# Patient Record
Sex: Male | Born: 1945
Health system: Southern US, Community
[De-identification: ages and names within clinical notes are randomized; demographics above are authoritative.]

## PROBLEM LIST (undated history)

## (undated) DIAGNOSIS — I10 Essential (primary) hypertension: Secondary | ICD-10-CM

## (undated) DIAGNOSIS — F319 Bipolar disorder, unspecified: Secondary | ICD-10-CM

## (undated) DIAGNOSIS — G252 Other specified forms of tremor: Secondary | ICD-10-CM

## (undated) HISTORY — DX: Essential (primary) hypertension: I10

## (undated) HISTORY — PX: TONSILECTOMY/ADENOIDECTOMY WITH MYRINGOTOMY: SHX6125

## (undated) HISTORY — DX: Bipolar disorder, unspecified: F31.9

---

## 2012-01-15 DIAGNOSIS — Z0000 Encounter for general adult medical examination without abnormal findings: Secondary | ICD-10-CM | POA: Diagnosis not present

## 2012-01-15 DIAGNOSIS — I10 Essential (primary) hypertension: Secondary | ICD-10-CM | POA: Diagnosis not present

## 2012-01-15 DIAGNOSIS — Z23 Encounter for immunization: Secondary | ICD-10-CM | POA: Diagnosis not present

## 2012-01-15 DIAGNOSIS — F17200 Nicotine dependence, unspecified, uncomplicated: Secondary | ICD-10-CM | POA: Diagnosis not present

## 2012-01-28 ENCOUNTER — Ambulatory Visit: Payer: Self-pay | Admitting: Family Medicine

## 2012-01-28 DIAGNOSIS — F17200 Nicotine dependence, unspecified, uncomplicated: Secondary | ICD-10-CM | POA: Diagnosis not present

## 2012-01-28 DIAGNOSIS — Z0000 Encounter for general adult medical examination without abnormal findings: Secondary | ICD-10-CM | POA: Diagnosis not present

## 2012-01-28 DIAGNOSIS — I700 Atherosclerosis of aorta: Secondary | ICD-10-CM | POA: Diagnosis not present

## 2012-01-28 DIAGNOSIS — Z136 Encounter for screening for cardiovascular disorders: Secondary | ICD-10-CM | POA: Diagnosis not present

## 2012-07-29 DIAGNOSIS — F3113 Bipolar disorder, current episode manic without psychotic features, severe: Secondary | ICD-10-CM | POA: Diagnosis not present

## 2012-08-13 DIAGNOSIS — Z79899 Other long term (current) drug therapy: Secondary | ICD-10-CM | POA: Diagnosis not present

## 2012-08-13 DIAGNOSIS — F3132 Bipolar disorder, current episode depressed, moderate: Secondary | ICD-10-CM | POA: Diagnosis not present

## 2012-12-14 DIAGNOSIS — Z79899 Other long term (current) drug therapy: Secondary | ICD-10-CM | POA: Diagnosis not present

## 2012-12-30 DIAGNOSIS — F3113 Bipolar disorder, current episode manic without psychotic features, severe: Secondary | ICD-10-CM | POA: Diagnosis not present

## 2013-01-18 DIAGNOSIS — I10 Essential (primary) hypertension: Secondary | ICD-10-CM | POA: Diagnosis not present

## 2013-01-18 DIAGNOSIS — Z0000 Encounter for general adult medical examination without abnormal findings: Secondary | ICD-10-CM | POA: Diagnosis not present

## 2013-01-18 DIAGNOSIS — K047 Periapical abscess without sinus: Secondary | ICD-10-CM | POA: Diagnosis not present

## 2013-01-18 DIAGNOSIS — Z23 Encounter for immunization: Secondary | ICD-10-CM | POA: Diagnosis not present

## 2013-01-18 DIAGNOSIS — Z125 Encounter for screening for malignant neoplasm of prostate: Secondary | ICD-10-CM | POA: Diagnosis not present

## 2013-03-24 DIAGNOSIS — F3113 Bipolar disorder, current episode manic without psychotic features, severe: Secondary | ICD-10-CM | POA: Diagnosis not present

## 2013-07-01 DIAGNOSIS — F3113 Bipolar disorder, current episode manic without psychotic features, severe: Secondary | ICD-10-CM | POA: Diagnosis not present

## 2013-07-21 DIAGNOSIS — I10 Essential (primary) hypertension: Secondary | ICD-10-CM | POA: Diagnosis not present

## 2013-12-29 DIAGNOSIS — Z79899 Other long term (current) drug therapy: Secondary | ICD-10-CM | POA: Diagnosis not present

## 2014-01-13 DIAGNOSIS — F3113 Bipolar disorder, current episode manic without psychotic features, severe: Secondary | ICD-10-CM | POA: Diagnosis not present

## 2014-02-16 DIAGNOSIS — F17210 Nicotine dependence, cigarettes, uncomplicated: Secondary | ICD-10-CM | POA: Diagnosis not present

## 2014-02-16 DIAGNOSIS — Z125 Encounter for screening for malignant neoplasm of prostate: Secondary | ICD-10-CM | POA: Diagnosis not present

## 2014-02-16 DIAGNOSIS — F319 Bipolar disorder, unspecified: Secondary | ICD-10-CM | POA: Diagnosis not present

## 2014-02-16 DIAGNOSIS — I10 Essential (primary) hypertension: Secondary | ICD-10-CM | POA: Diagnosis not present

## 2014-02-16 DIAGNOSIS — K4090 Unilateral inguinal hernia, without obstruction or gangrene, not specified as recurrent: Secondary | ICD-10-CM | POA: Diagnosis not present

## 2014-02-16 DIAGNOSIS — Z23 Encounter for immunization: Secondary | ICD-10-CM | POA: Diagnosis not present

## 2014-02-16 DIAGNOSIS — Z0000 Encounter for general adult medical examination without abnormal findings: Secondary | ICD-10-CM | POA: Diagnosis not present

## 2014-04-14 DIAGNOSIS — F3113 Bipolar disorder, current episode manic without psychotic features, severe: Secondary | ICD-10-CM | POA: Diagnosis not present

## 2014-07-14 DIAGNOSIS — F3113 Bipolar disorder, current episode manic without psychotic features, severe: Secondary | ICD-10-CM | POA: Diagnosis not present

## 2014-09-07 ENCOUNTER — Encounter: Payer: Self-pay | Admitting: Family Medicine

## 2014-09-07 ENCOUNTER — Ambulatory Visit (INDEPENDENT_AMBULATORY_CARE_PROVIDER_SITE_OTHER): Payer: Medicare Other | Admitting: Family Medicine

## 2014-09-07 VITALS — BP 146/91 | HR 83 | Temp 99.9°F | Ht 66.0 in | Wt 136.0 lb

## 2014-09-07 DIAGNOSIS — N182 Chronic kidney disease, stage 2 (mild): Secondary | ICD-10-CM

## 2014-09-07 DIAGNOSIS — N183 Chronic kidney disease, stage 3 unspecified: Secondary | ICD-10-CM | POA: Insufficient documentation

## 2014-09-07 DIAGNOSIS — F3181 Bipolar II disorder: Secondary | ICD-10-CM

## 2014-09-07 DIAGNOSIS — I10 Essential (primary) hypertension: Secondary | ICD-10-CM | POA: Diagnosis not present

## 2014-09-07 MED ORDER — LISINOPRIL 10 MG PO TABS: 10.0000 mg | ORAL_TABLET | Freq: Every day | ORAL | Status: DC

## 2014-09-07 NOTE — Assessment & Plan Note (Signed)
Check BMP .  

## 2014-09-07 NOTE — Progress Notes (Signed)
   BP 146/91 mmHg  Pulse 83  Temp(Src) 99.9 F (37.7 C) (Oral)  Ht 5\' 6"  (1.676 m)  Wt 136 lb (61.689 kg)  BMI 21.96 kg/m2   Subjective:    Patient ID: Tony Fowler, male    DOB: 02/10/1946, 69 y.o.   MRN: [id number]  HPI: Tony CassisHerbert Cortina is a 69 y.o. male presenting on 09/07/2014 for Follow-up; Hypertension; and Chronic Kidney Disease   Hypertension This is a chronic problem. The problem is uncontrolled. Pertinent negatives include no headaches, palpitations, peripheral edema or shortness of breath. Associated agents: worse when smokes.   CKD no c/o doing well takes lithium and bipolar stable followed by psy who does lithium levels  Relevant past medical, surgical, family and social history reviewed and updated as indicated. Interim medical history since our last visit reviewed. Allergies and medications reviewed and updated.  Current Outpatient Prescriptions on File Prior to Visit  Medication Sig  . lisinopril (PRINIVIL,ZESTRIL) 5 MG tablet Take 5 mg by mouth daily.  Marland Kitchen. lithium carbonate 300 MG capsule Take 300 mg by mouth 2 (two) times daily with a meal.  . Multiple Vitamin (MULTIVITAMIN) tablet Take 1 tablet by mouth daily.  . QUEtiapine (SEROQUEL) 100 MG tablet Take 100 mg by mouth at bedtime.   No current facility-administered medications on file prior to visit.    Review of Systems  Respiratory: Negative for shortness of breath.   Cardiovascular: Negative for palpitations.  Neurological: Negative for headaches.    Per HPI unless specifically indicated above     Objective:    BP 146/91 mmHg  Pulse 83  Temp(Src) 99.9 F (37.7 C) (Oral)  Ht 5\' 6"  (1.676 m)  Wt 136 lb (61.689 kg)  BMI 21.96 kg/m2  Wt Readings from Last 3 Encounters:  09/07/14 136 lb (61.689 kg)  02/16/14 134 lb (60.782 kg)    Physical Exam  Constitutional: He is oriented to person, place, and time. He appears well-developed and well-nourished.  Cardiovascular: Regular rhythm and normal heart  sounds.   Pulmonary/Chest: Breath sounds normal.  Neurological: He is alert and oriented to person, place, and time.  Psychiatric: He has a normal mood and affect. His behavior is normal. Judgment and thought content normal.  Vitals reviewed.   No results found for this or any previous visit.    Assessment & Plan:   Problem List Items Addressed This Visit      Cardiovascular and Mediastinum   Benign hypertension - Primary (Chronic)    Will increase lisinopril to 10mg  follow BP and if still high pt will call      Relevant Orders   Basic metabolic panel     Genitourinary   CKD (chronic kidney disease), stage II (Chronic)    Check BMP      Relevant Orders   Basic metabolic panel     Other   Bipolar 2 disorder (Chronic)      Smoking cessation instruction/counseling given  No orders of the defined types were placed in this encounter.    Follow up plan: Return in about 6 months (around 03/09/2015), or if symptoms worsen or fail to improve, for Physical Exam.

## 2014-09-07 NOTE — Assessment & Plan Note (Signed)
Will increase lisinopril to 10mg  follow BP and if still high pt will call

## 2014-09-08 ENCOUNTER — Telehealth: Payer: Self-pay | Admitting: Family Medicine

## 2014-09-08 LAB — BASIC METABOLIC PANEL
BUN / CREAT RATIO: 14 (ref 10–22)
BUN: 17 mg/dL (ref 8–27)
CHLORIDE: 102 mmol/L (ref 97–108)
CO2: 23 mmol/L (ref 18–29)
Calcium: 10 mg/dL (ref 8.6–10.2)
Creatinine, Ser: 1.24 mg/dL (ref 0.76–1.27)
GFR calc non Af Amer: 60 mL/min/{1.73_m2} (ref >59)
GFR, EST AFRICAN AMERICAN: 69 mL/min/{1.73_m2} (ref >59)
Glucose: 108 mg/dL — ABNORMAL HIGH (ref 65–99)
POTASSIUM: 4.8 mmol/L (ref 3.5–5.2)
SODIUM: 139 mmol/L (ref 134–144)

## 2014-09-08 NOTE — Telephone Encounter (Signed)
erroneous

## 2014-10-20 DIAGNOSIS — F331 Major depressive disorder, recurrent, moderate: Secondary | ICD-10-CM | POA: Diagnosis not present

## 2014-11-09 ENCOUNTER — Telehealth: Payer: Self-pay | Admitting: Family Medicine

## 2014-11-09 NOTE — Telephone Encounter (Signed)
bp was  Average 129/77 for the last 8 weeks and he wanted to let dr Dossie Arbour know his medication was working well.

## 2014-12-19 ENCOUNTER — Telehealth: Payer: Self-pay | Admitting: Family Medicine

## 2014-12-19 MED ORDER — LISINOPRIL 10 MG PO TABS: 10.0000 mg | ORAL_TABLET | Freq: Every day | ORAL | Status: DC

## 2014-12-19 NOTE — Telephone Encounter (Signed)
Rx sent to his pharmacy

## 2014-12-19 NOTE — Telephone Encounter (Signed)
Patient has CPE in November with MAC

## 2014-12-19 NOTE — Telephone Encounter (Signed)
Pt would like a refill on lisinorpil sent to [facility] 

## 2014-12-21 DIAGNOSIS — F3113 Bipolar disorder, current episode manic without psychotic features, severe: Secondary | ICD-10-CM | POA: Diagnosis not present

## 2015-01-19 DIAGNOSIS — F331 Major depressive disorder, recurrent, moderate: Secondary | ICD-10-CM | POA: Diagnosis not present

## 2015-02-22 ENCOUNTER — Ambulatory Visit (INDEPENDENT_AMBULATORY_CARE_PROVIDER_SITE_OTHER): Payer: Medicare Other | Admitting: Family Medicine

## 2015-02-22 ENCOUNTER — Encounter: Payer: Self-pay | Admitting: Family Medicine

## 2015-02-22 VITALS — BP 134/85 | HR 81 | Temp 98.2°F | Ht 66.6 in | Wt 136.0 lb

## 2015-02-22 DIAGNOSIS — Z0000 Encounter for general adult medical examination without abnormal findings: Secondary | ICD-10-CM

## 2015-02-22 DIAGNOSIS — Z23 Encounter for immunization: Secondary | ICD-10-CM

## 2015-02-22 DIAGNOSIS — N400 Enlarged prostate without lower urinary tract symptoms: Secondary | ICD-10-CM

## 2015-02-22 DIAGNOSIS — F319 Bipolar disorder, unspecified: Secondary | ICD-10-CM

## 2015-02-22 DIAGNOSIS — I10 Essential (primary) hypertension: Secondary | ICD-10-CM

## 2015-02-22 DIAGNOSIS — N138 Other obstructive and reflux uropathy: Secondary | ICD-10-CM | POA: Insufficient documentation

## 2015-02-22 DIAGNOSIS — N401 Enlarged prostate with lower urinary tract symptoms: Secondary | ICD-10-CM

## 2015-02-22 LAB — URINALYSIS, ROUTINE W REFLEX MICROSCOPIC
BILIRUBIN UA: NEGATIVE
Glucose, UA: NEGATIVE
Ketones, UA: NEGATIVE
Leukocytes, UA: NEGATIVE
NITRITE UA: NEGATIVE
PH UA: 6 (ref 5.0–7.5)
Protein, UA: NEGATIVE
RBC UA: NEGATIVE
UUROB: 0.2 mg/dL (ref 0.2–1.0)

## 2015-02-22 MED ORDER — LISINOPRIL 10 MG PO TABS: 10.0000 mg | ORAL_TABLET | Freq: Every day | ORAL | Status: DC

## 2015-02-22 NOTE — Assessment & Plan Note (Signed)
The current medical regimen is effective;  continue present plan and medications.  

## 2015-02-22 NOTE — Assessment & Plan Note (Signed)
Followed by psychiatry 

## 2015-02-22 NOTE — Progress Notes (Signed)
BP 134/85 mmHg  Pulse 81  Temp(Src) 98.2 F (36.8 C)  Ht 5' 6.6" (1.692 m)  Wt 136 lb (61.689 kg)  BMI 21.55 kg/m2  SpO2 99%   Subjective:    Patient ID: Tony Fowler, male    DOB: 06/28/1945, 69 y.o.   MRN: [id number]  HPI: Tony Fowler is a 69 y.o. male  Chief Complaint  Patient presents with  . Annual Exam   patient doing well nerves doing well managed by psychiatry, lithium levels were okay needs blood work to monitor renal function Taking lisinopril for blood pressure with good control no issues no side effects  Relevant past medical, surgical, family and social history reviewed and updated as indicated. Interim medical history since our last visit reviewed. Allergies and medications reviewed and updated.  Review of Systems  Constitutional: Negative.   HENT: Negative.   Eyes: Negative.   Respiratory: Negative.   Cardiovascular: Negative.   Gastrointestinal: Negative.   Endocrine: Negative.   Genitourinary: Negative.   Musculoskeletal: Negative.   Skin: Negative.   Allergic/Immunologic: Negative.   Neurological: Negative.   Hematological: Negative.   Psychiatric/Behavioral: Negative.     Per HPI unless specifically indicated above     Objective:    BP 134/85 mmHg  Pulse 81  Temp(Src) 98.2 F (36.8 C)  Ht 5' 6.6" (1.692 m)  Wt 136 lb (61.689 kg)  BMI 21.55 kg/m2  SpO2 99%  Wt Readings from Last 3 Encounters:  02/22/15 136 lb (61.689 kg)  09/07/14 136 lb (61.689 kg)  02/16/14 134 lb (60.782 kg)    Physical Exam  Constitutional: He is oriented to person, place, and time. He appears well-developed and well-nourished.  HENT:  Head: Normocephalic and atraumatic.  Right Ear: External ear normal.  Left Ear: External ear normal.  Eyes: Conjunctivae and EOM are normal. Pupils are equal, round, and reactive to light.  Neck: Normal range of motion. Neck supple.  Cardiovascular: Normal rate, regular rhythm, normal heart sounds and intact distal pulses.    Pulmonary/Chest: Effort normal and breath sounds normal.  Abdominal: Soft. Bowel sounds are normal. There is no splenomegaly or hepatomegaly.  Genitourinary: Rectum normal and penis normal.  Giant right inguinal hernia Enlarged prostate  Musculoskeletal: Normal range of motion.  Neurological: He is alert and oriented to person, place, and time. He has normal reflexes.  Skin: No rash noted. No erythema.  Psychiatric: He has a normal mood and affect. His behavior is normal. Judgment and thought content normal.    Results for orders placed or performed in visit on 09/07/14  Basic metabolic panel  Result Value Ref Range   Glucose 108 (H) 65 - 99 mg/dL   BUN 17 8 - 27 mg/dL   Creatinine, Ser 4.091.24 0.76 - 1.27 mg/dL   GFR calc non Af Amer 60 >59 mL/min/1.73   GFR calc Af Amer 69 >59 mL/min/1.73   BUN/Creatinine Ratio 14 10 - 22   Sodium 139 134 - 144 mmol/L   Potassium 4.8 3.5 - 5.2 mmol/L   Chloride 102 97 - 108 mmol/L   CO2 23 18 - 29 mmol/L   Calcium 10.0 8.6 - 10.2 mg/dL      Assessment & Plan:   Problem List Items Addressed This Visit      Cardiovascular and Mediastinum   Benign hypertension (Chronic)    The current medical regimen is effective;  continue present plan and medications.       Relevant Medications   lisinopril (PRINIVIL,ZESTRIL)  10 MG tablet   Other Relevant Orders   Comprehensive metabolic panel   Lipid panel   CBC with Differential/Platelet   TSH     Genitourinary   BPH (benign prostatic hyperplasia)   Relevant Orders   PSA   Urinalysis, Routine w reflex microscopic (not at [facility])   TSH     Other   Bipolar 1 disorder (HCC)    Followed by psychiatry      Relevant Orders   Comprehensive metabolic panel   Lipid panel   CBC with Differential/Platelet   TSH    Other Visit Diagnoses    Immunization due    -  Primary    Relevant Orders    Flu Vaccine QUAD 36+ mos PF IM (Fluarix & Fluzone Quad PF) (Completed)    Pneumococcal conjugate vaccine  13-valent IM (Completed)    PE (physical exam), annual            Follow up plan: Return in about 6 months (around 08/22/2015) for Medicine check, BMP, lithium level.

## 2015-02-23 ENCOUNTER — Telehealth: Payer: Self-pay | Admitting: Family Medicine

## 2015-02-23 ENCOUNTER — Encounter: Payer: Self-pay | Admitting: Family Medicine

## 2015-02-23 DIAGNOSIS — R972 Elevated prostate specific antigen [PSA]: Secondary | ICD-10-CM

## 2015-02-23 LAB — COMPREHENSIVE METABOLIC PANEL
A/G RATIO: 1.7 (ref 1.1–2.5)
ALT: 24 IU/L (ref 0–44)
AST: 27 IU/L (ref 0–40)
Albumin: 4.3 g/dL (ref 3.6–4.8)
Alkaline Phosphatase: 82 IU/L (ref 39–117)
BUN/Creatinine Ratio: 15 (ref 10–22)
BUN: 20 mg/dL (ref 8–27)
Bilirubin Total: 0.4 mg/dL (ref 0.0–1.2)
CALCIUM: 10 mg/dL (ref 8.6–10.2)
CO2: 23 mmol/L (ref 18–29)
Chloride: 101 mmol/L (ref 97–106)
Creatinine, Ser: 1.37 mg/dL — ABNORMAL HIGH (ref 0.76–1.27)
GFR calc Af Amer: 61 mL/min/{1.73_m2} (ref >59)
GFR, EST NON AFRICAN AMERICAN: 53 mL/min/{1.73_m2} — AB (ref >59)
Globulin, Total: 2.5 g/dL (ref 1.5–4.5)
Glucose: 101 mg/dL — ABNORMAL HIGH (ref 65–99)
Potassium: 4.8 mmol/L (ref 3.5–5.2)
Sodium: 137 mmol/L (ref 136–144)
TOTAL PROTEIN: 6.8 g/dL (ref 6.0–8.5)

## 2015-02-23 LAB — CBC WITH DIFFERENTIAL/PLATELET
Basophils Absolute: 0 10*3/uL (ref 0.0–0.2)
Basos: 0 %
EOS (ABSOLUTE): 0.3 10*3/uL (ref 0.0–0.4)
Eos: 3 %
Hematocrit: 43.7 % (ref 37.5–51.0)
Hemoglobin: 14.4 g/dL (ref 12.6–17.7)
Immature Grans (Abs): 0 10*3/uL (ref 0.0–0.1)
Immature Granulocytes: 0 %
Lymphocytes Absolute: 2.2 10*3/uL (ref 0.7–3.1)
Lymphs: 22 %
MCH: 31.3 pg (ref 26.6–33.0)
MCHC: 33 g/dL (ref 31.5–35.7)
MCV: 95 fL (ref 79–97)
Monocytes Absolute: 0.8 10*3/uL (ref 0.1–0.9)
Monocytes: 8 %
Neutrophils Absolute: 6.7 10*3/uL (ref 1.4–7.0)
Neutrophils: 67 %
Platelets: 279 10*3/uL (ref 150–379)
RBC: 4.6 x10E6/uL (ref 4.14–5.80)
RDW: 13 % (ref 12.3–15.4)
WBC: 10 10*3/uL (ref 3.4–10.8)

## 2015-02-23 LAB — LIPID PANEL
CHOL/HDL RATIO: 3.7 ratio (ref 0.0–5.0)
Cholesterol, Total: 205 mg/dL — ABNORMAL HIGH (ref 100–199)
HDL: 56 mg/dL (ref >39)
LDL CALC: 124 mg/dL — AB (ref 0–99)
Triglycerides: 125 mg/dL (ref 0–149)
VLDL Cholesterol Cal: 25 mg/dL (ref 5–40)

## 2015-02-23 LAB — TSH: TSH: 2.12 u[IU]/mL (ref 0.450–4.500)

## 2015-02-23 LAB — PSA: Prostate Specific Ag, Serum: 5.3 ng/mL — ABNORMAL HIGH (ref 0.0–4.0)

## 2015-02-23 NOTE — Telephone Encounter (Signed)
Phone call Discussed with patient elevated PSA Will recheck PSA 2 months or so if still elevated consider urology referral.

## 2015-02-23 NOTE — Telephone Encounter (Signed)
Pt would like a call back regarding lab results. 

## 2015-02-23 NOTE — Telephone Encounter (Signed)
-----   Message from Lurlean HornsNancy H Wilson, CMA sent at 02/23/2015  4:31 PM EST ----- labs

## 2015-04-20 DIAGNOSIS — F3113 Bipolar disorder, current episode manic without psychotic features, severe: Secondary | ICD-10-CM | POA: Diagnosis not present

## 2015-05-31 ENCOUNTER — Encounter: Payer: Self-pay | Admitting: Family Medicine

## 2015-05-31 ENCOUNTER — Ambulatory Visit (INDEPENDENT_AMBULATORY_CARE_PROVIDER_SITE_OTHER): Payer: Medicare Other | Admitting: Family Medicine

## 2015-05-31 VITALS — BP 116/75 | HR 85 | Temp 99.0°F | Ht 65.7 in | Wt 138.0 lb

## 2015-05-31 DIAGNOSIS — I10 Essential (primary) hypertension: Secondary | ICD-10-CM | POA: Diagnosis not present

## 2015-05-31 DIAGNOSIS — R972 Elevated prostate specific antigen [PSA]: Secondary | ICD-10-CM

## 2015-05-31 DIAGNOSIS — Z5181 Encounter for therapeutic drug level monitoring: Secondary | ICD-10-CM

## 2015-05-31 NOTE — Progress Notes (Signed)
BP 116/75 mmHg  Pulse 85  Temp(Src) 99 F (37.2 C)  Ht 5' 5.7" (1.669 m)  Wt 138 lb (62.596 kg)  BMI 22.47 kg/m2  SpO2 99%   Subjective:    Patient ID: Tony Fowler, male    DOB: 06/20/1945, 70 y.o.   MRN: [id number]  HPI: Tony Fowler is a 70 y.o. male  Chief Complaint  Patient presents with  . Hypertension  . medication monitoring    Lithium level  . recheck of PSA   patient doing well with no complaints from medications taking lisinopril without problems and blood pressure doing well. On review patient not do lithium level until May will cancel order Patient for also follow-up PSA which was elevated at physical patient with no lower urinary tract symptoms at all no BPH symptoms no blood in urine  Relevant past medical, surgical, family and social history reviewed and updated as indicated. Interim medical history since our last visit reviewed. Allergies and medications reviewed and updated.  Review of Systems  Constitutional: Negative.   Respiratory: Negative.   Cardiovascular: Negative.     Per HPI unless specifically indicated above     Objective:    BP 116/75 mmHg  Pulse 85  Temp(Src) 99 F (37.2 C)  Ht 5' 5.7" (1.669 m)  Wt 138 lb (62.596 kg)  BMI 22.47 kg/m2  SpO2 99%  Wt Readings from Last 3 Encounters:  05/31/15 138 lb (62.596 kg)  02/22/15 136 lb (61.689 kg)  09/07/14 136 lb (61.689 kg)    Physical Exam  Constitutional: He is oriented to person, place, and time. He appears well-developed and well-nourished. No distress.  HENT:  Head: Normocephalic and atraumatic.  Right Ear: Hearing normal.  Left Ear: Hearing normal.  Nose: Nose normal.  Eyes: Conjunctivae and lids are normal. Right eye exhibits no discharge. Left eye exhibits no discharge. No scleral icterus.  Cardiovascular: Normal rate, regular rhythm and normal heart sounds.   Pulmonary/Chest: Effort normal and breath sounds normal. No respiratory distress.  Musculoskeletal: Normal  range of motion.  Neurological: He is alert and oriented to person, place, and time.  Skin: Skin is intact. No rash noted.  Psychiatric: He has a normal mood and affect. His speech is normal and behavior is normal. Judgment and thought content normal. Cognition and memory are normal.    Results for orders placed or performed in visit on 02/22/15  Comprehensive metabolic panel  Result Value Ref Range   Glucose 101 (H) 65 - 99 mg/dL   BUN 20 8 - 27 mg/dL   Creatinine, Ser 4.09 (H) 0.76 - 1.27 mg/dL   GFR calc non Af Amer 53 (L) >59 mL/min/1.73   GFR calc Af Amer 61 >59 mL/min/1.73   BUN/Creatinine Ratio 15 10 - 22   Sodium 137 136 - 144 mmol/L   Potassium 4.8 3.5 - 5.2 mmol/L   Chloride 101 97 - 106 mmol/L   CO2 23 18 - 29 mmol/L   Calcium 10.0 8.6 - 10.2 mg/dL   Total Protein 6.8 6.0 - 8.5 g/dL   Albumin 4.3 3.6 - 4.8 g/dL   Globulin, Total 2.5 1.5 - 4.5 g/dL   Albumin/Globulin Ratio 1.7 1.1 - 2.5   Bilirubin Total 0.4 0.0 - 1.2 mg/dL   Alkaline Phosphatase 82 39 - 117 IU/L   AST 27 0 - 40 IU/L   ALT 24 0 - 44 IU/L  Lipid panel  Result Value Ref Range   Cholesterol, Total 205 (H) 100 -  199 mg/dL   Triglycerides 161 0 - 149 mg/dL   HDL 56 >09 mg/dL   VLDL Cholesterol Cal 25 5 - 40 mg/dL   LDL Calculated 604 (H) 0 - 99 mg/dL   Chol/HDL Ratio 3.7 0.0 - 5.0 ratio units  CBC with Differential/Platelet  Result Value Ref Range   WBC 10.0 3.4 - 10.8 x10E3/uL   RBC 4.60 4.14 - 5.80 x10E6/uL   Hemoglobin 14.4 12.6 - 17.7 g/dL   Hematocrit 54.0 98.1 - 51.0 %   MCV 95 79 - 97 fL   MCH 31.3 26.6 - 33.0 pg   MCHC 33.0 31.5 - 35.7 g/dL   RDW 19.1 47.8 - 29.5 %   Platelets 279 150 - 379 x10E3/uL   Neutrophils 67 %   Lymphs 22 %   Monocytes 8 %   Eos 3 %   Basos 0 %   Neutrophils Absolute 6.7 1.4 - 7.0 x10E3/uL   Lymphocytes Absolute 2.2 0.7 - 3.1 x10E3/uL   Monocytes Absolute 0.8 0.1 - 0.9 x10E3/uL   EOS (ABSOLUTE) 0.3 0.0 - 0.4 x10E3/uL   Basophils Absolute 0.0 0.0 - 0.2  x10E3/uL   Immature Granulocytes 0 %   Immature Grans (Abs) 0.0 0.0 - 0.1 x10E3/uL  PSA  Result Value Ref Range   Prostate Specific Ag, Serum 5.3 (H) 0.0 - 4.0 ng/mL  Urinalysis, Routine w reflex microscopic (not at [facility])  Result Value Ref Range   Specific Gravity, UA <1.005 (L) 1.005 - 1.030   pH, UA 6.0 5.0 - 7.5   Color, UA Yellow Yellow   Appearance Ur Clear Clear   Leukocytes, UA Negative Negative   Protein, UA Negative Negative/Trace   Glucose, UA Negative Negative   Ketones, UA Negative Negative   RBC, UA Negative Negative   Bilirubin, UA Negative Negative   Urobilinogen, Ur 0.2 0.2 - 1.0 mg/dL   Nitrite, UA Negative Negative  TSH  Result Value Ref Range   TSH 2.120 0.450 - 4.500 uIU/mL      Assessment & Plan:   Problem List Items Addressed This Visit      Cardiovascular and Mediastinum   Benign hypertension (Chronic)   Relevant Orders   Basic metabolic panel    Other Visit Diagnoses    Medication monitoring encounter    -  Primary    Elevated PSA            Follow up plan: Return for Physical Exam May and lithium level.

## 2015-06-01 ENCOUNTER — Encounter: Payer: Self-pay | Admitting: Family Medicine

## 2015-06-01 LAB — BASIC METABOLIC PANEL
BUN / CREAT RATIO: 16 (ref 10–22)
BUN: 22 mg/dL (ref 8–27)
CALCIUM: 9.9 mg/dL (ref 8.6–10.2)
CHLORIDE: 101 mmol/L (ref 96–106)
CO2: 23 mmol/L (ref 18–29)
Creatinine, Ser: 1.39 mg/dL — ABNORMAL HIGH (ref 0.76–1.27)
GFR, EST AFRICAN AMERICAN: 60 mL/min/{1.73_m2} (ref >59)
GFR, EST NON AFRICAN AMERICAN: 52 mL/min/{1.73_m2} — AB (ref >59)
Glucose: 102 mg/dL — ABNORMAL HIGH (ref 65–99)
POTASSIUM: 5.1 mmol/L (ref 3.5–5.2)
SODIUM: 138 mmol/L (ref 134–144)

## 2015-06-01 LAB — PSA: PROSTATE SPECIFIC AG, SERUM: 3.1 ng/mL (ref 0.0–4.0)

## 2015-07-13 DIAGNOSIS — F3113 Bipolar disorder, current episode manic without psychotic features, severe: Secondary | ICD-10-CM | POA: Diagnosis not present

## 2015-08-23 ENCOUNTER — Ambulatory Visit (INDEPENDENT_AMBULATORY_CARE_PROVIDER_SITE_OTHER): Payer: Medicare Other | Admitting: Family Medicine

## 2015-08-23 ENCOUNTER — Encounter: Payer: Self-pay | Admitting: Family Medicine

## 2015-08-23 VITALS — BP 137/80 | HR 86 | Temp 97.9°F | Ht 65.7 in | Wt 140.0 lb

## 2015-08-23 DIAGNOSIS — N183 Chronic kidney disease, stage 3 unspecified: Secondary | ICD-10-CM

## 2015-08-23 DIAGNOSIS — I10 Essential (primary) hypertension: Secondary | ICD-10-CM

## 2015-08-23 DIAGNOSIS — F319 Bipolar disorder, unspecified: Secondary | ICD-10-CM

## 2015-08-23 DIAGNOSIS — Z5181 Encounter for therapeutic drug level monitoring: Secondary | ICD-10-CM | POA: Diagnosis not present

## 2015-08-23 NOTE — Progress Notes (Signed)
BP 137/80 mmHg  Pulse 86  Temp(Src) 97.9 F (36.6 C)  Ht 5' 5.7" (1.669 m)  Wt 140 lb (63.504 kg)  BMI 22.80 kg/m2  SpO2 98%   Subjective:    Patient ID: Tony Fowler, male    DOB: Oct 31, 1945, 70 y.o.   MRN: [id number]  HPI: Tony Fowler is a 70 y.o. male  Chief Complaint  Patient presents with  . Manic Behavior  . Hypertension   Patient with bipolar 1 all in all doing well sleeping okay not cycling is looking at possibly reducing lithium because of's CK D and lithium and Seroquel managed by psychiatry. Needs to have limited BMP done because of concerns about Medicare payment and denial of labs. Wants to monitor CK D lithium levels and hypertension will limit testing to creatinine is at's been slowly creeping up. Also check lithium levels. Blood pressures been doing well no complaints from medications Relevant past medical, surgical, family and social history reviewed and updated as indicated. Interim medical history since our last visit reviewed. Allergies and medications reviewed and updated.  Review of Systems  Constitutional: Negative.   Respiratory: Negative.   Cardiovascular: Negative.     Per HPI unless specifically indicated above     Objective:    BP 137/80 mmHg  Pulse 86  Temp(Src) 97.9 F (36.6 C)  Ht 5' 5.7" (1.669 m)  Wt 140 lb (63.504 kg)  BMI 22.80 kg/m2  SpO2 98%  Wt Readings from Last 3 Encounters:  08/23/15 140 lb (63.504 kg)  05/31/15 138 lb (62.596 kg)  02/22/15 136 lb (61.689 kg)    Physical Exam  Constitutional: He is oriented to person, place, and time. He appears well-developed and well-nourished. No distress.  HENT:  Head: Normocephalic and atraumatic.  Right Ear: Hearing normal.  Left Ear: Hearing normal.  Nose: Nose normal.  Eyes: Conjunctivae and lids are normal. Right eye exhibits no discharge. Left eye exhibits no discharge. No scleral icterus.  Cardiovascular: Normal rate, regular rhythm and normal heart sounds.    Pulmonary/Chest: Effort normal and breath sounds normal. No respiratory distress.  Musculoskeletal: Normal range of motion.  Neurological: He is alert and oriented to person, place, and time.  Skin: Skin is intact. No rash noted.  Psychiatric: He has a normal mood and affect. His speech is normal and behavior is normal. Judgment and thought content normal. Cognition and memory are normal.    Results for orders placed or performed in visit on 05/31/15  PSA  Result Value Ref Range   Prostate Specific Ag, Serum 3.1 0.0 - 4.0 ng/mL  Basic metabolic panel  Result Value Ref Range   Glucose 102 (H) 65 - 99 mg/dL   BUN 22 8 - 27 mg/dL   Creatinine, Ser 2.95 (H) 0.76 - 1.27 mg/dL   GFR calc non Af Amer 52 (L) >59 mL/min/1.73   GFR calc Af Amer 60 >59 mL/min/1.73   BUN/Creatinine Ratio 16 10 - 22   Sodium 138 134 - 144 mmol/L   Potassium 5.1 3.5 - 5.2 mmol/L   Chloride 101 96 - 106 mmol/L   CO2 23 18 - 29 mmol/L   Calcium 9.9 8.6 - 10.2 mg/dL      Assessment & Plan:   Problem List Items Addressed This Visit      Cardiovascular and Mediastinum   Benign hypertension - Primary (Chronic)    The current medical regimen is effective;  continue present plan and medications.  Relevant Orders   Lithium level   Creatinine     Genitourinary   CKD (chronic kidney disease), stage III    Need to determine stability of renal function with nephrotoxic drugs and hypertension declining function.      Relevant Orders   Lithium level   Creatinine     Other   Bipolar 1 disorder (HCC)    Stable management psychiatry concerned about CK D needs therapeutic drug monitoring for both lithium and renal function      Relevant Orders   Lithium level   Creatinine    Other Visit Diagnoses    Medication monitoring encounter        Relevant Orders    Lithium level    Creatinine        Follow up plan: No Follow-up on file.

## 2015-08-23 NOTE — Assessment & Plan Note (Signed)
Need to determine stability of renal function with nephrotoxic drugs and hypertension declining function.

## 2015-08-23 NOTE — Assessment & Plan Note (Signed)
Stable management psychiatry concerned about CK D needs therapeutic drug monitoring for both lithium and renal function

## 2015-08-23 NOTE — Assessment & Plan Note (Signed)
The current medical regimen is effective;  continue present plan and medications.  

## 2015-08-24 ENCOUNTER — Telehealth: Payer: Self-pay | Admitting: Family Medicine

## 2015-08-24 DIAGNOSIS — N183 Chronic kidney disease, stage 3 unspecified: Secondary | ICD-10-CM

## 2015-08-24 LAB — CREATININE, SERUM
CREATININE: 1.49 mg/dL — AB (ref 0.76–1.27)
GFR, EST AFRICAN AMERICAN: 55 mL/min/{1.73_m2} — AB (ref >59)
GFR, EST NON AFRICAN AMERICAN: 48 mL/min/{1.73_m2} — AB (ref >59)

## 2015-08-24 LAB — LITHIUM LEVEL: LITHIUM LVL: 0.8 mmol/L (ref 0.6–1.2)

## 2015-08-24 NOTE — Telephone Encounter (Signed)
Phone call Discussed with patient lithium levels therapeutic but declining renal function and developing CK D. Will need to refer to nephrology for preservation of renal status. Patient will discuss with his psychiatrist about lithium.

## 2015-09-27 DIAGNOSIS — I10 Essential (primary) hypertension: Secondary | ICD-10-CM | POA: Diagnosis not present

## 2015-09-27 DIAGNOSIS — N183 Chronic kidney disease, stage 3 (moderate): Secondary | ICD-10-CM | POA: Diagnosis not present

## 2015-10-12 DIAGNOSIS — F3113 Bipolar disorder, current episode manic without psychotic features, severe: Secondary | ICD-10-CM | POA: Diagnosis not present

## 2015-11-13 ENCOUNTER — Telehealth: Payer: Self-pay | Admitting: Family Medicine

## 2015-11-13 ENCOUNTER — Other Ambulatory Visit: Payer: Self-pay | Admitting: Family Medicine

## 2015-11-13 MED ORDER — QUETIAPINE FUMARATE 300 MG PO TABS: 300.0000 mg | ORAL_TABLET | Freq: Every day | ORAL | 2 refills | Status: DC

## 2015-11-13 NOTE — Telephone Encounter (Signed)
Pt's daughter called stated she is really worried about her dad. Stated to please call her ASAP. Thanks.

## 2015-11-13 NOTE — Telephone Encounter (Signed)
Pts daughter called stating [name] has taken himself off of one of his psyche meds and is not doing so well.  She would like to talk to Dr Dossie Arbourrissman about what can be done.

## 2015-11-13 NOTE — Telephone Encounter (Signed)
Phone call with daughter who is caregiver Patient with concern about renal status has stopped lithium and has deteriorated into bipolar manic stage and is getting more and more agitated spending money and getting very agitated and combative. Family is trying to avoid commitment. Discuss increasing Seroquel to 300 mg every 3 days Psychiatric consult as soon as possible In follow-up if getting worse.

## 2015-11-16 DIAGNOSIS — F3113 Bipolar disorder, current episode manic without psychotic features, severe: Secondary | ICD-10-CM | POA: Diagnosis not present

## 2015-11-22 DIAGNOSIS — F3113 Bipolar disorder, current episode manic without psychotic features, severe: Secondary | ICD-10-CM | POA: Diagnosis not present

## 2015-12-01 ENCOUNTER — Encounter: Payer: Self-pay | Admitting: Emergency Medicine

## 2015-12-01 ENCOUNTER — Encounter: Payer: Self-pay | Admitting: Psychiatry

## 2015-12-01 ENCOUNTER — Inpatient Hospital Stay
Admission: RE | Admit: 2015-12-01 | Discharge: 2015-12-14 | DRG: 885 | Disposition: A | Discharge status: Home / Self Care | Payer: Medicare Other | Source: Intra-hospital | Attending: Psychiatry | Admitting: Psychiatry

## 2015-12-01 ENCOUNTER — Emergency Department
Admission: EM | Admit: 2015-12-01 | Discharge: 2015-12-01 | Disposition: A | Discharge status: Other Acute Inpatient Hospital | Payer: Medicare Other | Attending: Emergency Medicine | Admitting: Emergency Medicine

## 2015-12-01 DIAGNOSIS — N183 Chronic kidney disease, stage 3 unspecified: Secondary | ICD-10-CM | POA: Diagnosis present

## 2015-12-01 DIAGNOSIS — F17210 Nicotine dependence, cigarettes, uncomplicated: Secondary | ICD-10-CM | POA: Diagnosis present

## 2015-12-01 DIAGNOSIS — F3112 Bipolar disorder, current episode manic without psychotic features, moderate: Secondary | ICD-10-CM | POA: Diagnosis present

## 2015-12-01 DIAGNOSIS — F17200 Nicotine dependence, unspecified, uncomplicated: Secondary | ICD-10-CM | POA: Diagnosis present

## 2015-12-01 DIAGNOSIS — Z046 Encounter for general psychiatric examination, requested by authority: Secondary | ICD-10-CM | POA: Diagnosis present

## 2015-12-01 DIAGNOSIS — F3110 Bipolar disorder, current episode manic without psychotic features, unspecified: Secondary | ICD-10-CM | POA: Diagnosis not present

## 2015-12-01 DIAGNOSIS — Z9114 Patient's other noncompliance with medication regimen: Secondary | ICD-10-CM | POA: Diagnosis not present

## 2015-12-01 DIAGNOSIS — F3162 Bipolar disorder, current episode mixed, moderate: Secondary | ICD-10-CM | POA: Diagnosis not present

## 2015-12-01 DIAGNOSIS — Z9889 Other specified postprocedural states: Secondary | ICD-10-CM

## 2015-12-01 DIAGNOSIS — Z818 Family history of other mental and behavioral disorders: Secondary | ICD-10-CM

## 2015-12-01 DIAGNOSIS — I129 Hypertensive chronic kidney disease with stage 1 through stage 4 chronic kidney disease, or unspecified chronic kidney disease: Secondary | ICD-10-CM | POA: Insufficient documentation

## 2015-12-01 DIAGNOSIS — F312 Bipolar disorder, current episode manic severe with psychotic features: Secondary | ICD-10-CM | POA: Diagnosis not present

## 2015-12-01 DIAGNOSIS — Z79899 Other long term (current) drug therapy: Secondary | ICD-10-CM | POA: Diagnosis not present

## 2015-12-01 DIAGNOSIS — Z880 Allergy status to penicillin: Secondary | ICD-10-CM

## 2015-12-01 DIAGNOSIS — Z8249 Family history of ischemic heart disease and other diseases of the circulatory system: Secondary | ICD-10-CM

## 2015-12-01 DIAGNOSIS — I10 Essential (primary) hypertension: Secondary | ICD-10-CM | POA: Diagnosis present

## 2015-12-01 DIAGNOSIS — Z01810 Encounter for preprocedural cardiovascular examination: Secondary | ICD-10-CM | POA: Diagnosis not present

## 2015-12-01 DIAGNOSIS — F319 Bipolar disorder, unspecified: Secondary | ICD-10-CM | POA: Diagnosis not present

## 2015-12-01 DIAGNOSIS — Z87891 Personal history of nicotine dependence: Secondary | ICD-10-CM | POA: Diagnosis present

## 2015-12-01 LAB — URINALYSIS COMPLETE WITH MICROSCOPIC (ARMC ONLY)
Bacteria, UA: NONE SEEN
Bilirubin Urine: NEGATIVE
Glucose, UA: NEGATIVE mg/dL
KETONES UR: NEGATIVE mg/dL
Nitrite: NEGATIVE
PH: 7 (ref 5.0–8.0)
PROTEIN: NEGATIVE mg/dL
SPECIFIC GRAVITY, URINE: 1.008 (ref 1.005–1.030)
SQUAMOUS EPITHELIAL / LPF: NONE SEEN

## 2015-12-01 LAB — VALPROIC ACID LEVEL: Valproic Acid Lvl: 65 ug/mL (ref 50.0–100.0)

## 2015-12-01 LAB — CBC
HEMATOCRIT: 42.2 % (ref 40.0–52.0)
HEMOGLOBIN: 14.6 g/dL (ref 13.0–18.0)
MCH: 31.9 pg (ref 26.0–34.0)
MCHC: 34.6 g/dL (ref 32.0–36.0)
MCV: 92.1 fL (ref 80.0–100.0)
Platelets: 247 10*3/uL (ref 150–440)
RBC: 4.58 MIL/uL (ref 4.40–5.90)
RDW: 13.4 % (ref 11.5–14.5)
WBC: 12 10*3/uL — AB (ref 3.8–10.6)

## 2015-12-01 LAB — COMPREHENSIVE METABOLIC PANEL
ALBUMIN: 3.8 g/dL (ref 3.5–5.0)
ALT: 17 U/L (ref 17–63)
AST: 23 U/L (ref 15–41)
Alkaline Phosphatase: 64 U/L (ref 38–126)
Anion gap: 7 (ref 5–15)
BUN: 18 mg/dL (ref 6–20)
CHLORIDE: 106 mmol/L (ref 101–111)
CO2: 25 mmol/L (ref 22–32)
Calcium: 9.7 mg/dL (ref 8.9–10.3)
Creatinine, Ser: 1.21 mg/dL (ref 0.61–1.24)
GFR calc Af Amer: >60 mL/min (ref >60)
GFR, EST NON AFRICAN AMERICAN: 60 mL/min — AB (ref >60)
Glucose, Bld: 155 mg/dL — ABNORMAL HIGH (ref 65–99)
POTASSIUM: 4.8 mmol/L (ref 3.5–5.1)
SODIUM: 138 mmol/L (ref 135–145)
Total Bilirubin: 0.3 mg/dL (ref 0.3–1.2)
Total Protein: 6.8 g/dL (ref 6.5–8.1)

## 2015-12-01 LAB — LIPID PANEL
CHOLESTEROL: 191 mg/dL (ref 0–200)
HDL: 46 mg/dL (ref >40)
LDL Cholesterol: 107 mg/dL — ABNORMAL HIGH (ref 0–99)
TRIGLYCERIDES: 189 mg/dL — AB (ref <150)
Total CHOL/HDL Ratio: 4.2 RATIO
VLDL: 38 mg/dL (ref 0–40)

## 2015-12-01 LAB — TSH: TSH: 0.2 u[IU]/mL — ABNORMAL LOW (ref 0.350–4.500)

## 2015-12-01 LAB — URINE DRUG SCREEN, QUALITATIVE (ARMC ONLY)
AMPHETAMINES, UR SCREEN: NOT DETECTED
BENZODIAZEPINE, UR SCRN: NOT DETECTED
Barbiturates, Ur Screen: NOT DETECTED
CANNABINOID 50 NG, UR ~~LOC~~: NOT DETECTED
Cocaine Metabolite,Ur ~~LOC~~: NOT DETECTED
MDMA (Ecstasy)Ur Screen: NOT DETECTED
Methadone Scn, Ur: NOT DETECTED
Opiate, Ur Screen: NOT DETECTED
PHENCYCLIDINE (PCP) UR S: NOT DETECTED
Tricyclic, Ur Screen: NOT DETECTED

## 2015-12-01 LAB — ACETAMINOPHEN LEVEL

## 2015-12-01 LAB — SALICYLATE LEVEL: Salicylate Lvl: <4 mg/dL (ref 2.8–30.0)

## 2015-12-01 LAB — ETHANOL

## 2015-12-01 MED ORDER — ACETAMINOPHEN 325 MG PO TABS: 650.0000 mg | ORAL_TABLET | Freq: Four times a day (QID) | ORAL | Status: DC | PRN

## 2015-12-01 MED ORDER — LISINOPRIL 10 MG PO TABS
10.0000 mg | ORAL_TABLET | Freq: Every day | ORAL | Status: DC
  Administered 2015-12-01: 10 mg via ORAL
  Filled 2015-12-01: qty 1

## 2015-12-01 MED ORDER — NICOTINE 21 MG/24HR TD PT24
21.0000 mg | MEDICATED_PATCH | Freq: Once | TRANSDERMAL | Status: DC
  Administered 2015-12-01: 21 mg via TRANSDERMAL
  Filled 2015-12-01: qty 1

## 2015-12-01 MED ORDER — ALUM & MAG HYDROXIDE-SIMETH 200-200-20 MG/5ML PO SUSP: 30.0000 mL | ORAL | Status: DC | PRN

## 2015-12-01 MED ORDER — DIVALPROEX SODIUM 500 MG PO DR TAB
500.0000 mg | DELAYED_RELEASE_TABLET | Freq: Two times a day (BID) | ORAL | Status: DC
  Administered 2015-12-01 – 2015-12-05 (×8): 500 mg via ORAL
  Filled 2015-12-01 (×8): qty 1

## 2015-12-01 MED ORDER — QUETIAPINE FUMARATE 200 MG PO TABS
300.0000 mg | ORAL_TABLET | Freq: Every day | ORAL | Status: DC
  Administered 2015-12-01 – 2015-12-02 (×2): 300 mg via ORAL
  Filled 2015-12-01 (×2): qty 1

## 2015-12-01 MED ORDER — MAGNESIUM HYDROXIDE 400 MG/5ML PO SUSP: 30.0000 mL | Freq: Every day | ORAL | Status: DC | PRN

## 2015-12-01 MED ORDER — LITHIUM CARBONATE ER 450 MG PO TBCR: 450.0000 mg | EXTENDED_RELEASE_TABLET | Freq: Every day | ORAL | Status: DC

## 2015-12-01 MED ORDER — LISINOPRIL 10 MG PO TABS
10.0000 mg | ORAL_TABLET | Freq: Every day | ORAL | Status: DC
  Administered 2015-12-02: 10 mg via ORAL
  Filled 2015-12-01: qty 1

## 2015-12-01 MED ORDER — LITHIUM CARBONATE ER 450 MG PO TBCR
450.0000 mg | EXTENDED_RELEASE_TABLET | Freq: Every day | ORAL | Status: DC
Start: 2015-12-01 — End: 2015-12-05
  Administered 2015-12-01 – 2015-12-04 (×4): 450 mg via ORAL
  Filled 2015-12-01 (×4): qty 1

## 2015-12-01 MED ORDER — QUETIAPINE FUMARATE 25 MG PO TABS: 300.0000 mg | ORAL_TABLET | Freq: Every day | ORAL | Status: DC

## 2015-12-01 MED ORDER — DIVALPROEX SODIUM 500 MG PO DR TAB
500.0000 mg | DELAYED_RELEASE_TABLET | Freq: Two times a day (BID) | ORAL | Status: DC
  Administered 2015-12-01: 500 mg via ORAL
  Filled 2015-12-01: qty 1

## 2015-12-01 NOTE — ED Notes (Signed)
Patient in bathroom a this time

## 2015-12-01 NOTE — ED Notes (Signed)
IVC/Consult completed/pending placement 

## 2015-12-01 NOTE — ED Provider Notes (Signed)
[facility] Emergency Department Provider Note   ____________________________________________   First MD Initiated Contact with Patient 12/01/15 0104     (approximate)  I have reviewed the triage vital signs and the nursing notes.   HISTORY  Chief Complaint Medical Clearance    HPI Tony Fowler is a 70 y.o. male brought to the ED from home under IVC by police for threatening his daughters for not giving him cigarettes. Patient has a history of bipolar disorder and self-reported PTSD who arrives agitated and angry at his daughters. Denies active SI/HI/AH/VH. Voices no medical complaints. Denies recent fever, chills, chest pain, shortness of breath, abdominal pain, nausea, vomiting, diarrhea. Denies recent travel or trauma. Nothing makes his symptoms better or worse.   Past Medical History:  Diagnosis Date  . Bipolar 1 disorder [facility](HCC)     Patient Active Problem List   Diagnosis Date Noted  . Bipolar 1 disorder (HCC) 02/22/2015  . BPH (benign prostatic hyperplasia) 02/22/2015  . Benign hypertension 09/07/2014  . CKD (chronic kidney disease), stage III 09/07/2014    Past Surgical History:  Procedure Laterality Date  . TONSILECTOMY/ADENOIDECTOMY WITH MYRINGOTOMY      Prior to Admission medications   Medication Sig Start Date End Date Taking? Authorizing Provider  lisinopril (PRINIVIL,ZESTRIL) 10 MG tablet Take 1 tablet (10 mg total) by mouth daily. 02/22/15   Steele SizerMark A Crissman, MD  lithium carbonate 300 MG capsule Take 300 mg by mouth 2 (two) times daily with a meal.    Historical Provider, MD  Multiple Vitamin (MULTIVITAMIN) tablet Take 1 tablet by mouth daily.    Historical Provider, MD  QUEtiapine (SEROQUEL) 300 MG tablet Take 1 tablet (300 mg total) by mouth at bedtime. 11/13/15   Steele SizerMark A Crissman, MD    Allergies Penicillins  Family History  Problem Relation Age of Onset  . Heart disease Mother   . Hypertension Mother   . Mental illness  Mother   . Heart disease Brother   . Hypertension Brother     Social History Social History  Substance Use Topics  . Smoking status: Current Every Day Smoker    Packs/day: 0.75    Years: 0.00    Types: Cigarettes  . Smokeless tobacco: Never Used  . Alcohol use No    Review of Systems  Constitutional: No fever/chills. Eyes: No visual changes. ENT: No sore throat. Cardiovascular: Denies chest pain. Respiratory: Denies shortness of breath. Gastrointestinal: No abdominal pain.  No nausea, no vomiting.  No diarrhea.  No constipation. Genitourinary: Negative for dysuria. Musculoskeletal: Negative for back pain. Skin: Negative for rash. Neurological: Negative for headaches, focal weakness or numbness. Psychiatric:Positive for agitation.  10-point ROS otherwise negative.  ____________________________________________   PHYSICAL EXAM:  VITAL SIGNS: ED Triage Vitals  Enc Vitals Group     BP 12/01/15 0021 (!) 179/97     Pulse Rate 12/01/15 0021 (!) 108     Resp 12/01/15 0021 18     Temp 12/01/15 0021 98.3 F (36.8 C)     Temp Source 12/01/15 0021 Oral     SpO2 12/01/15 0021 97 %     Weight 12/01/15 0021 133 lb (60.3 kg)     Height 12/01/15 0021 5\' 7"  (1.702 m)     Head Circumference --      Peak Flow --      Pain Score 12/01/15 0022 1     Pain Loc --      Pain Edu? --  Excl. in GC? --     Constitutional: Alert and oriented. Well appearing and in no acute distress. Eyes: Conjunctivae are normal. PERRL. EOMI. Head: Atraumatic. Nose: No congestion/rhinnorhea. Mouth/Throat: Mucous membranes are moist.  Oropharynx non-erythematous. Neck: No stridor.   Cardiovascular: Normal rate, regular rhythm. Grossly normal heart sounds.  Good peripheral circulation. Respiratory: Normal respiratory effort.  No retractions. Lungs CTAB. Gastrointestinal: Soft and nontender. No distention. No abdominal bruits. No CVA tenderness. Musculoskeletal: No lower extremity tenderness nor  edema.  No joint effusions. Neurologic:  Normal speech and language. No gross focal neurologic deficits are appreciated. No gait instability. Skin:  Skin is warm, dry and intact. No rash noted. Psychiatric: Mood and affect are agitated. Speech and behavior are normal.  ____________________________________________   LABS (all labs ordered are listed, but only abnormal results are displayed)  Labs Reviewed  CBC - Abnormal; Notable for the following:       Result Value   WBC 12.0 (*)    All other components within normal limits  COMPREHENSIVE METABOLIC PANEL - Abnormal; Notable for the following:    Glucose, Bld 155 (*)    GFR calc non Af Amer 60 (*)    All other components within normal limits  URINALYSIS COMPLETEWITH MICROSCOPIC ([facility] ONLY) - Abnormal; Notable for the following:    Color, Urine YELLOW (*)    APPearance CLEAR (*)    Hgb urine dipstick 1+ (*)    Leukocytes, UA TRACE (*)    All other components within normal limits  ACETAMINOPHEN LEVEL - Abnormal; Notable for the following:    Acetaminophen (Tylenol), Serum <10 (*)    All other components within normal limits  ETHANOL  URINE DRUG SCREEN, QUALITATIVE ([facility] ONLY)  SALICYLATE LEVEL   ____________________________________________  EKG  None ____________________________________________  RADIOLOGY  None ____________________________________________   PROCEDURES  Procedure(s) performed: None  Procedures  Critical Care performed: No  ____________________________________________   INITIAL IMPRESSION / ASSESSMENT AND PLAN / ED COURSE  Pertinent labs & imaging results that were available during my care of the patient were reviewed by me and considered in my medical decision making (see chart for details).  70 year old male with a history of bipolar disorder out to the ED under IVC for threatening his daughters for not giving him cigarettes. He is agitated but able to be verbally redirected. Patient will  remain in the ED under IVC pending TTS and psychiatry consult. Nicotine patch ordered for patient.  Clinical Course  Comment By Time  No further events overnight. Patient is medically cleared and remains in the emergency department under IVC pending psychiatry evaluation today. Irean Hong, MD 08/25 0720     ____________________________________________   FINAL CLINICAL IMPRESSION(S) / ED DIAGNOSES  Final diagnoses:  Bipolar disorder, current episode mixed, moderate (HCC)      NEW MEDICATIONS STARTED DURING THIS VISIT:  New Prescriptions   No medications on file     Note:  This document was prepared using Dragon voice recognition software and may include unintentional dictation errors.    Irean Hong, MD 12/01/15 [phone]

## 2015-12-01 NOTE — Consult Note (Signed)
[facility] Face-to-Face Psychiatry Consult   Reason for Consult:  Consult for 70 year old man with a history of bipolar disorder who presents manic and agitated Referring Physician:  Scha[place]itz Patient Identification: Tony Fowler MRN:  [id number] Principal Diagnosis: Bipolar disorder, manic ([place]) Diagnosis:   Patient Active Problem List   Diagnosis Date Noted  . Bipolar disorder, manic ([place]) [F31.10] 12/01/2015  . Bipolar 1 disorder ([place]) [F31.9] 02/22/2015  . BPH (benign prostatic hyperplasia) [N40.0] 02/22/2015  . Benign hypertension [I10] 09/07/2014  . CKD (chronic kidney disease), stage III [N18.3] 09/07/2014    Total Time spent with patient: 1 hour  Subjective:   Tony Fowler is a 70 y.o. male patient admitted with "just give me a carton of cigarettes and I'll take an apology and move out".  HPI:  Patient interviewed. Chart reviewed. Labs and vitals reviewed. 70 year old man brought here under involuntary commitment. Patient's insight is poor and he is not a very good historian although he is not demented. Patient is agitated and manic and tends to repeat himself or go off topic very quickly. Family reports and it is documented in the chart as well that he's been getting increasingly manic 4 days or weeks since he has been off of his medicine. Gets agitated and aggressive at home. Reportedly finally got hostile and aggressive with his daughter over concerns about a cigarette. Patient is incapable of telling a coherent version of the recent situation. Goes on and on about how his daughter wouldn't give him a cigarette and so he is going to have her arrested and committed. He is able to tell me that he is no longer taking his lithium. It looks like a concern came up in the last couple months about his creatinine creeping up and so his lithium was discontinued. Ever since then it sounds like he's been getting more and more manic. Patient denies that he's having any hallucinations. Denies suicidal  or homicidal ideation. Says that he is still taking his Seroquel although he is unclear about the dosage of it. Denies that he's been drinking or using drugs.  Medical history: Patient has high blood pressure. He has renal insufficiency and his creatinine has been creeping up a little bit although it still only about 1.2. Any intent.  Substance abuse history: He says that he used to drink but hasn't had any alcohol in many years. Denies any history of other drug abuse.  Social history: Lives with his daughter and his daughter's family. It sounds excessive been a pretty stable situation until he started to get manic recently. Sounds like there is been other upper or in the home as well.  Past Psychiatric History: Patient has a long history of bipolar disorder probably 30 years or so. He was on lithium for a long time with good stability. Only recently stopped it because of concern about his kidney function. Now on Seroquel and Depakote. He denies any history of suicide attempts or violence. He has had positive hospitalizations in the past and he currently follows up at [facility].  Risk to Self: Suicidal Ideation: No Suicidal Intent: No Is patient at risk for suicide?: No Suicidal Plan?: No Access to Means: No What has been your use of drugs/alcohol within the last 12 months?: none reported by patient How many times?: 0 Other Self Harm Risks: none identified Triggers for Past Attempts: None known Intentional Self Injurious Behavior: None Risk to Others: Homicidal Ideation: No Thoughts of Harm to Others: No Current Homicidal Intent: No Current Homicidal Plan:  No Access to Homicidal Means: No Identified Victim: None identified History of harm to others?: No Assessment of Violence: None Noted Violent Behavior Description: None identified Does patient have access to weapons?: No Criminal Charges Pending?: No Does patient have a court date: No Prior Inpatient Therapy: Prior Inpatient Therapy:  No Prior Therapy Dates: na Prior Therapy Facilty/Provider(s): na Reason for Treatment: na Prior Outpatient Therapy: Prior Outpatient Therapy: No Prior Therapy Dates: na Prior Therapy Facilty/Provider(s): na Reason for Treatment: na Does patient have an ACCT team?: No Does patient have Intensive In-House Services?  : No Does patient have [facility] services? : No Does patient have P4CC services?: No  Past Medical History:  Past Medical History:  Diagnosis Date  . Bipolar 1 disorder [facility])     Past Surgical History:  Procedure Laterality Date  . TONSILECTOMY/ADENOIDECTOMY WITH MYRINGOTOMY     Family History:  Family History  Problem Relation Age of Onset  . Heart disease Mother   . Hypertension Mother   . Mental illness Mother   . Heart disease Brother   . Hypertension Brother    Family Psychiatric  History: Positive for bipolar disorder in his mother and in a granddaughter. Social History:  History  Alcohol Use No     History  Drug Use No    Social History   Social History  . Marital status: Divorced    Spouse name: N/A  . Number of children: N/A  . Years of education: N/A   Social History Main Topics  . Smoking status: Current Every Day Smoker    Packs/day: 0.75    Years: 0.00    Types: Cigarettes  . Smokeless tobacco: Never Used  . Alcohol use No  . Drug use: No  . Sexual activity: Not on file   Other Topics Concern  . Not on file   Social History Narrative  . No narrative on file   Additional Social History:    Allergies:   Allergies  Allergen Reactions  . Penicillins Rash    Patient would not answer follow up questions    Labs:  Results for orders placed or performed during the hospital encounter of 12/01/15 (from the past 48 hour(s))  CBC     Status: Abnormal   Collection Time: 12/01/15 12:24 AM  Result Value Ref Range   WBC 12.0 (H) 3.8 - 10.6 K/uL   RBC 4.58 4.40 - 5.90 MIL/uL   Hemoglobin 14.6 13.0 - 18.0 g/dL   HCT 42.2 40.0 - 52.0  %   MCV 92.1 80.0 - 100.0 fL   MCH 31.9 26.0 - 34.0 pg   MCHC 34.6 32.0 - 36.0 g/dL   RDW 13.4 11.5 - 14.5 %   Platelets 247 150 - 440 K/uL  Comprehensive metabolic panel     Status: Abnormal   Collection Time: 12/01/15 12:24 AM  Result Value Ref Range   Sodium 138 135 - 145 mmol/L   Potassium 4.8 3.5 - 5.1 mmol/L   Chloride 106 101 - 111 mmol/L   CO2 25 22 - 32 mmol/L   Glucose, Bld 155 (H) 65 - 99 mg/dL   BUN 18 6 - 20 mg/dL   Creatinine, Ser 1.21 0.61 - 1.24 mg/dL   Calcium 9.7 8.9 - 10.3 mg/dL   Total Protein 6.8 6.5 - 8.1 g/dL   Albumin 3.8 3.5 - 5.0 g/dL   AST 23 15 - 41 U/L   ALT 17 17 - 63 U/L   Alkaline Phosphatase 64 38 -  126 U/L   Total Bilirubin 0.3 0.3 - 1.2 mg/dL   GFR calc non Af Amer 60 (L) >60 mL/min   GFR calc Af Amer >60 >60 mL/min    Comment: (NOTE) The eGFR has been calculated using the CKD EPI equation. This calculation has not been validated in all clinical situations. eGFR's persistently <60 mL/min signify possible Chronic Kidney Disease.    Anion gap 7 5 - 15  Ethanol     Status: None   Collection Time: 12/01/15 12:24 AM  Result Value Ref Range   Alcohol, Ethyl (B) <5 <5 mg/dL    Comment:        LOWEST DETECTABLE LIMIT FOR SERUM ALCOHOL IS 5 mg/dL FOR MEDICAL PURPOSES ONLY   Urinalysis complete, with microscopic ([facility] only)     Status: Abnormal   Collection Time: 12/01/15 12:24 AM  Result Value Ref Range   Color, Urine YELLOW (A) YELLOW   APPearance CLEAR (A) CLEAR   Glucose, UA NEGATIVE NEGATIVE mg/dL   Bilirubin Urine NEGATIVE NEGATIVE   Ketones, ur NEGATIVE NEGATIVE mg/dL   Specific Gravity, Urine 1.008 1.005 - 1.030   Hgb urine dipstick 1+ (A) NEGATIVE   pH 7.0 5.0 - 8.0   Protein, ur NEGATIVE NEGATIVE mg/dL   Nitrite NEGATIVE NEGATIVE   Leukocytes, UA TRACE (A) NEGATIVE   RBC / HPF 0-5 0 - 5 RBC/hpf   WBC, UA 0-5 0 - 5 WBC/hpf   Bacteria, UA NONE SEEN NONE SEEN   Squamous Epithelial / LPF NONE SEEN NONE SEEN  Urine Drug Screen,  Qualitative ([facility] only)     Status: None   Collection Time: 12/01/15 12:24 AM  Result Value Ref Range   Tricyclic, Ur Screen NONE DETECTED NONE DETECTED   Amphetamines, Ur Screen NONE DETECTED NONE DETECTED   MDMA (Ecstasy)Ur Screen NONE DETECTED NONE DETECTED   Cocaine Metabolite,Ur [place] NONE DETECTED NONE DETECTED   Opiate, Ur Screen NONE DETECTED NONE DETECTED   Phencyclidine (PCP) Ur S NONE DETECTED NONE DETECTED   Cannabinoid 50 Ng, Ur [place] NONE DETECTED NONE DETECTED   Barbiturates, Ur Screen NONE DETECTED NONE DETECTED   Benzodiazepine, Ur Scrn NONE DETECTED NONE DETECTED   Methadone Scn, Ur NONE DETECTED NONE DETECTED    Comment: (NOTE) 562  Tricyclics, urine               Cutoff 1000 ng/mL 200  Amphetamines, urine             Cutoff 1000 ng/mL 300  MDMA (Ecstasy), urine           Cutoff 500 ng/mL 400  Cocaine Metabolite, urine       Cutoff 300 ng/mL 500  Opiate, urine                   Cutoff 300 ng/mL 600  Phencyclidine (PCP), urine      Cutoff 25 ng/mL 700  Cannabinoid, urine              Cutoff 50 ng/mL 800  Barbiturates, urine             Cutoff 200 ng/mL 900  Benzodiazepine, urine           Cutoff 200 ng/mL 1000 Methadone, urine                Cutoff 300 ng/mL 1100 1200 The urine drug screen provides only a preliminary, unconfirmed 1300 analytical test result and should not be used for non-medical 1400 purposes.  Clinical consideration and professional judgment should 1500 be applied to any positive drug screen result due to possible 1600 interfering substances. A more specific alternate chemical method 1700 must be used in order to obtain a confirmed analytical result.  1800 Gas chromato graphy / mass spectrometry (GC/MS) is the preferred 1900 confirmatory method.   Acetaminophen level     Status: Abnormal   Collection Time: 12/01/15 12:24 AM  Result Value Ref Range   Acetaminophen (Tylenol), Serum <10 (L) 10 - 30 ug/mL    Comment:        THERAPEUTIC CONCENTRATIONS  VARY SIGNIFICANTLY. A RANGE OF 10-30 ug/mL MAY BE AN EFFECTIVE CONCENTRATION FOR MANY PATIENTS. HOWEVER, SOME ARE BEST TREATED AT CONCENTRATIONS OUTSIDE THIS RANGE. ACETAMINOPHEN CONCENTRATIONS >150 ug/mL AT 4 HOURS AFTER INGESTION AND >50 ug/mL AT 12 HOURS AFTER INGESTION ARE OFTEN ASSOCIATED WITH TOXIC REACTIONS.   Salicylate level     Status: None   Collection Time: 12/01/15 12:24 AM  Result Value Ref Range   Salicylate Lvl <4.9 2.8 - 30.0 mg/dL    Current Facility-Administered Medications  Medication Dose Route Frequency Provider Last Rate Last Dose  . divalproex (DEPAKOTE) DR tablet 500 mg  500 mg Oral Q12H Gonzella Lex, MD      . lisinopril (PRINIVIL,ZESTRIL) tablet 10 mg  10 mg Oral Daily Gonzella Lex, MD      . lithium carbonate (ESKALITH) CR tablet 450 mg  450 mg Oral QHS Gonzella Lex, MD      . nicotine (NICODERM CQ - dosed in mg/24 hours) patch 21 mg  21 mg Transdermal Once Paulette Blanch, MD   21 mg at 12/01/15 0138  . QUEtiapine (SEROQUEL) tablet 300 mg  300 mg Oral QHS Gonzella Lex, MD       Current Outpatient Prescriptions  Medication Sig Dispense Refill  . divalproex (DEPAKOTE ER) 500 MG 24 hr tablet Take 500 mg by mouth 2 (two) times daily.    Marland Kitchen lisinopril (PRINIVIL,ZESTRIL) 10 MG tablet Take 1 tablet (10 mg total) by mouth daily. 90 tablet 4  . Multiple Vitamin (MULTIVITAMIN) tablet Take 1 tablet by mouth daily.    . QUEtiapine (SEROQUEL) 300 MG tablet Take 1 tablet (300 mg total) by mouth at bedtime. 30 tablet 2  . lithium carbonate 300 MG capsule Take 300 mg by mouth 2 (two) times daily with a meal.      Musculoskeletal: Strength & Muscle Tone: within normal limits Gait & Station: normal Patient leans: N/A  Psychiatric Specialty Exam: Physical Exam  Nursing note and vitals reviewed. Constitutional: He appears well-developed and well-nourished.  HENT:  Head: Normocephalic and atraumatic.  Eyes: Conjunctivae are normal. Pupils are equal, round,  and reactive to light.  Neck: Normal range of motion.  Cardiovascular: Regular rhythm and normal heart sounds.   Respiratory: Effort normal. No respiratory distress.  GI: Soft.  Musculoskeletal: Normal range of motion.  Neurological: He is alert.  Skin: Skin is warm and dry.  Psychiatric: His affect is labile and inappropriate. His speech is rapid and/or pressured. He is agitated. Thought content is paranoid. Cognition and memory are impaired. He expresses impulsivity. He is inattentive.    Review of Systems  Constitutional: Negative.   HENT: Negative.   Eyes: Negative.   Respiratory: Negative.   Cardiovascular: Negative.   Gastrointestinal: Negative.   Musculoskeletal: Negative.   Skin: Negative.   Neurological: Negative.   Psychiatric/Behavioral: Negative for depression, hallucinations, memory loss, substance abuse and suicidal ideas.  The patient is nervous/anxious. The patient does not have insomnia.     Blood pressure (!) 152/73, pulse 80, temperature 97.6 F (36.4 C), temperature source Oral, resp. rate 18, height _0  (1.702 m), weight 60.3 kg (133 lb), SpO2 99 %.Body mass index is 20.83 kg/m.  General Appearance: Casual  Eye Contact:  Good  Speech:  Pressured  Volume:  Increased  Mood:  Euthymic and Irritable  Affect:  Labile  Thought Process:  Disorganized  Orientation:  Full (Time, Place, and Person)  Thought Content:  Illogical, Paranoid Ideation and Tangential  Suicidal Thoughts:  No  Homicidal Thoughts:  No  Memory:  Immediate;   Fair Recent;   Poor Remote;   Fair  Judgement:  Impaired  Insight:  Shallow  Psychomotor Activity:  Increased and Restlessness  Concentration:  Concentration: Poor  Recall:  [employer] of Knowledge:  Fair  Language:  Good  Akathisia:  No  Handed:  Right  AIMS (if indicated):     Assets:  Communication Skills Desire for Improvement Financial Resources/Insurance Housing Physical Health Resilience Social Support  ADL's:   Impaired  Cognition:  WNL  Sleep:        Treatment Plan Summary: Daily contact with patient to assess and evaluate symptoms and progress in treatment, Medication management and Plan Patient is manic. Disorganized very psychotic and unable to make reasonable decisions and clearly agitated and dangerous at home. He will be admitted to the psychiatric service. Up old commitment. I am going to continue the Seroquel 300 mg at night as well as the Depakote 500 mg twice a day and I will restart lithium at a more modest dose of 450 mg at night only. Full complement of labs will be checked. EKG will be checked. 15 minute checks in place. Treatment team can work with patient on further stabilization.  Disposition: Recommend psychiatric Inpatient admission when medically cleared. Supportive therapy provided about ongoing stressors.  Alethia Berthold, MD 12/01/2015 3:24 PM

## 2015-12-01 NOTE — ED Notes (Signed)
Patient in restroom.

## 2015-12-01 NOTE — ED Triage Notes (Signed)
Pt here for agitation states "my daughter won't give me cigarettes".

## 2015-12-01 NOTE — BH Assessment (Signed)
Assessment Note  Tony CassisHerbert Fowler is an 70 y.o. male presenting to the ED under IVC, initiated by his daughters. Pt has a history of bipolar disorder and PTSD from serving in the [employer].   According to the IVC paperwork, patient was threatening his daguhters because they would not give him cigarettes.  Pt states that he decided to change his will and his daughters became angry towards him and would not give him his cigarettes out of spite.  Pt denies SI/HI and any auditory/visual hallucinations.  Diagnosis: Bipolar Disorder  Past Medical History:  Past Medical History:  Diagnosis Date  . Bipolar 1 disorder [facility](HCC)     Past Surgical History:  Procedure Laterality Date  . TONSILECTOMY/ADENOIDECTOMY WITH MYRINGOTOMY      Family History:  Family History  Problem Relation Age of Onset  . Heart disease Mother   . Hypertension Mother   . Mental illness Mother   . Heart disease Brother   . Hypertension Brother     Social History:  reports that he has been smoking Cigarettes.  He has been smoking about 0.75 packs per day for the past 0.00 years. He has never used smokeless tobacco. He reports that he does not drink alcohol or use drugs.  Additional Social History:  Alcohol / Drug Use History of alcohol / drug use?: No history of alcohol / drug abuse  CIWA: CIWA-Ar BP: (!) 179/97 Pulse Rate: (!) 108 COWS:    Allergies:  Allergies  Allergen Reactions  . Penicillins Rash    Patient would not answer follow up questions    Home Medications:  (Not in a hospital admission)  OB/GYN Status:  No LMP for male patient.  General Assessment Data [facility] Assessment: In system Is this a Tele or Face-to-Face Assessment?: Face-to-Face Is this an Initial Assessment or a Re-assessment for this encounter?: Initial Assessment Marital status: Single Maiden name: na Is patient pregnant?: No Pregnancy Status: No Living Arrangements: Alone Can pt return to current  living arrangement?: Yes Admission Status: Involuntary Is patient capable of signing voluntary admission?: No Referral Source: Self/Family/Friend Insurance type: Medicare  Medical Screening Exam [facility] Walk-in ONLY) Medical Exam completed: Yes  Crisis Care Plan Living Arrangements: Alone Legal Guardian: Other: (self) Name of Psychiatrist: na Name of Therapist: na  Education Status Is patient currently in school?: No Current Grade: na Highest grade of school patient has completed: college Name of school: na Contact person: na  Risk to self with the past 6 months Suicidal Ideation: No Has patient been a risk to self within the past 6 months prior to admission? : No Suicidal Intent: No Has patient had any suicidal intent within the past 6 months prior to admission? : No Is patient at risk for suicide?: No Suicidal Plan?: No Has patient had any suicidal plan within the past 6 months prior to admission? : No Access to Means: No What has been your use of drugs/alcohol within the last 12 months?: none reported by patient Previous Attempts/Gestures: No How many times?: 0 Other Self Harm Risks: none identified Triggers for Past Attempts: None known Intentional Self Injurious Behavior: None Family Suicide History: No Recent stressful life event(s): Other (Comment) Persecutory voices/beliefs?: Yes Depression: Yes Depression Symptoms: Loss of interest in usual pleasures Substance abuse history and/or treatment for substance abuse?: No Suicide prevention information given to non-admitted patients: Not applicable  Risk to Others within the past 6 months Homicidal Ideation: No Does patient have any lifetime risk  of violence toward others beyond the six months prior to admission? : No Thoughts of Harm to Others: No Current Homicidal Intent: No Current Homicidal Plan: No Access to Homicidal Means: No Identified Victim: None identified History of harm to others?: No Assessment of  Violence: None Noted Violent Behavior Description: None identified Does patient have access to weapons?: No Criminal Charges Pending?: No Does patient have a court date: No Is patient on probation?: No  Psychosis Hallucinations: None noted Delusions: Persecutory  Mental Status Report Appearance/Hygiene: In scrubs Eye Contact: Good Motor Activity: Freedom of movement, Hyperactivity, Restlessness Speech: Logical/coherent Level of Consciousness: Alert Mood: Pleasant Affect: Appropriate to circumstance Anxiety Level: Minimal Thought Processes: Relevant Judgement: Partial Orientation: Person, Place, Time, Situation Obsessive Compulsive Thoughts/Behaviors: None  Cognitive Functioning Concentration: Good Memory: Recent Intact, Remote Intact IQ: Average Insight: Fair Impulse Control: Fair Appetite: Fair Weight Loss: 0 Weight Gain: 0 Sleep: No Change Vegetative Symptoms: None  ADLScreening [facility] Assessment Services) Patient's cognitive ability adequate to safely complete daily activities?: Yes Patient able to express need for assistance with ADLs?: Yes Independently performs ADLs?: Yes (appropriate for developmental age)  Prior Inpatient Therapy Prior Inpatient Therapy: No Prior Therapy Dates: na Prior Therapy Facilty/Provider(s): na Reason for Treatment: na  Prior Outpatient Therapy Prior Outpatient Therapy: No Prior Therapy Dates: na Prior Therapy Facilty/Provider(s): na Reason for Treatment: na Does patient have an ACCT team?: No Does patient have Intensive In-House Services?  : No Does patient have [facility] services? : No Does patient have P4CC services?: No  ADL Screening (condition at time of admission) Patient's cognitive ability adequate to safely complete daily activities?: Yes Patient able to express need for assistance with ADLs?: Yes Independently performs ADLs?: Yes (appropriate for developmental age)       Abuse/Neglect Assessment (Assessment to be  complete while patient is alone) Physical Abuse: Denies Verbal Abuse: Denies Sexual Abuse: Denies Exploitation of patient/patient's resources: Denies Self-Neglect: Denies Values / Beliefs Cultural Requests During Hospitalization: None Spiritual Requests During Hospitalization: None Consults Spiritual Care Consult Needed: No Social Work Consult Needed: No      Additional Information 1:1 In Past 12 Months?: No CIRT Risk: No Elopement Risk: No Does patient have medical clearance?: Yes     Disposition:  Disposition Initial Assessment Completed for this Encounter: Yes Disposition of Patient: Other dispositions Other disposition(s): Other (Comment) (Pending Psych MD consult)  On Site Evaluation by:   Reviewed with Physician:    [place] 12/01/2015 3:27 AM

## 2015-12-01 NOTE — ED Notes (Signed)
Daughters given information regarding visiting hours and phone number given to nurse [phone] Sonda PrimesRachelle Bryant and [phone] Hannah BeatAndrea Mclain)

## 2015-12-01 NOTE — ED Notes (Signed)
Breakfast was given to patient. 

## 2015-12-01 NOTE — ED Notes (Signed)
Pt's daughter, Lorayne MarekRochelle, called to for admission update message HIPAA compliant

## 2015-12-01 NOTE — ED Notes (Signed)
Call for room assignment, Baxter HireKristen, RN: charge off unit, left msg and number

## 2015-12-02 DIAGNOSIS — F312 Bipolar disorder, current episode manic severe with psychotic features: Principal | ICD-10-CM

## 2015-12-02 LAB — HEMOGLOBIN A1C: Hgb A1c MFr Bld: 6 % (ref 4.0–6.0)

## 2015-12-02 MED ORDER — NICOTINE 21 MG/24HR TD PT24
21.0000 mg | MEDICATED_PATCH | Freq: Every day | TRANSDERMAL | Status: DC
  Administered 2015-12-02 – 2015-12-14 (×13): 21 mg via TRANSDERMAL
  Filled 2015-12-02 (×13): qty 1

## 2015-12-02 MED ORDER — LORAZEPAM 2 MG PO TABS
2.0000 mg | ORAL_TABLET | ORAL | Status: DC | PRN
  Administered 2015-12-09 – 2015-12-10 (×3): 2 mg via ORAL
  Filled 2015-12-02 (×3): qty 1

## 2015-12-02 NOTE — Progress Notes (Signed)
EKG completed

## 2015-12-02 NOTE — Plan of Care (Signed)
Problem: Education: Goal: Emotional status will improve Outcome: Progressing Patient not actively participating in plan of care. Poor insight, jokes around instead of discussing plan of care.

## 2015-12-02 NOTE — Tx Team (Addendum)
Initial Treatment Plan 12/02/2015 1:19 AM Karn CassisHerbert Costlow BJY:[id number]    PATIENT STRESSORS: Marital or family conflict Medication change or noncompliance   PATIENT STRENGTHS: Communication skills General fund of knowledge Physical Health   PATIENT IDENTIFIED PROBLEMS:   Medication change/noncompliance   Ineffective individual coping - Mania      "Talking to people"  "In murder ward, went out for coffee"         DISCHARGE CRITERIA:  Improved stabilization in mood, thinking, and/or behavior  PRELIMINARY DISCHARGE PLAN: Return to previous living arrangement  PATIENT/FAMILY INVOLVEMENT: This treatment plan has been presented to and reviewed with the patient, [name], and/or family member, .  The patient and family have been given the opportunity to ask questions and make suggestions.  Ernesto RutherfordKristen Aleta Manternach, RN 12/02/2015, 1:19 AM

## 2015-12-02 NOTE — Tx Team (Signed)
Pt admitted to unit without issue. Skin assessment completed, no contraband nor abnormalities noted. Pt difficult to assess due to delusional thinking.  Stated that he was " In a murder ward but hadn't done anything. I just went out for coffee".Stated that he had "papers at the government facility". Pt also was laughing inappropriately. Sexually inappropriate comments made to several staff members. Asked to close med room door when he was in medication room with Clinical research associatewriter. Proceeded to mention things such as rape and "killing your kids" Pt was referring to " They". "They have ways (guns) to rape you. They could kill your kids" "Do you want two bananas?  Pt also had to be redirected for talking about staff "titties".  Some aggression noted in dayroom when he jerked playing cards out of another Pts hands. Patient was med compliant with evening meds. Speech rapid. Poor hygiene. Blames daughter for admission and did not wish to sign a consent to release information.  Pt oriented to unit, given snack. Denied any pain. Voices no additional concerns at this time. Safety maintained.

## 2015-12-02 NOTE — H&P (Signed)
Psychiatric Admission Assessment Adult  Patient Identification: Tony Fowler MRN:  [id number] Date of Evaluation:  12/02/2015 Chief Complaint:  bi polar Principal Diagnosis: Bipolar affective disorder, manic, severe, with psychotic behavior (HCC) Diagnosis:   Patient Active Problem List   Diagnosis Date Noted  . Bipolar affective disorder, manic, severe, with psychotic behavior (HCC) [F31.2] 12/01/2015  . Involuntary commitment [Z04.6] 12/01/2015  . Tobacco use disorder [F17.200] 12/01/2015  . BPH (benign prostatic hyperplasia) [N40.0] 02/22/2015  . Benign hypertension [I10] 09/07/2014  . CKD (chronic kidney disease), stage III [N18.3] 09/07/2014   History of Present Illness:   70 y/o wm with History of bipolar disorder who presented under IVC to our emergency department on August 25 due to mania and aggression.  According to the IVC paperwork, patient was threatening his daguhters because they would not give him cigarettes.  Pt states that he decided to change his will and his daughters became angry towards him and would not give him his cigarettes out of spite.  Per nurses last night: Pt admitted to unit without issue. Skin assessment completed, no contraband nor abnormalities noted. Pt difficult to assess due to delusional thinking.  Stated that he was " In a murder ward but hadn't done anything. I just went out for coffee".Stated that he had "papers at the government facility". Pt also was laughing inappropriately. Sexually inappropriate comments made to several staff members. Asked to close med room door when he was in medication room with Clinical research associate. Proceeded to mention things such as rape and "killing your kids" Pt was referring to " They". "They have ways (guns) to rape you. They could kill your kids" "Do you want two bananas?  Pt also had to be redirected for talking about staff "titties".  Some aggression noted in dayroom when he jerked playing cards out of another Pts hands. Patient  was med compliant with evening meds. Speech rapid. Poor hygiene. Blames daughter for admission and did not wish to sign a consent to release information.    Today during admission the patient denies having any issues with sleep, appetite, energy is sleep or concentration. He denies having suicidality, homicidality or auditory or visual hallucinations. He says he is slept 8 hours last night. He tells me he is the most dangerous men in the world. He is concerned that his medications might affect all of his organs but is okay compliant with the current regimen.    Patient was labile, hyperverbal and somewhat argumentative.  Per nurses he was agitated also last night and they are requesting for him to have as needed medication for times of agitation or aggression.  Substance abuse history denies substance abuse  Trauma: Use will need to be investigated further as this was not addressed during this assessment. He had reported in the past having PTSD from his time in the [employer].  Prior to admission the patient reported taking Seroquel 100 mg and lithium however he stopped the lithium on May 8 in because his creatinine was abnormally elevated.  Associated Signs/Symptoms: Depression Symptoms:  denies (Hypo) Manic Symptoms:  Distractibility, Flight of Ideas, Grandiosity, Impulsivity, Irritable Mood, Labiality of Mood, Anxiety Symptoms:  denies Psychotic Symptoms:  denies PTSD Symptoms: NA Total Time spent with patient: 1 hour  Past Psychiatric History: Patient says he has been seeing Dr. Lourdes Sledge. Says that he has an appointment in October. He has been hospitalized at least 5 times in the past for what appears to be mania. He denies any history suicidal attempts or  self-injurious behaviors.  Is the patient at risk to self? Yes.    Has the patient been a risk to self in the past 6 months? No.  Has the patient been a risk to self within the distant past? No.  Is the patient a risk to others? Yes.     Has the patient been a risk to others in the past 6 months? No.  Has the patient been a risk to others within the distant past? Yes.     Prior Inpatient Therapy:   Prior Outpatient Therapy:    Alcohol Screening: 1. How often do you have a drink containing alcohol?: Never 2. How many drinks containing alcohol do you have on a typical day when you are drinking?: 1 or 2 3. How often do you have six or more drinks on one occasion?: Never Preliminary Score: 0 4. How often during the last year have you found that you were not able to stop drinking once you had started?: Never 5. How often during the last year have you failed to do what was normally expected from you becasue of drinking?: Never 6. How often during the last year have you needed a first drink in the morning to get yourself going after a heavy drinking session?: Never 7. How often during the last year have you had a feeling of guilt of remorse after drinking?: Never 8. How often during the last year have you been unable to remember what happened the night before because you had been drinking?: Never 9. Have you or someone else been injured as a result of your drinking?: No 10. Has a relative or friend or a doctor or another health worker been concerned about your drinking or suggested you cut down?: No Alcohol Use Disorder Identification Test Final Score (AUDIT): 0 Brief Intervention: AUDIT score less than 7 or less-screening does not suggest unhealthy drinking-brief intervention not indicated  Past Medical History: Hypertension taking lisinopril 10 mg  Past Medical History:  Diagnosis Date  . Bipolar 1 disorder [facility](HCC)     Past Surgical History:  Procedure Laterality Date  . TONSILECTOMY/ADENOIDECTOMY WITH MYRINGOTOMY     Family History:  Family History  Problem Relation Age of Onset  . Heart disease Mother   . Hypertension Mother   . Mental illness Mother   . Heart disease Brother   . Hypertension Brother    Family  Psychiatric  History: His mother was diagnosed with schizophrenia however he think she was misdiagnosed and she was actually bipolar. He denies any history of suicides in his family   Tobacco Screening: Have you used any form of tobacco in the last 30 days? (Cigarettes, Smokeless Tobacco, Cigars, and/or Pipes): Yes Tobacco use, Select all that apply: 5 or more cigarettes per day Are you interested in Tobacco Cessation Medications?: No, patient refused Counseled patient on smoking cessation including recognizing danger situations, developing coping skills and basic information about quitting provided: Refused/Declined practical counseling   Social History: [place] lives with [place] in [place]. He has 2 adult daughters. He is divorced from his wife. He was in the [employer], in the army for 9 years. He has been receiving Social Security disability for many years. He denies having any legal history.  History  Alcohol Use No     History  Drug Use No     Allergies:   Allergies  Allergen Reactions  . Penicillins Rash    Patient would not answer follow up questions   Lab Results:  Results for orders placed or performed during the hospital encounter of 12/01/15 (from the past 48 hour(s))  Lipid panel, fasting     Status: Abnormal   Collection Time: 12/01/15 12:24 AM  Result Value Ref Range   Cholesterol 191 0 - 200 mg/dL   Triglycerides 409 (H) <150 mg/dL   HDL 46 >81 mg/dL   Total CHOL/HDL Ratio 4.2 RATIO   VLDL 38 0 - 40 mg/dL   LDL Cholesterol 191 (H) 0 - 99 mg/dL    Comment:        Total Cholesterol/HDL:CHD Risk Coronary Heart Disease Risk Table                     Men   Women  1/2 Average Risk   3.4   3.3  Average Risk       5.0   4.4  2 X Average Risk   9.6   7.1  3 X Average Risk  23.4   11.0        Use the calculated Patient Ratio above and the CHD Risk Table to determine the patient's CHD Risk.        ATP III CLASSIFICATION (LDL):  <100     mg/dL   Optimal   478-295  mg/dL   Near or Above                    Optimal  130-159  mg/dL   Borderline  621-308  mg/dL   High  >657     mg/dL   Very High   TSH     Status: Abnormal   Collection Time: 12/01/15 12:24 AM  Result Value Ref Range   TSH 0.200 (L) 0.350 - 4.500 uIU/mL  Valproic acid level     Status: None   Collection Time: 12/01/15 12:24 AM  Result Value Ref Range   Valproic Acid Lvl 65 50.0 - 100.0 ug/mL    Blood Alcohol level:  Lab Results  Component Value Date   ETH <5 12/01/2015    Metabolic Disorder Labs:  No results found for: HGBA1C, MPG No results found for: PROLACTIN Lab Results  Component Value Date   CHOL 191 12/01/2015   TRIG 189 (H) 12/01/2015   HDL 46 12/01/2015   CHOLHDL 4.2 12/01/2015   VLDL 38 12/01/2015   LDLCALC 107 (H) 12/01/2015   LDLCALC 124 (H) 02/22/2015    Current Medications: Current Facility-Administered Medications  Medication Dose Route Frequency Provider Last Rate Last Dose  . acetaminophen (TYLENOL) tablet 650 mg  650 mg Oral Q6H PRN Audery Amel, MD      . alum & mag hydroxide-simeth (MAALOX/MYLANTA) 200-200-20 MG/5ML suspension 30 mL  30 mL Oral Q4H PRN Audery Amel, MD      . divalproex (DEPAKOTE) DR tablet 500 mg  500 mg Oral Q12H Audery Amel, MD   500 mg at 12/02/15 0855  . lithium carbonate (ESKALITH) CR tablet 450 mg  450 mg Oral QHS Audery Amel, MD   450 mg at 12/01/15 2118  . LORazepam (ATIVAN) tablet 2 mg  2 mg Oral Q4H PRN Jimmy Footman, MD      . magnesium hydroxide (MILK OF MAGNESIA) suspension 30 mL  30 mL Oral Daily PRN Audery Amel, MD      . nicotine (NICODERM CQ - dosed in mg/24 hours) patch 21 mg  21 mg Transdermal Daily Jimmy Footman, MD      . QUEtiapine (  SEROQUEL) tablet 300 mg  300 mg Oral QHS Audery Amel, MD   300 mg at 12/01/15 2118   PTA Medications: Prescriptions Prior to Admission  Medication Sig Dispense Refill Last Dose  . divalproex (DEPAKOTE ER) 500 MG 24 hr tablet Take  500 mg by mouth 2 (two) times daily.   unknown at unknown  . lisinopril (PRINIVIL,ZESTRIL) 10 MG tablet Take 1 tablet (10 mg total) by mouth daily. 90 tablet 4 unknown at unknown  . lithium carbonate 300 MG capsule Take 300 mg by mouth 2 (two) times daily with a meal.   Not Taking at Unknown time  . Multiple Vitamin (MULTIVITAMIN) tablet Take 1 tablet by mouth daily.   unknown at unknown  . QUEtiapine (SEROQUEL) 300 MG tablet Take 1 tablet (300 mg total) by mouth at bedtime. 30 tablet 2 unknown at unknown    Musculoskeletal: Strength & Muscle Tone: within normal limits Gait & Station: normal Patient leans: N/A  Psychiatric Specialty Exam: Physical Exam  Constitutional: He is oriented to person, place, and time. He appears well-developed and well-nourished.  HENT:  Head: Normocephalic and atraumatic.  Neck: Normal range of motion.  Respiratory: Effort normal.  Musculoskeletal: Normal range of motion.  Neurological: He is alert and oriented to person, place, and time.    Review of Systems  Constitutional: Negative.   HENT: Negative.   Eyes: Negative.   Respiratory: Negative.   Cardiovascular: Negative.   Gastrointestinal: Negative.   Genitourinary: Negative.   Musculoskeletal: Negative.   Skin: Negative.   Neurological: Negative.   Endo/Heme/Allergies: Negative.   Psychiatric/Behavioral: Negative.     Blood pressure 122/81, pulse 90, temperature 98 F (36.7 C), temperature source Oral, resp. rate 18, height 5\' 6"  (1.676 m), weight 59.4 kg (131 lb), SpO2 100 %.Body mass index is 21.14 kg/m.  General Appearance: Guarded  Eye Contact:  Good  Speech:  Pressured  Volume:  Increased  Mood:  Irritable  Affect:  Congruent  Thought Process:  Linear and Descriptions of Associations: Tangential  Orientation:  Full (Time, Place, and Person)  Thought Content:  Hallucinations: None  Suicidal Thoughts:  No  Homicidal Thoughts:  No  Memory:  Immediate;   Fair Recent;   Good Remote;    Good  Judgement:  Poor  Insight:  Shallow  Psychomotor Activity:  Increased  Concentration:  Concentration: Poor and Attention Span: Poor  Recall:  Poor  Fund of Knowledge:  Good  Language:  Good  Akathisia:  No  Handed:    AIMS (if indicated):     Assets:  Manufacturing systems engineer Social Support  ADL's:  Intact  Cognition:  WNL  Sleep:  Number of Hours: 7       Treatment Plan Summary: Daily contact with patient to assess and evaluate symptoms and progress in treatment and Medication management  Bipolar disorder current episode manic tinea on Seroquel 300 mg daily at bedtime, lithium 450 mg daily at bedtime and Depakote 500 mg twice a day.  For agitation and insomnia I will order Ativan 2 mg every 4 hours when necessary   Hypertension the patient is currently treated with lisinopril 10 mg however this medication can cause or intrusive risk for lithium toxicity therefore it will be discontinued.  If blood pressure elevated will add a different antihypertensive   Low TSH: We'll recommend to recheck TSH and thyroid function next week  Tobacco use disorder will order nicotine patch 21 mg a day  Diet low sodium  Precautions every  15 minute checks  Hospitalization and status continue involuntary commitment  Follow-up with Dr. Lourdes Sledge  Disposition back to his daughter's house once stable  Labs we'll measure patient has hemoglobin A1c, lipid panel. We will check lithium levels next week along with Depakote levels.    I certify that inpatient services furnished can reasonably be expected to improve the patient's condition.    Jimmy Footman, MD 8/26/201710:36 AM

## 2015-12-02 NOTE — [facility] Suicide Risk Assessment (Signed)
[facility]   Nursing information obtained from:    Demographic factors:    Current Mental Status:    Loss Factors:    Historical Factors:    Risk Reduction Factors:     Total Time spent with patient: 1 hour Principal Problem: Bipolar affective disorder, manic, severe, with psychotic behavior (HCC) Diagnosis:   Patient Active Problem List   Diagnosis Date Noted  . Bipolar affective disorder, manic, severe, with psychotic behavior (HCC) [F31.2] 12/01/2015  . Involuntary commitment [Z04.6] 12/01/2015  . Tobacco use disorder [F17.200] 12/01/2015  . BPH (benign prostatic hyperplasia) [N40.0] 02/22/2015  . Benign hypertension [I10] 09/07/2014  . CKD (chronic kidney disease), stage III [N18.3] 09/07/2014   Subjective Data:   Continued Clinical Symptoms:  Alcohol Use Disorder Identification Test Final Score (AUDIT): 0 The "Alcohol Use Disorders Identification Test", Guidelines for Use in Primary Care, Second Edition.  World [employer] [facility](WHO). Score between 0-7:  no or low risk or alcohol related problems. Score between 8-15:  moderate risk of alcohol related problems. Score between 16-19:  high risk of alcohol related problems. Score 20 or above:  warrants further diagnostic evaluation for alcohol dependence and treatment.   CLINICAL FACTORS:   Severe Anxiety and/or Agitation Currently Psychotic Previous Psychiatric Diagnoses and Treatments    Psychiatric Specialty Exam: Physical Exam  ROS  Blood pressure 122/81, pulse 90, temperature 98 F (36.7 C), temperature source Oral, resp. rate 18, height 5\' 6"  (1.676 m), weight 59.4 kg (131 lb), SpO2 100 %.Body mass index is 21.14 kg/m.                                                    Sleep:  Number of Hours: 7      COGNITIVE FEATURES THAT CONTRIBUTE TO RISK:  Loss of executive function    SUICIDE RISK:   Moderate:  Frequent suicidal ideation with limited intensity,  and duration, some specificity in terms of plans, no associated intent, good self-control, limited dysphoria/symptomatology, some risk factors present, and identifiable protective factors, including available and accessible social support.   [facility] C[facility]  I certify that inpatient services furnished can reasonably be expected to improve the patient's condition.  Jimmy FootmanHernandez-Gonzalez,  Vencent Hauschild, MD 12/02/2015, 10:10 AM

## 2015-12-02 NOTE — Progress Notes (Addendum)
Patient with bright affect, cooperative behavior with meals, meds and plan of care. No SI/HI/AVH at this time. Patient is delusional stating he came to hospital because he asked his daughter for 2 cigarettes. Verbally inappropriate during attempted assessment with Clinical research associatewriter. Attempts made at inappropriate humor and deflecting assessment questions.  Requests Nicotene patch, ordered requested and patch applied. Safety maintained. States his daily goal is to "get out of here". Slightly confused as to situation. Patient recent admit, blames his daughter for his admission, poor insight.

## 2015-12-02 NOTE — [facility] Group Notes (Signed)
[facility] LCSW Group Therapy  12/02/2015 2:56 PM  Type of Therapy:  Group Therapy  Participation Level:  Pt did not attend group. CSW invited pt to group.   Summary of Progress/Problems:Communications: Patients identify how individuals communicate with one another appropriately and inappropriately. Patients will be guided to discuss their thoughts, feelings, and behaviors related to barriers when communicating. The group will process together ways to execute positive and appropriate communications.   Tony Fowler G. Garnette CzechSampson MSW, LCSWA 12/02/2015, 2:56 PM

## 2015-12-02 NOTE — [facility] Group Notes (Signed)
[facility] Group Notes:  (Nursing/MHT/Case Management/Adjunct)  Date:  12/02/2015  Time:  9:45 PM  Type of Therapy:  Group Therapy  Participation Level:  Did Not Attend  Participation Quality:

## 2015-12-02 NOTE — [facility] Group Notes (Signed)
[facility] Group Notes:  (Nursing/MHT/Case Management/Adjunct)  Date:  12/02/2015  Time:  5:17 PM  Type of Therapy:  Psychoeducational Skills  Participation Level:  Did Not Attend   Ayaka Andes J Rajanee Schuelke 12/02/2015, 5:17 PM 

## 2015-12-03 MED ORDER — QUETIAPINE FUMARATE 200 MG PO TABS
200.0000 mg | ORAL_TABLET | Freq: Every day | ORAL | Status: DC
  Administered 2015-12-03 – 2015-12-06 (×4): 200 mg via ORAL
  Filled 2015-12-03 (×4): qty 1

## 2015-12-03 NOTE — [facility] Counselor (Signed)
CSW met with patient to complete PSA. CSW explained the purpose of the CSW assessment. Patient is a poor historian and would repeatedly call the CSW "stupid" and became irritable when CSW attempted to discuss discharge planning. Patient stated "I don't need to talk about discharge because I don't plan on discharging anytime soon. I can save money while I am here". CSW attempted to redirect patient however patient stated to CSW "How stupid are you, I just told you that I not going any damn where!" Patient became hostile and stated "Yall can't discharge me because I'm not going back to live with my daughter! If yall discharge me I'm just going to come right back here. How stupid are you?" CSW ended the assessment due to patient being inappropriate. Patient is declining CSW to contact family members. CSW completed PSA from the information that could be assessed from PSA with patient and chart review.  Ermon Sagan G. [place], [facility] 12/03/2015 11:39 AM

## 2015-12-03 NOTE — Plan of Care (Signed)
Problem: Education: Goal: Will be free of psychotic symptoms Outcome: Progressing Pt appears to be improving , per pt own admission, but pt has loose associations at times and appears to be responding, but is redirectable at this time  Problem: Coping: Goal: Ability to interact with others will improve Outcome: Progressing Pt seen interacting with peers on the unit

## 2015-12-03 NOTE — Plan of Care (Signed)
Problem: Health Behavior/Discharge Planning: Goal: Compliance with treatment plan for underlying cause of condition will improve Outcome: Progressing Patient is medication compliant

## 2015-12-03 NOTE — [facility] Suicide Risk Assessment (Signed)
[facility] INPATIENT:  Family/Significant Other Suicide Prevention Education  Suicide Prevention Education:  Patient Refusal for Family/Significant Other Suicide Prevention Education: The patient Tony Fowler has refused to provide written consent for family/significant other to be provided Family/Significant Other Suicide Prevention Education during admission and/or prior to discharge.  Physician notified.  Chardai Gangemi G. Garnette CzechSampson MSW, LCSWA 12/03/2015, 11:44 AM
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
## 2015-12-03 NOTE — [facility] Group Notes (Signed)
[facility] Group Notes:  (Nursing/MHT/Case Management/Adjunct)  Date:  12/03/2015  Time:  9:57 PM  Type of Therapy:  Evening Wrap-up Group  Participation Level:  Active  Participation Quality:  Appropriate and Attentive  Affect:  Appropriate  Cognitive:  Alert and Appropriate  Insight:  Appropriate and Good  Engagement in Group:  Engaged  Modes of Intervention:  Activity  Summary of Progress/Problems:  Tony MorrowChelsea Nanta [name] 12/03/2015, 9:57 PM

## 2015-12-03 NOTE — Progress Notes (Signed)
[facility]  12/03/2015 11:05 AM Tony CassisHerbert Fowler  MRN:  [id number] Subjective:  Patient is upset with his medications. He says that the Seroquel is too strong and now he has slurred speech and feels lightheaded. He says that the Seroquel makes to be decreased "you don't want a lawsuit". Overnight the patient comply with medications. He is slept well.  Other than what he reported he denies any other side effects or physical complaints today  Per nursing: D: Patient is alert and oriented on the unit this shift. Patient attended and actively participated in groups today. Patient denies suicidal ideation, homicidal ideation, auditory or visual hallucinations at the present time.  A: Scheduled medications are administered to patient as per MD orders. Emotional support and encouragement are provided. Patient is maintained on q.15 minute safety checks. Patient is informed to notify staff with questions or concerns. R: No adverse medication reactions are noted. Patient is cooperative with medication administration and treatment plan today. Patient is non receptive, euphoric , fidigity,paranoid but cooperative on the unit at this time. Patient interacts   with others on the unit this shift. Patient contracts for safety at this time. Patient remains safe at this time.  Principal Problem: Bipolar affective disorder, manic, severe, with psychotic behavior (HCC) Diagnosis:   Patient Active Problem List   Diagnosis Date Noted  . Bipolar affective disorder, manic, severe, with psychotic behavior (HCC) [F31.2] 12/01/2015  . Involuntary commitment [Z04.6] 12/01/2015  . Tobacco use disorder [F17.200] 12/01/2015  . BPH (benign prostatic hyperplasia) [N40.0] 02/22/2015  . Benign hypertension [I10] 09/07/2014  . CKD (chronic kidney disease), stage III [N18.3] 09/07/2014   Total Time spent with patient: 30 minutes  Past Psychiatric History:   Past Medical History:  Past Medical History:  Diagnosis Date   . Bipolar 1 disorder [facility](HCC)     Past Surgical History:  Procedure Laterality Date  . TONSILECTOMY/ADENOIDECTOMY WITH MYRINGOTOMY     Family History:  Family History  Problem Relation Age of Onset  . Heart disease Mother   . Hypertension Mother   . Mental illness Mother   . Heart disease Brother   . Hypertension Brother    Family Psychiatric  History:  Social History:  History  Alcohol Use No     History  Drug Use No    Social History   Social History  . Marital status: Divorced    Spouse name: N/A  . Number of children: N/A  . Years of education: N/A   Social History Main Topics  . Smoking status: Current Every Day Smoker    Packs/day: 0.75    Years: 0.00    Types: Cigarettes  . Smokeless tobacco: Never Used  . Alcohol use No  . Drug use: No  . Sexual activity: Not Asked   Other Topics Concern  . None   Social History Narrative  . None     Current Medications: Current Facility-Administered Medications  Medication Dose Route Frequency Provider Last Rate Last Dose  . acetaminophen (TYLENOL) tablet 650 mg  650 mg Oral Q6H PRN Audery AmelJohn T Clapacs, MD      . alum & mag hydroxide-simeth (MAALOX/MYLANTA) 200-200-20 MG/5ML suspension 30 mL  30 mL Oral Q4H PRN Audery AmelJohn T Clapacs, MD      . divalproex (DEPAKOTE) DR tablet 500 mg  500 mg Oral Q12H Audery AmelJohn T Clapacs, MD   500 mg at 12/03/15 0854  . lithium carbonate (ESKALITH) CR tablet 450 mg  450 mg Oral QHS Tony  T Clapacs, MD   450 mg at 12/02/15 2152  . LORazepam (ATIVAN) tablet 2 mg  2 mg Oral Q4H PRN Tony Footman, MD      . magnesium hydroxide (MILK OF MAGNESIA) suspension 30 mL  30 mL Oral Daily PRN Audery Amel, MD      . nicotine (NICODERM CQ - dosed in mg/24 hours) patch 21 mg  21 mg Transdermal Daily Tony Footman, MD   21 mg at 12/03/15 0854  . QUEtiapine (SEROQUEL) tablet 200 mg  200 mg Oral QHS Tony Footman, MD        Lab Results: No results found for this or any previous  visit (from the past 48 hour(s)).  Blood Alcohol level:  Lab Results  Component Value Date   ETH <5 12/01/2015    Metabolic Disorder Labs: Lab Results  Component Value Date   HGBA1C 6.0 12/01/2015   No results found for: PROLACTIN Lab Results  Component Value Date   CHOL 191 12/01/2015   TRIG 189 (H) 12/01/2015   HDL 46 12/01/2015   CHOLHDL 4.2 12/01/2015   VLDL 38 12/01/2015   LDLCALC 107 (H) 12/01/2015   LDLCALC 124 (H) 02/22/2015    Physical Findings: AIMS: Facial and Oral Movements Muscles of Facial Expression: None, normal Lips and Perioral Area: None, normal Jaw: None, normal Tongue: None, normal,Extremity Movements Upper (arms, wrists, hands, fingers): None, normal Lower (legs, knees, ankles, toes): None, normal, Trunk Movements Neck, shoulders, hips: None, normal, Overall Severity Severity of abnormal movements (highest score from questions above): None, normal Incapacitation due to abnormal movements: None, normal Patient's awareness of abnormal movements (rate only patient's report): No Awareness, Dental Status Current problems with teeth and/or dentures?: No Does patient usually wear dentures?: No  CIWA:    COWS:     Musculoskeletal: Strength & Muscle Tone: within normal limits Gait & Station: normal Patient leans: N/A  Psychiatric Specialty Exam: Physical Exam  Constitutional: He is oriented to person, place, and time. He appears well-developed and well-nourished.  HENT:  Head: Normocephalic and atraumatic.  Eyes: EOM are normal.  Neck: Normal range of motion.  Respiratory: Effort normal.  Musculoskeletal: Normal range of motion.  Neurological: He is alert and oriented to person, place, and time.    Review of Systems  Constitutional: Negative.   HENT: Negative.   Eyes: Negative.   Respiratory: Negative.   Cardiovascular: Negative.   Gastrointestinal: Negative.   Genitourinary: Negative.   Musculoskeletal: Negative.   Skin: Negative.    Neurological: Negative.   Endo/Heme/Allergies: Negative.   Psychiatric/Behavioral: Negative for depression, hallucinations, memory loss, substance abuse and suicidal ideas. The patient is not nervous/anxious and does not have insomnia.     Blood pressure 96/63, pulse 89, temperature 97.6 F (36.4 C), temperature source Oral, resp. rate 18, height 5\' 6"  (1.676 m), weight 59.4 kg (131 lb), SpO2 100 %.Body mass index is 21.14 kg/m.  General Appearance: Fairly Groomed  Eye Contact:  Good  Speech:  Pressured  Volume:  Increased  Mood:  Irritable  Affect:  Congruent  Thought Process:  Irrelevant and Descriptions of Associations: Intact  Orientation:  Full (Time, Place, and Person)  Thought Content:  Hallucinations: None  Suicidal Thoughts:  No  Homicidal Thoughts:  No  Memory:  Immediate;   Good Recent;   Good Remote;   Good  Judgement:  Impaired  Insight:  Shallow  Psychomotor Activity:  Increased  Concentration:  Concentration: Fair and Attention Span: Fair  Recall:  Good  Fund of Knowledge:  Good  Language:  Good  Akathisia:  No  Handed:    AIMS (if indicated):     Assets:  Communication Skills Social Support  ADL's:  Intact  Cognition:  WNL  Sleep:  Number of Hours: 6     Treatment Plan Summary:  Bipolar disorder current episode manic: on Seroquel 300 mg daily at bedtime, lithium 450 mg daily at bedtime and Depakote 500 mg twice a day.  Patient says he is overly sedated this morning and has slurred speech. He says that he felt lightheaded he thinks is the Seroquel. He wants me to decrease the dose because "you don't want a lawsuit".  Due to lightheadedness the Seroquel will be decreased to 200 mg at bedtime.  For agitation and insomnia: continue  Ativan 2 mg every 4 hours when necessary   Hypertension the patient is currently treated with lisinopril 10 mg however this medication can cause or intrusive risk for lithium toxicity therefore it will be discontinued.  If  blood pressure elevated will add a different antihypertensive   Low TSH: recommend to recheck TSH and thyroid function next week  Tobacco use disorder will order nicotine patch 21 mg a day  Diet low sodium  Precautions every 15 minute checks  Hospitalization and status continue involuntary commitment  Follow-up with Dr. Lourdes Sledge  Disposition back to his daughter's house once stable  Labs:  hemoglobin A1c, lipid panel. We will check lithium levels next week along with Depakote levels.    Tony Footman, MD 12/03/2015, 11:05 AM

## 2015-12-03 NOTE — [facility] Group Notes (Signed)
[facility] LCSW Group Therapy  12/03/2015 2:20 PM  Type of Therapy:  Group Therapy  Participation Level:  Pt did not attend group. CSW invited pt to group.   Summary of Progress/Problems:Emotional Regulation: Patients will identify both negative and positive emotions. They will discuss emotions they have difficulty regulating and how they impact their lives. Patients will be asked to identify healthy coping skills to combat unhealthy reactions to negative emotions.    Eithel Ryall G. Garnette CzechSampson MSW, LCSWA 12/03/2015, 2:21 PM
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
## 2015-12-03 NOTE — Progress Notes (Signed)
D: Pt denies SI/HI/AVH. Pt is pleasant and cooperative. Pt can be intrusive at times, but is redirectable. Pt has flight of ideas and loose associations at times.   A: Pt was offered support and encouragement. Pt was given scheduled medications. Pt was encourage to attend groups. Q 15 minute checks were done for safety.   R:Pt attends groups and interacts well with peers and staff. Pt is taking medication. Pt has no complaints at this time.Pt receptive to treatment and safety maintained on unit.

## 2015-12-03 NOTE — Plan of Care (Signed)
Problem: Coping: Goal: Ability to cope will improve Outcome: Not Progressing Patient not able to utilize coping skills at this time [place]own[place] RN   

## 2015-12-03 NOTE — Progress Notes (Signed)
D: Patient is alert and oriented on the unit this shift. Patient attended and actively participated in groups today. Patient denies suicidal ideation, homicidal ideation, auditory or visual hallucinations at the present time.  A: Scheduled medications are administered to patient as per MD orders. Emotional support and encouragement are provided. Patient is maintained on q.15 minute safety checks. Patient is informed to notify staff with questions or concerns. R: No adverse medication reactions are noted. Patient is cooperative with medication administration and treatment plan today. Patient is non receptive, euphoric , fidigity,paranoid but cooperative on the unit at this time. Patient interacts   with others on the unit this shift. Patient contracts for safety at this time. Patient remains safe at this time.

## 2015-12-04 NOTE — [facility] Group Notes (Signed)
[facility] Group Notes:  (Nursing/MHT/Case Management/Adjunct)  Date:  12/04/2015  Time:  9:55 PM  Type of Therapy:  Group Therapy  Participation Level:  Did Not Attend    Veva Holesshley Imani Marki Frede 12/04/2015, 9:55 PM

## 2015-12-04 NOTE — Plan of Care (Signed)
Problem: Health Behavior/Discharge Planning: Goal: Compliance with prescribed medication regimen will improve Outcome: Progressing Pt takes medications as prescribed.  Problem: Coping: Goal: Ability to interact with others will improve Outcome: Progressing Pt seen interacting with peers.

## 2015-12-04 NOTE — Progress Notes (Signed)
Recreation Therapy Notes  Date: 08.28.17 Time: 10:10 am Location: Craft Room  Group Topic: Self-expression  Goal Area(s) Addresses:  Patient will be able to identify a color that represents each emotion. Patient will verbalize benefit of using art as a means of self-expression. Patient will verbalize one emotion experienced while participating in activity.  Behavioral Response: Did not attend  Intervention: The Colors Within Me  Activity: Patients were given blank face worksheets and instructed to pick a color for each emotion they were feeling and show on the face how much of that emotion they were feeling.  Education: LRT educated patients on other forms of self-expression.  Education Outcome: Patient did not attend group.   Clinical Observations/Feedback: Patient did not attend group.  Chemika Nightengale M, LRT/CTRS 12/04/2015 10:49 AM 

## 2015-12-04 NOTE — Progress Notes (Signed)
[facility]  12/04/2015 10:33 AM Tony CassisHerbert Fowler  MRN:  [id number]  Subjective: Tony Fowler is rather disorganized in his thinking. There are racing thoughts. He believes that he came to the hospital with smoking because it is expensive. He wants his lithium and Depakote be discontinued. He wants to take Seroquel only. He believes that he can stay in the hospital for ever as he no longer wants to meet with his daughter. Discussed Xanax take   Principal Problem: Bipolar affective disorder, manic, severe, with psychotic behavior (HCC) Diagnosis:   Patient Active Problem List   Diagnosis Date Noted  . Bipolar affective disorder, manic, severe, with psychotic behavior (HCC) [F31.2] 12/01/2015  . Involuntary commitment [Z04.6] 12/01/2015  . Tobacco use disorder [F17.200] 12/01/2015  . BPH (benign prostatic hyperplasia) [N40.0] 02/22/2015  . Benign hypertension [I10] 09/07/2014  . CKD (chronic kidney disease), stage III [N18.3] 09/07/2014   Total Time spent with patient: 30 minutes  Past Psychiatric History:   Past Medical History:  Past Medical History:  Diagnosis Date  . Bipolar 1 disorder [facility](HCC)     Past Surgical History:  Procedure Laterality Date  . TONSILECTOMY/ADENOIDECTOMY WITH MYRINGOTOMY     Family History:  Family History  Problem Relation Age of Onset  . Heart disease Mother   . Hypertension Mother   . Mental illness Mother   . Heart disease Brother   . Hypertension Brother    Family Psychiatric  History:  Social History:  History  Alcohol Use No     History  Drug Use No    Social History   Social History  . Marital status: Divorced    Spouse name: N/A  . Number of children: N/A  . Years of education: N/A   Social History Main Topics  . Smoking status: Current Every Day Smoker    Packs/day: 0.75    Years: 0.00    Types: Cigarettes  . Smokeless tobacco: Never Used  . Alcohol use No  . Drug use: No  . Sexual activity: Not Asked   Other  Topics Concern  . None   Social History Narrative  . None     Current Medications: Current Facility-Administered Medications  Medication Dose Route Frequency Provider Last Rate Last Dose  . acetaminophen (TYLENOL) tablet 650 mg  650 mg Oral Q6H PRN Audery AmelJohn T Clapacs, MD      . alum & mag hydroxide-simeth (MAALOX/MYLANTA) 200-200-20 MG/5ML suspension 30 mL  30 mL Oral Q4H PRN Audery AmelJohn T Clapacs, MD      . divalproex (DEPAKOTE) DR tablet 500 mg  500 mg Oral Q12H Audery AmelJohn T Clapacs, MD   500 mg at 12/04/15 0827  . lithium carbonate (ESKALITH) CR tablet 450 mg  450 mg Oral QHS Audery AmelJohn T Clapacs, MD   450 mg at 12/03/15 2219  . LORazepam (ATIVAN) tablet 2 mg  2 mg Oral Q4H PRN Jimmy FootmanAndrea Hernandez-Gonzalez, MD      . magnesium hydroxide (MILK OF MAGNESIA) suspension 30 mL  30 mL Oral Daily PRN Audery AmelJohn T Clapacs, MD      . nicotine (NICODERM CQ - dosed in mg/24 hours) patch 21 mg  21 mg Transdermal Daily Jimmy FootmanAndrea Hernandez-Gonzalez, MD   21 mg at 12/04/15 0830  . QUEtiapine (SEROQUEL) tablet 200 mg  200 mg Oral QHS Jimmy FootmanAndrea Hernandez-Gonzalez, MD   200 mg at 12/03/15 2219    Lab Results: No results found for this or any previous visit (from the past 48 hour(s)).  Blood Alcohol  level:  Lab Results  Component Value Date   ETH <5 12/01/2015    Metabolic Disorder Labs: Lab Results  Component Value Date   HGBA1C 6.0 12/01/2015   No results found for: PROLACTIN Lab Results  Component Value Date   CHOL 191 12/01/2015   TRIG 189 (H) 12/01/2015   HDL 46 12/01/2015   CHOLHDL 4.2 12/01/2015   VLDL 38 12/01/2015   LDLCALC 107 (H) 12/01/2015   LDLCALC 124 (H) 02/22/2015    Physical Findings: AIMS: Facial and Oral Movements Muscles of Facial Expression: None, normal Lips and Perioral Area: None, normal Jaw: None, normal Tongue: None, normal,Extremity Movements Upper (arms, wrists, hands, fingers): None, normal Lower (legs, knees, ankles, toes): None, normal, Trunk Movements Neck, shoulders, hips: None,  normal, Overall Severity Severity of abnormal movements (highest score from questions above): None, normal Incapacitation due to abnormal movements: None, normal Patient's awareness of abnormal movements (rate only patient's report): No Awareness, Dental Status Current problems with teeth and/or dentures?: No Does patient usually wear dentures?: No  CIWA:    COWS:     Musculoskeletal: Strength & Muscle Tone: within normal limits Gait & Station: normal Patient leans: N/A  Psychiatric Specialty Exam: Physical Exam  Nursing note and vitals reviewed. Constitutional: He is oriented to person, place, and time.  Neurological: He is oriented to person, place, and time.    Review of Systems  Psychiatric/Behavioral: Positive for hallucinations. Negative for depression, memory loss, substance abuse and suicidal ideas. The patient is not nervous/anxious and does not have insomnia.   All other systems reviewed and are negative.   Blood pressure 136/80, pulse 82, temperature 97.6 F (36.4 C), temperature source Oral, resp. rate 18, height 5\' 6"  (1.676 m), weight 59.4 kg (131 lb), SpO2 100 %.Body mass index is 21.14 kg/m.  General Appearance: Fairly Groomed  Eye Contact:  Good  Speech:  Pressured  Volume:  Increased  Mood:  Irritable  Affect:  Congruent  Thought Process:  Disorganized  Orientation:  Full (Time, Place, and Person)  Thought Content:  Hallucinations: None  Suicidal Thoughts:  No  Homicidal Thoughts:  No  Memory:  Immediate;   Good Recent;   Good Remote;   Good  Judgement:  Impaired  Insight:  Shallow  Psychomotor Activity:  Increased  Concentration:  Concentration: Fair and Attention Span: Fair  Recall:  Good  Fund of Knowledge:  Good  Language:  Good  Akathisia:  No  Handed:    AIMS (if indicated):     Assets:  Communication Skills Social Support  ADL's:  Intact  Cognition:  WNL  Sleep:  Number of Hours: 4.45     Treatment Plan Summary:  Tony Fowler is a  70 year old male with history of bipolar illness admitted in a manic, psychotic episode in the context of medication noncompliance.  1. Bipolar disorder current episode manic. We continued Seroquel 200 mg, lithium 450 mg at bedtime and Depakote 500 mg twice a day.  Lithium and VPA level in am.  2. Agitation and insomnia. We continue  Ativan 2 mg every 4 hours when necessary   3. Hypertension. Lisinopril was discontinued due to increased risk for lithium toxicity. W continue to monitor blood pressure.    4. Low TSH. We will check free T4.   5. Tobacco use disorder. Nicotine patch 21 mg a day is available.  6.  Metabolic syndrome monitoring. Lipid panel and hemoglobin A1care normal.  7. EKG. Normal EKG. QT 354.   8. Disposition.  He will be discharged to home. He will follow-up with Dr. Lesle Chris.        Kristine Linea, MD 12/04/2015, 10:33 AM

## 2015-12-04 NOTE — Progress Notes (Signed)
Patient noted to be quite talkative. He kept repeating that he needed to quit smoking because he and his daughters did not have any money for cigarettes.  He stated that "they" stopped his Lithium and now he smokes a lot, and that that's the reason he is sick. He also was noted to be intrusive and interruptive during community meeting this morning. He talked of how he spoke many languages, attempted to make jokes, laughed and just hyper verbal. He was seen in the dayroom playing cards with peers after lunch. Medication education done. He verbalized understanding.

## 2015-12-04 NOTE — Plan of Care (Signed)
Problem: Coping: Goal: Ability to verbalize frustrations and anger appropriately will improve Outcome: Not Progressing Patient irritable and labile in mood.

## 2015-12-05 LAB — T4, FREE: FREE T4: 0.76 ng/dL (ref 0.61–1.12)

## 2015-12-05 LAB — AMMONIA: AMMONIA: 9 umol/L (ref 9–35)

## 2015-12-05 LAB — VALPROIC ACID LEVEL: VALPROIC ACID LVL: 58 ug/mL (ref 50.0–100.0)

## 2015-12-05 LAB — LITHIUM LEVEL: Lithium Lvl: 0.54 mmol/L — ABNORMAL LOW (ref 0.60–1.20)

## 2015-12-05 MED ORDER — LITHIUM CARBONATE ER 300 MG PO TBCR
300.0000 mg | EXTENDED_RELEASE_TABLET | Freq: Two times a day (BID) | ORAL | Status: DC
  Administered 2015-12-05 – 2015-12-07 (×5): 300 mg via ORAL
  Filled 2015-12-05 (×6): qty 1

## 2015-12-05 MED ORDER — DIVALPROEX SODIUM 500 MG PO DR TAB
500.0000 mg | DELAYED_RELEASE_TABLET | Freq: Three times a day (TID) | ORAL | Status: DC
  Administered 2015-12-05 – 2015-12-10 (×15): 500 mg via ORAL
  Filled 2015-12-05 (×15): qty 1

## 2015-12-05 NOTE — [facility] Group Notes (Signed)
[facility] Group Notes:  (Nursing/MHT/Case Management/Adjunct)  Date:  12/05/2015  Time:  1:57 PM  Type of Therapy:  Psychoeducational Skills  Participation Level:  None  Participation Quality:  Intrusive, Inattentive and Monopolizing  Affect:  Blunted and Irritable  Cognitive:  Appropriate  Insight:  Appropriate  Engagement in Group:  Monopolizing and Off Topic  Modes of Intervention:  Discussion and Education  Summary of Progress/Problems:  Tony Fowler 12/05/2015, 1:57 PM

## 2015-12-05 NOTE — Tx Team (Signed)
Interdisciplinary Treatment and Diagnostic Plan Update  12/05/2015 Time of Session: 10:13 AM  Karn CassisHerbert Robotham MRN: [id number]  Principal Diagnosis: Bipolar affective disorder, manic, severe, with psychotic behavior (HCC)  Secondary Diagnoses: Principal Problem:   Bipolar affective disorder, manic, severe, with psychotic behavior (HCC) Active Problems:   Benign hypertension   CKD (chronic kidney disease), stage III   Involuntary commitment   Tobacco use disorder   Current Medications:  Current Facility-Administered Medications  Medication Dose Route Frequency Provider Last Rate Last Dose  . acetaminophen (TYLENOL) tablet 650 mg  650 mg Oral Q6H PRN Audery AmelJohn T Clapacs, MD      . alum & mag hydroxide-simeth (MAALOX/MYLANTA) 200-200-20 MG/5ML suspension 30 mL  30 mL Oral Q4H PRN Audery AmelJohn T Clapacs, MD      . divalproex (DEPAKOTE) DR tablet 500 mg  500 mg Oral Q12H Audery AmelJohn T Clapacs, MD   500 mg at 12/05/15 0842  . lithium carbonate (ESKALITH) CR tablet 450 mg  450 mg Oral QHS Audery AmelJohn T Clapacs, MD   450 mg at 12/04/15 2213  . LORazepam (ATIVAN) tablet 2 mg  2 mg Oral Q4H PRN Jimmy FootmanAndrea Hernandez-Gonzalez, MD      . magnesium hydroxide (MILK OF MAGNESIA) suspension 30 mL  30 mL Oral Daily PRN Audery AmelJohn T Clapacs, MD      . nicotine (NICODERM CQ - dosed in mg/24 hours) patch 21 mg  21 mg Transdermal Daily Jimmy FootmanAndrea Hernandez-Gonzalez, MD   21 mg at 12/05/15 0844  . QUEtiapine (SEROQUEL) tablet 200 mg  200 mg Oral QHS Jimmy FootmanAndrea Hernandez-Gonzalez, MD   200 mg at 12/04/15 2213    PTA Medications: Prescriptions Prior to Admission  Medication Sig Dispense Refill Last Dose  . divalproex (DEPAKOTE ER) 500 MG 24 hr tablet Take 500 mg by mouth 2 (two) times daily.   unknown at unknown  . lisinopril (PRINIVIL,ZESTRIL) 10 MG tablet Take 1 tablet (10 mg total) by mouth daily. 90 tablet 4 unknown at unknown  . lithium carbonate 300 MG capsule Take 300 mg by mouth 2 (two) times daily with a meal.   Not Taking at Unknown time  .  Multiple Vitamin (MULTIVITAMIN) tablet Take 1 tablet by mouth daily.   unknown at unknown  . QUEtiapine (SEROQUEL) 300 MG tablet Take 1 tablet (300 mg total) by mouth at bedtime. 30 tablet 2 unknown at unknown    Treatment Modalities: Medication Management, Group therapy, Case management,  1 to 1 session with clinician, Psychoeducation, Recreational therapy.   Physician Treatment Plan for Primary Diagnosis: Bipolar affective disorder, manic, severe, with psychotic behavior (HCC) Long Term Goal(s): Improvement in symptoms so as ready for discharge  Short Term Goals: Ability to demonstrate self-control will improve, Ability to identify and develop effective coping behaviors will improve, Ability to maintain clinical measurements within normal limits will improve and Compliance with prescribed medications will improve  Medication Management: Evaluate patient's response, side effects, and tolerance of medication regimen.  Therapeutic Interventions: 1 to 1 sessions, Unit Group sessions and Medication administration.  Evaluation of Outcomes: Progressing  Physician Treatment Plan for Secondary Diagnosis: Principal Problem:   Bipolar affective disorder, manic, severe, with psychotic behavior (HCC) Active Problems:   Benign hypertension   CKD (chronic kidney disease), stage III   Involuntary commitment   Tobacco use disorder   Long Term Goal(s): Improvement in symptoms so as ready for discharge  Short Term Goals: Ability to demonstrate self-control will improve, Ability to identify and develop effective coping behaviors will improve, Ability  to maintain clinical measurements within normal limits will improve and Compliance with prescribed medications will improve  Medication Management: Evaluate patient's response, side effects, and tolerance of medication regimen.  Therapeutic Interventions: 1 to 1 sessions, Unit Group sessions and Medication administration.  Evaluation of Outcomes:  Progressing   RN Treatment Plan for Primary Diagnosis: Bipolar affective disorder, manic, severe, with psychotic behavior (HCC) Long Term Goal(s): Knowledge of disease and therapeutic regimen to maintain health will improve  Short Term Goals: Ability to verbalize frustration and anger appropriately will improve, Ability to demonstrate self-control and Compliance with prescribed medications will improve  Medication Management: RN will administer medications as ordered by provider, will assess and evaluate patient's response and provide education to patient for prescribed medication. RN will report any adverse and/or side effects to prescribing provider.  Therapeutic Interventions: 1 on 1 counseling sessions, Psychoeducation, Medication administration, Evaluate responses to treatment, Monitor vital signs and CBGs as ordered, Perform/monitor CIWA, COWS, AIMS and Fall Risk screenings as ordered, Perform wound care treatments as ordered.  Evaluation of Outcomes: Progressing   LCSW Treatment Plan for Primary Diagnosis: Bipolar affective disorder, manic, severe, with psychotic behavior (HCC) Long Term Goal(s): Safe transition to appropriate next level of care at discharge, Engage patient in therapeutic group addressing interpersonal concerns.  Short Term Goals: Engage patient in aftercare planning with referrals and resources, Increase social support, Increase ability to appropriately verbalize feelings, Increase emotional regulation, Facilitate acceptance of mental health diagnosis and concerns and Increase skills for wellness and recovery  Therapeutic Interventions: Assess for all discharge needs, 1 to 1 time with Social worker, Explore available resources and support systems, Assess for adequacy in community support network, Educate family and significant other(s) on suicide prevention, Complete Psychosocial Assessment, Interpersonal group therapy.  Evaluation of Outcomes: Progressing   Progress  in Treatment: Attending groups: No Participating in groups: No Taking medication as prescribed: Yes, MD continues to assess for medication changes as needed Toleration medication: Yes, no side effects reported at this time Family/Significant other contact made: CSW, still assessing for appropriate contacts Patient understands diagnosis: Yes Discussing patient identified problems/goals with staff: Yes Medical problems stabilized or resolved: Yes Denies suicidal/homicidal ideation: Yes Issues/concerns per patient self-inventory: None Other: N/A  New problem(s) identified: None identified at this time.   New Short Term/Long Term Goal(s): None identified at this time.   Discharge Plan or Barriers: CSW still assessing for appropriate referrals  Reason for Continuation of Hospitalization: Anxiety Delusions  Depression Hallucinations Homicidal ideation Mania Medical Issues Medication stabilization Suicidal ideation Withdrawal symptoms  Estimated Length of Stay: 3-5 days  Attendees: Patient: Normand Damron 12/05/2015  9:40 AM  Physician: Dr. Jennet Maduro, MD 12/05/2015  9:40 AM  Nursing: Hulan Amato, RN 12/05/2015  9:40 AM  RN Care Manager: 12/05/2015  9:40 AM  Social Worker: Dorothe Pea. Durward Parcel    12/05/2015  9:40 AM  Recreational Therapist: Hershal Coria, LRT   12/05/2015  9:40 AM  Social Worker: Jake Shark, LCSW 12/05/2015  9:40 AM  Other:  12/05/2015  9:40 AM  Other: 12/05/2015  9:40 AM    Scribe for Treatment Team:  Dorothe Pea. Pavle Wiler MSW, LCSWA, LCAS

## 2015-12-05 NOTE — Progress Notes (Signed)
D: Pt continues to be intrusive and hyperverbal. He is seen in the milieu interacting with peers. Pt attempts to make jokes, stating "these medications alter my mood. That and people of the opposite sex." Pt is redirectable. He denies SI/HI/AVH at this time. A: Emotional support and encouragement provided. Medications administered with education. q15 minute safety checks maintained. R: Pt remains free from harm. Will continue to monitor.

## 2015-12-05 NOTE — Progress Notes (Signed)
Recreation Therapy Notes  Date: 08.29.17 Time: 9:30 am Location: Craft Room  Group Topic: Goal Setting  Goal Area(s) Addresses:  Patient will write at least one goal. Patient will write at least one obstacle.  Behavioral Response: Did not attend  Intervention: Recovery Goal Chart  Activity: Patients were instructed to make a Recovery Goal Chart including goals, obstacles, the date they started working on their goals, and the date they achieved their goals.  Education: LRT educated patients on ways they can celebrate reaching their goals in a healthy way.  Education Outcome: Patient did not attend group.   Clinical Observations/Feedback: Patient did not attend group.  Jacquelynn CreeGreene,Maelynn Moroney M., LRT/CTRS 12/05/2015 10:08 AM

## 2015-12-05 NOTE — Plan of Care (Signed)
Problem: Education: Goal: Mental status will improve Outcome: Progressing Pt is disorganized, jumping from topics about mickey mouse and the terminator.   Problem: Coping: Goal: Ability to use eye contact when communicating with others will improve Outcome: Progressing Pt maintains eye contact while communicating.

## 2015-12-05 NOTE — Progress Notes (Signed)
[facility] MD Progress Note  12/05/2015 1:40 PM Lennell Shanks  MRN:  [id number]  Subjective: Mr. Cheramie is still very disorganized in his thinking. He met with treatment team but has not been able to answer even simple questions. He still believes that he was admitted to the hospital for smoking cessation. He THAT he no longer has part D Medicare therefore he cannot afford his medications. He no longer intends to return to his daughter's house for safety reasons and wants to stay in the hospital as he knows he cannot afford independent living.   Principal Problem: Bipolar affective disorder, manic, severe, with psychotic behavior ([place]) Diagnosis:   Patient Active Problem List   Diagnosis Date Noted  . Bipolar affective disorder, manic, severe, with psychotic behavior ([place]) [F31.2] 12/01/2015  . Involuntary commitment [Z04.6] 12/01/2015  . Tobacco use disorder [F17.200] 12/01/2015  . BPH (benign prostatic hyperplasia) [N40.0] 02/22/2015  . Benign hypertension [I10] 09/07/2014  . CKD (chronic kidney disease), stage III [N18.3] 09/07/2014   Total Time spent with patient: 30 minutes  Past Psychiatric History:   Past Medical History:  Past Medical History:  Diagnosis Date  . Bipolar 1 disorder [facility])     Past Surgical History:  Procedure Laterality Date  . TONSILECTOMY/ADENOIDECTOMY WITH MYRINGOTOMY     Family History:  Family History  Problem Relation Age of Onset  . Heart disease Mother   . Hypertension Mother   . Mental illness Mother   . Heart disease Brother   . Hypertension Brother    Family Psychiatric  History:  Social History:  History  Alcohol Use No     History  Drug Use No    Social History   Social History  . Marital status: Divorced    Spouse name: N/A  . Number of children: N/A  . Years of education: N/A   Social History Main Topics  . Smoking status: Current Every Day Smoker    Packs/day: 0.75    Years: 0.00    Types: Cigarettes  . Smokeless tobacco:  Never Used  . Alcohol use No  . Drug use: No  . Sexual activity: Not Asked   Other Topics Concern  . None   Social History Narrative  . None     Current Medications: Current Facility-Administered Medications  Medication Dose Route Frequency Provider Last Rate Last Dose  . acetaminophen (TYLENOL) tablet 650 mg  650 mg Oral Q6H PRN Gonzella Lex, MD      . alum & mag hydroxide-simeth (MAALOX/MYLANTA) 200-200-20 MG/5ML suspension 30 mL  30 mL Oral Q4H PRN Gonzella Lex, MD      . divalproex (DEPAKOTE) DR tablet 500 mg  500 mg Oral Q12H Gonzella Lex, MD   500 mg at 12/05/15 0842  . lithium carbonate (ESKALITH) CR tablet 450 mg  450 mg Oral QHS Gonzella Lex, MD   450 mg at 12/04/15 2213  . LORazepam (ATIVAN) tablet 2 mg  2 mg Oral Q4H PRN Hildred Priest, MD      . magnesium hydroxide (MILK OF MAGNESIA) suspension 30 mL  30 mL Oral Daily PRN Gonzella Lex, MD      . nicotine (NICODERM CQ - dosed in mg/24 hours) patch 21 mg  21 mg Transdermal Daily Hildred Priest, MD   21 mg at 12/05/15 0844  . QUEtiapine (SEROQUEL) tablet 200 mg  200 mg Oral QHS Hildred Priest, MD   200 mg at 12/04/15 2213    Lab Results:  Results for orders placed or performed during the hospital encounter of 12/01/15 (from the past 48 hour(s))  Lithium level     Status: Abnormal   Collection Time: 12/05/15  7:28 AM  Result Value Ref Range   Lithium Lvl 0.54 (L) 0.60 - 1.20 mmol/L  Valproic acid level     Status: None   Collection Time: 12/05/15  7:28 AM  Result Value Ref Range   Valproic Acid Lvl 58 50.0 - 100.0 ug/mL  T4, free     Status: None   Collection Time: 12/05/15  7:28 AM  Result Value Ref Range   Free T4 0.76 0.61 - 1.12 ng/dL    Comment: (NOTE) Biotin ingestion may interfere with free T4 tests. If the results are inconsistent with the TSH level, previous test results, or the clinical presentation, then consider biotin interference. If needed, order repeat  testing after stopping biotin.   Ammonia     Status: None   Collection Time: 12/05/15  7:28 AM  Result Value Ref Range   Ammonia 9 9 - 35 umol/L    Blood Alcohol level:  Lab Results  Component Value Date   ETH <5 01/60/1093    Metabolic Disorder Labs: Lab Results  Component Value Date   HGBA1C 6.0 12/01/2015   No results found for: PROLACTIN Lab Results  Component Value Date   CHOL 191 12/01/2015   TRIG 189 (H) 12/01/2015   HDL 46 12/01/2015   CHOLHDL 4.2 12/01/2015   VLDL 38 12/01/2015   LDLCALC 107 (H) 12/01/2015   LDLCALC 124 (H) 02/22/2015    Physical Findings: AIMS: Facial and Oral Movements Muscles of Facial Expression: None, normal Lips and Perioral Area: None, normal Jaw: None, normal Tongue: None, normal,Extremity Movements Upper (arms, wrists, hands, fingers): None, normal Lower (legs, knees, ankles, toes): None, normal, Trunk Movements Neck, shoulders, hips: None, normal, Overall Severity Severity of abnormal movements (highest score from questions above): None, normal Incapacitation due to abnormal movements: None, normal Patient's awareness of abnormal movements (rate only patient's report): No Awareness, Dental Status Current problems with teeth and/or dentures?: No Does patient usually wear dentures?: No  CIWA:    COWS:     Musculoskeletal: Strength & Muscle Tone: within normal limits Gait & Station: normal Patient leans: N/A  Psychiatric Specialty Exam: Physical Exam  Nursing note and vitals reviewed. Constitutional: He is oriented to person, place, and time.  Neurological: He is oriented to person, place, and time.    Review of Systems  Psychiatric/Behavioral: Positive for hallucinations. Negative for depression, memory loss, substance abuse and suicidal ideas. The patient is not nervous/anxious and does not have insomnia.   All other systems reviewed and are negative.   Blood pressure (!) 137/94, pulse 86, temperature 97.9 F (36.6 C),  temperature source Oral, resp. rate 18, height 5' 6"  (1.676 m), weight 59.4 kg (131 lb), SpO2 100 %.Body mass index is 21.14 kg/m.  General Appearance: Fairly Groomed  Eye Contact:  Good  Speech:  Pressured  Volume:  Increased  Mood:  Irritable  Affect:  Congruent  Thought Process:  Disorganized  Orientation:  Full (Time, Place, and Person)  Thought Content:  Hallucinations: None  Suicidal Thoughts:  No  Homicidal Thoughts:  No  Memory:  Immediate;   Good Recent;   Good Remote;   Good  Judgement:  Impaired  Insight:  Shallow  Psychomotor Activity:  Increased  Concentration:  Concentration: Fair and Attention Span: Fair  Recall:  Good  Fund of  Knowledge:  Good  Language:  Good  Akathisia:  No  Handed:    AIMS (if indicated):     Assets:  Communication Skills Social Support  ADL's:  Intact  Cognition:  WNL  Sleep:  Number of Hours: 5.25     Treatment Plan Summary:  Mr. Butt is a 70 year old male with history of bipolar illness admitted in a manic, psychotic episode in the context of medication noncompliance.  1. Bipolar disorder current episode manic. We continued Seroquel 200 mg, lithium 450 mg at bedtime and Depakote 500 mg twice a day.  Lithium 0.54, VPA 58, ammonia 9. We will increase Lithium and Depakote dose.   2. Agitation and insomnia. We continue  Ativan 2 mg every 4 hours when necessary   3. Hypertension. Lisinopril was discontinued due to increased risk for lithium toxicity. W continue to monitor blood pressure.    4. Low TSH. Free T4 is normal.  5. Tobacco use disorder. Nicotine patch 21 mg a day is available.  6.  Metabolic syndrome monitoring. Lipid panel and hemoglobin A1care normal.  7. EKG. Normal EKG. QT 354.   8. Disposition. He will be discharged to home. He will follow-up with Dr. Eduard Clos.        Orson Slick, MD 12/05/2015, 1:40 PM

## 2015-12-05 NOTE — Progress Notes (Signed)
Recreation Therapy Notes  Date: 08.29.17 Time: 3:00 pm Location: Craft Room  Group Topic: Self-expression  Goal Area(s) Addresses:  Patient will be able to identify a color that represents each emotion. Patient will verbalize benefit of using art as a means of self-expression. Patient will verbalize one emotion experienced while participating in activity.  Behavioral Response: Intermittently Attentive, Interactive, Disruptive  Intervention: The Colors Within Me  Activity: Patients were given a blank face worksheet and were instructed to pick a color for each emotion they were feeling and to show on the face how much of that emotion they were feeling.  Education: LRT educated patients on other forms of self-expression.  Education Outcome: In group clarification offered   Clinical Observations/Feedback: Patient drew self-portrait. Patient would laugh during the activity portion of group. Patient attempted to contribute to group discussion but was talking about his daughters and being in treatment for 30 years. LRT attempted to redirect patient. Patient compliant.  Jacquelynn CreeGreene,Shirlette Scarber M, LRT/CTRS 12/05/2015 4:56 PM

## 2015-12-05 NOTE — Plan of Care (Signed)
Problem: Coping: Goal: Ability to cope will improve Outcome: Progressing Patient attended a therapy group.

## 2015-12-06 NOTE — Progress Notes (Signed)
[facility]  12/06/2015 1:46 PM Karn CassisHerbert Igoe  MRN:  [id number]  Subjective: [name] remains disorganized, irritable and intrusive.  His speech in nonsensical. He was very upset that we increased his Lithium and Depakote dose and threatens to sue. He no longer intends to return to his daughter's house for safety reasons and wants to stay in the hospital as he knows he cannot afford independent living.   Principal Problem: Bipolar affective disorder, manic, severe, with psychotic behavior ([facility]) Diagnosis:   Patient Active Problem List   Diagnosis Date Noted  . Bipolar affective disorder, manic, severe, with psychotic behavior ([facility]) [F31.2] 12/01/2015  . Involuntary commitment [Z04.6] 12/01/2015  . Tobacco use disorder [F17.200] 12/01/2015  . BPH (benign prostatic hyperplasia) [N40.0] 02/22/2015  . Benign hypertension [I10] 09/07/2014  . CKD (chronic kidney disease), stage III [N18.3] 09/07/2014   Total Time spent with patient: 30 minutes  Past Psychiatric History:   Past Medical History:  Past Medical History:  Diagnosis Date  . Bipolar 1 disorder [facility])     Past Surgical History:  Procedure Laterality Date  . TONSILECTOMY/ADENOIDECTOMY WITH MYRINGOTOMY     Family History:  Family History  Problem Relation Age of Onset  . Heart disease Mother   . Hypertension Mother   . Mental illness Mother   . Heart disease Brother   . Hypertension Brother    Family Psychiatric  History:  Social History:  History  Alcohol Use No     History  Drug Use No    Social History   Social History  . Marital status: Divorced    Spouse name: N/A  . Number of children: N/A  . Years of education: N/A   Social History Main Topics  . Smoking status: Current Every Day Smoker    Packs/day: 0.75    Years: 0.00    Types: Cigarettes  . Smokeless tobacco: Never Used  . Alcohol use No  . Drug use: No  . Sexual activity: Not Asked   Other Topics Concern  . None   Social  History Narrative  . None     Current Medications: Current Facility-Administered Medications  Medication Dose Route Frequency Provider Last Rate Last Dose  . acetaminophen (TYLENOL) tablet 650 mg  650 mg Oral Q6H PRN Audery AmelJohn T Clapacs, MD      . alum & mag hydroxide-simeth (MAALOX/MYLANTA) 200-200-20 MG/5ML suspension 30 mL  30 mL Oral Q4H PRN Audery AmelJohn T Clapacs, MD      . divalproex (DEPAKOTE) DR tablet 500 mg  500 mg Oral Q8H Abem Shaddix B Zilah Villaflor, MD   500 mg at 12/06/15 0631  . lithium carbonate (LITHOBID) CR tablet 300 mg  300 mg Oral Q12H Kaylinn Dedic B [place] Rollyson, MD   300 mg at 12/06/15 0911  . LORazepam (ATIVAN) tablet 2 mg  2 mg Oral Q4H PRN Jimmy FootmanAndrea Hernandez-Gonzalez, MD      . magnesium hydroxide (MILK OF MAGNESIA) suspension 30 mL  30 mL Oral Daily PRN Audery AmelJohn T Clapacs, MD      . nicotine (NICODERM CQ - dosed in mg/24 hours) patch 21 mg  21 mg Transdermal Daily Jimmy FootmanAndrea Hernandez-Gonzalez, MD   21 mg at 12/06/15 0912  . QUEtiapine (SEROQUEL) tablet 200 mg  200 mg Oral QHS Jimmy FootmanAndrea Hernandez-Gonzalez, MD   200 mg at 12/05/15 2108    Lab Results:  Results for orders placed or performed during the hospital encounter of 12/01/15 (from the past 48 hour(s))  Lithium level  Status: Abnormal   Collection Time: 12/05/15  7:28 AM  Result Value Ref Range   Lithium Lvl 0.54 (L) 0.60 - 1.20 mmol/L  Valproic acid level     Status: None   Collection Time: 12/05/15  7:28 AM  Result Value Ref Range   Valproic Acid Lvl 58 50.0 - 100.0 ug/mL  T4, free     Status: None   Collection Time: 12/05/15  7:28 AM  Result Value Ref Range   Free T4 0.76 0.61 - 1.12 ng/dL    Comment: (NOTE) Biotin ingestion may interfere with free T4 tests. If the results are inconsistent with the TSH level, previous test results, or the clinical presentation, then consider biotin interference. If needed, order repeat testing after stopping biotin.   Ammonia     Status: None   Collection Time: 12/05/15  7:28 AM  Result Value  Ref Range   Ammonia 9 9 - 35 umol/L    Blood Alcohol level:  Lab Results  Component Value Date   ETH <5 12/01/2015    Metabolic Disorder Labs: Lab Results  Component Value Date   HGBA1C 6.0 12/01/2015   No results found for: PROLACTIN Lab Results  Component Value Date   CHOL 191 12/01/2015   TRIG 189 (H) 12/01/2015   HDL 46 12/01/2015   CHOLHDL 4.2 12/01/2015   VLDL 38 12/01/2015   LDLCALC 107 (H) 12/01/2015   LDLCALC 124 (H) 02/22/2015    Physical Findings: AIMS: Facial and Oral Movements Muscles of Facial Expression: None, normal Lips and Perioral Area: None, normal Jaw: None, normal Tongue: None, normal,Extremity Movements Upper (arms, wrists, hands, fingers): None, normal Lower (legs, knees, ankles, toes): None, normal, Trunk Movements Neck, shoulders, hips: None, normal, Overall Severity Severity of abnormal movements (highest score from questions above): None, normal Incapacitation due to abnormal movements: None, normal Patient's awareness of abnormal movements (rate only patient's report): No Awareness, Dental Status Current problems with teeth and/or dentures?: No Does patient usually wear dentures?: No  CIWA:    COWS:     Musculoskeletal: Strength & Muscle Tone: within normal limits Gait & Station: normal Patient leans: N/A  Psychiatric Specialty Exam: Physical Exam  Nursing note and vitals reviewed. Constitutional: He is oriented to person, place, and time.  Neurological: He is oriented to person, place, and time.    Review of Systems  Psychiatric/Behavioral: Positive for hallucinations. Negative for depression, memory loss, substance abuse and suicidal ideas. The patient is not nervous/anxious and does not have insomnia.   All other systems reviewed and are negative.   Blood pressure 129/88, pulse 81, temperature 97.9 F (36.6 C), temperature source Oral, resp. rate 18, height 5\' 6"  (1.676 m), weight 59.4 kg (131 lb), SpO2 100 %.Body mass index  is 21.14 kg/m.  General Appearance: Fairly Groomed  Eye Contact:  Good  Speech:  Pressured  Volume:  Increased  Mood:  Irritable  Affect:  Congruent  Thought Process:  Disorganized  Orientation:  Full (Time, Place, and Person)  Thought Content:  Hallucinations: None  Suicidal Thoughts:  No  Homicidal Thoughts:  No  Memory:  Immediate;   Good Recent;   Good Remote;   Good  Judgement:  Impaired  Insight:  Shallow  Psychomotor Activity:  Increased  Concentration:  Concentration: Fair and Attention Span: Fair  Recall:  Good  Fund of Knowledge:  Good  Language:  Good  Akathisia:  No  Handed:    AIMS (if indicated):     Assets:  Communication Skills Social Support  ADL's:  Intact  Cognition:  WNL  Sleep:  Number of Hours: 6.25     Treatment Plan Summary:  Mr. Schnapp is a 70 year old male with history of bipolar illness admitted in a manic, psychotic episode in the context of medication noncompliance.  1. Bipolar disorder current episode manic. We continued Seroquel 200 mg, lithium 450 mg at bedtime and Depakote 500 mg twice a day.  Lithium 0.54, VPA 58, ammonia 9. We will increase Lithium and Depakote dose.   2. Agitation and insomnia. We continue  Ativan 2 mg every 4 hours when necessary   3. Hypertension. Lisinopril was discontinued due to increased risk for lithium toxicity. W continue to monitor blood pressure.    4. Low TSH. Free T4 is normal.  5. Tobacco use disorder. Nicotine patch 21 mg a day is available.  6.  Metabolic syndrome monitoring. Lipid panel and hemoglobin A1care normal.  7. EKG. Normal EKG. QT 354.   8. Disposition. He will be discharged to home. He will follow-up with Dr. Lesle Chris.        Kristine Linea, MD 12/06/2015, 1:46 PM

## 2015-12-06 NOTE — [facility] Group Notes (Signed)
[facility] LCSW Group Therapy  12/06/2015 4:01 PM  Type of Therapy/Topic:  Group Therapy:  Emotion Regulation  Participation Level:  Active  Mood:  Reports Okay mood, although presents irritable  Description of Group:    The purpose of this group is to assist patients in learning to regulate negative emotions and experience positive emotions. Patients will be guided to discuss ways in which they have been vulnerable to their negative emotions. These vulnerabilities will be juxtaposed with experiences of positive emotions or situations, and patients challenged to use positive emotions to combat negative ones. Special emphasis will be placed on coping with negative emotions in conflict situations, and patients will process healthy conflict resolution skills.  Therapeutic Goals: 1. Patient will identify two positive emotions or experiences to reflect on in order to balance out negative emotions:  2. Patient will label two or more emotions that they find the most difficult to experience:  3. Patient will be able to demonstrate positive conflict resolution skills through discussion or role plays:   Summary of Patient Progress: Pt unable to achieve above therapeutic goals.  Pt participated actively, was supportive of others, but often required redirection from tangent to group related topic.  Presents disorganized at times, unclear if he is able to process topic and remarks are often unrelated to group discussion.  Therapeutic Modalities:   Cognitive Behavioral Therapy Feelings Identification Dialectical Behavioral Therapy   Glennon MacSara P Vue Pavon, MSW, LCSW 12/06/2015, 4:01 PM

## 2015-12-06 NOTE — [facility] Group Notes (Signed)
[facility] Group Notes:  (Nursing/MHT/Case Management/Adjunct)  Date:  12/06/2015  Time:  10:56 PM  Type of Therapy:  Evening Wrap-up Group  Participation Level:  Active  Participation Quality:  Appropriate  Affect:  Appropriate  Cognitive:  Alert and Appropriate  Insight:  Appropriate and Improving  Engagement in Group:  Developing/Improving and Engaged  Modes of Intervention:  Activity and Discussion  Summary of Progress/Problems:  Tony MorrowChelsea Fowler Tony Fowler 12/06/2015, 10:56 PM

## 2015-12-06 NOTE — Progress Notes (Signed)
D: Pt continues to be disorganized in his thinking. Sexual inappropriate comments made at times Pt denies SI/HI/AVH at this time. A: Emotional support and encouragement provided. Medications administered with education. q15 minute safety checks maintained. R: Pt remains free from harm. Will continue to monitor.

## 2015-12-06 NOTE — Progress Notes (Signed)
D: Pt continues to be disorganized in his thinking. He mentions Micky Mouse and the terminator during interaction with Clinical research associatewriter, although these topics were not related to conversation. Pt denies SI/HI/AVH at this time. A: Emotional support and encouragement provided. Medications administered with education. q15 minute safety checks maintained. R: Pt remains free from harm. Will continue to monitor.

## 2015-12-06 NOTE — [facility] Group Notes (Signed)
[facility] Group Notes:  (Nursing/MHT/Case Management/Adjunct)  Date:  12/06/2015  Time:  5:55 AM  Type of Therapy:  Psychoeducational Skills  Participation Level:  Active  Participation Quality:  Appropriate and Sharing  Affect:  Appropriate  Cognitive:  Appropriate  Insight:  Appropriate and Good  Engagement in Group:  Engaged  Modes of Intervention:  Discussion, Socialization and Support  Summary of Progress/Problems:  Chancy MilroyLaquanda Y Thelma Lorenzetti 12/06/2015, 5:55 AM

## 2015-12-07 MED ORDER — QUETIAPINE FUMARATE 200 MG PO TABS
300.0000 mg | ORAL_TABLET | Freq: Every day | ORAL | Status: DC
  Administered 2015-12-07 – 2015-12-13 (×7): 300 mg via ORAL
  Filled 2015-12-07 (×7): qty 1

## 2015-12-07 NOTE — [facility] Group Notes (Signed)
[facility] LCSW Group Therapy Note  Type of Therapy and Topic:  Group Therapy:  Goals Group: SMART Goals  Participation Level:  Pt did not attend group. CSW invited pt to group.   Description of Group:   The purpose of a daily goals group is to assist and guide patients in setting recovery/wellness-related goals.  The objective is to set goals as they relate to the crisis in which they were admitted. Patients will be using SMART goal modalities to set measurable goals.  Characteristics of realistic goals will be discussed and patients will be assisted in setting and processing how one will reach their goal. Facilitator will also assist patients in applying interventions and coping skills learned in psycho-education groups to the SMART goal and process how one will achieve defined goal.  Therapeutic Goals: -Patients will develop and document one goal related to or their crisis in which brought them into treatment. -Patients will be guided by LCSW using SMART goal setting modality in how to set a measurable, attainable, realistic and time sensitive goal.  -Patients will process barriers in reaching goal. -Patients will process interventions in how to overcome and successful in reaching goal.   Summary of Patient Progress:  Patient Goal: Pt did not attend group. CSW invited pt to group.    Therapeutic Modalities:   Motivational Interviewing Engineer, manufacturing systemsCognitive Behavioral Therapy Crisis Intervention Model SMART goals setting  Carmellia Kreisler G. Garnette CzechSampson MSW, [facility] LLCCSWA 12/07/2015 12:25 PM

## 2015-12-07 NOTE — Plan of Care (Signed)
Problem: Coping: Goal: Ability to verbalize feelings will improve Outcome: Progressing Patient remains confused but is able to verbalize his feelings.  Patient denies SI but remains paranoid.  Will continue to monitor.

## 2015-12-07 NOTE — Plan of Care (Signed)
Problem: Safety: Goal: Ability to remain free from injury will improve Outcome: Progressing Pt has remained free from injury   

## 2015-12-07 NOTE — Progress Notes (Signed)
D: Observed pt in dayroom interacting with peers. Patient alert and oriented x4. Patient denies SI/HI/AVH. Pt affect is labile and irritable. Pt as initially pleasant stating his day was "fine."  When medications wer ebeing discussed pt started making demands at what his correct dosage is and stating multiple times that the doctor chaning his dose is "illegal..Marland Kitchen.I'll get a Clinical research associatelawyer...you'll lose your job."  Pt blames his daughter, his wife, and other individuals and talks about getting them arrested. Pt rated depression 5/10 and denied depression. A: Offered active listening and support. Provided therapeutic communication. Administered scheduled medications with minimal encouragment.  R: Pt cooperative with redirection. Despite pt's claims about his medication, pt wasmedication compliant. Will continue Q15 min. checks. Safety maintained.

## 2015-12-07 NOTE — [facility] Group Notes (Signed)
[facility] Group Notes:  (Nursing/MHT/Case Management/Adjunct)  Date:  12/07/2015  Time:  4:39 PM  Type of Therapy:  Psychoeducational Skills  Participation Level:  Active  Participation Quality:  Intrusive  Affect:  Tearful  Cognitive:  Disorganized and Lacking  Insight:  Lacking  Engagement in Group:  Limited and Off Topic  Modes of Intervention:  Discussion, Education and Support  Summary of Progress/Problems:  Noelle PennerKristen J Khayla Koppenhaver 12/07/2015, 4:39 PM

## 2015-12-07 NOTE — Progress Notes (Signed)
[facility]  12/07/2015 1:20 PM Karn CassisHerbert Pagel  MRN:  [id number]  Subjective: [name] remains irritable, intrusive and litigious. His thinking is disorganized, his speech in nonsensical. He accepts medications with much encouragement.    Principal Problem: Bipolar affective disorder, manic, severe, with psychotic behavior (HCC) Diagnosis:   Patient Active Problem List   Diagnosis Date Noted  . Bipolar affective disorder, manic, severe, with psychotic behavior (HCC) [F31.2] 12/01/2015  . Involuntary commitment [Z04.6] 12/01/2015  . Tobacco use disorder [F17.200] 12/01/2015  . BPH (benign prostatic hyperplasia) [N40.0] 02/22/2015  . Benign hypertension [I10] 09/07/2014  . CKD (chronic kidney disease), stage III [N18.3] 09/07/2014   Total Time spent with patient: 30 minutes  Past Psychiatric History:   Past Medical History:  Past Medical History:  Diagnosis Date  . Bipolar 1 disorder [facility](HCC)     Past Surgical History:  Procedure Laterality Date  . TONSILECTOMY/ADENOIDECTOMY WITH MYRINGOTOMY     Family History:  Family History  Problem Relation Age of Onset  . Heart disease Mother   . Hypertension Mother   . Mental illness Mother   . Heart disease Brother   . Hypertension Brother    Family Psychiatric  History:  Social History:  History  Alcohol Use No     History  Drug Use No    Social History   Social History  . Marital status: Divorced    Spouse name: N/A  . Number of children: N/A  . Years of education: N/A   Social History Main Topics  . Smoking status: Current Every Day Smoker    Packs/day: 0.75    Years: 0.00    Types: Cigarettes  . Smokeless tobacco: Never Used  . Alcohol use No  . Drug use: No  . Sexual activity: Not Asked   Other Topics Concern  . None   Social History Narrative  . None     Current Medications: Current Facility-Administered Medications  Medication Dose Route Frequency Provider Last Rate Last Dose  .  acetaminophen (TYLENOL) tablet 650 mg  650 mg Oral Q6H PRN Audery AmelJohn T Clapacs, MD      . alum & mag hydroxide-simeth (MAALOX/MYLANTA) 200-200-20 MG/5ML suspension 30 mL  30 mL Oral Q4H PRN Audery AmelJohn T Clapacs, MD      . divalproex (DEPAKOTE) DR tablet 500 mg  500 mg Oral Q8H Jenisis Harmsen B Jadien Lehigh, MD   500 mg at 12/07/15 0601  . lithium carbonate (LITHOBID) CR tablet 300 mg  300 mg Oral Q12H Doak Mah B Jenisa Monty, MD   300 mg at 12/07/15 0901  . LORazepam (ATIVAN) tablet 2 mg  2 mg Oral Q4H PRN Jimmy FootmanAndrea Hernandez-Gonzalez, MD      . magnesium hydroxide (MILK OF MAGNESIA) suspension 30 mL  30 mL Oral Daily PRN Audery AmelJohn T Clapacs, MD      . nicotine (NICODERM CQ - dosed in mg/24 hours) patch 21 mg  21 mg Transdermal Daily Jimmy FootmanAndrea Hernandez-Gonzalez, MD   21 mg at 12/07/15 [id number]  . QUEtiapine (SEROQUEL) tablet 200 mg  200 mg Oral QHS Jimmy FootmanAndrea Hernandez-Gonzalez, MD   200 mg at 12/06/15 2238    Lab Results:  No results found for this or any previous visit (from the past 48 hour(s)).  Blood Alcohol level:  Lab Results  Component Value Date   ETH <5 12/01/2015    Metabolic Disorder Labs: Lab Results  Component Value Date   HGBA1C 6.0 12/01/2015   No results found for: PROLACTIN Lab Results  Component  Value Date   CHOL 191 12/01/2015   TRIG 189 (H) 12/01/2015   HDL 46 12/01/2015   CHOLHDL 4.2 12/01/2015   VLDL 38 12/01/2015   LDLCALC 107 (H) 12/01/2015   LDLCALC 124 (H) 02/22/2015    Physical Findings: AIMS: Facial and Oral Movements Muscles of Facial Expression: None, normal Lips and Perioral Area: None, normal Jaw: None, normal Tongue: None, normal,Extremity Movements Upper (arms, wrists, hands, fingers): None, normal Lower (legs, knees, ankles, toes): None, normal, Trunk Movements Neck, shoulders, hips: None, normal, Overall Severity Severity of abnormal movements (highest score from questions above): None, normal Incapacitation due to abnormal movements: None, normal Patient's awareness of  abnormal movements (rate only patient's report): No Awareness, Dental Status Current problems with teeth and/or dentures?: No Does patient usually wear dentures?: No  CIWA:    COWS:     Musculoskeletal: Strength & Muscle Tone: within normal limits Gait & Station: normal Patient leans: N/A  Psychiatric Specialty Exam: Physical Exam  Nursing note and vitals reviewed. Constitutional: He is oriented to person, place, and time.  Neurological: He is oriented to person, place, and time.    Review of Systems  Psychiatric/Behavioral: Positive for hallucinations. Negative for depression, memory loss, substance abuse and suicidal ideas. The patient is not nervous/anxious and does not have insomnia.   All other systems reviewed and are negative.   Blood pressure (!) 132/93, pulse 94, temperature 97.8 F (36.6 C), temperature source Oral, resp. rate 20, height 5\' 6"  (1.676 m), weight 59.4 kg (131 lb), SpO2 100 %.Body mass index is 21.14 kg/m.  General Appearance: Fairly Groomed  Eye Contact:  Good  Speech:  Pressured  Volume:  Increased  Mood:  Irritable  Affect:  Congruent  Thought Process:  Disorganized  Orientation:  Full (Time, Place, and Person)  Thought Content:  Hallucinations: None  Suicidal Thoughts:  No  Homicidal Thoughts:  No  Memory:  Immediate;   Good Recent;   Good Remote;   Good  Judgement:  Impaired  Insight:  Shallow  Psychomotor Activity:  Increased  Concentration:  Concentration: Fair and Attention Span: Fair  Recall:  Good  Fund of Knowledge:  Good  Language:  Good  Akathisia:  No  Handed:    AIMS (if indicated):     Assets:  Communication Skills Social Support  ADL's:  Intact  Cognition:  WNL  Sleep:  Number of Hours: 5.5     Treatment Plan Summary:  Mr. Stief is a 70 year old male with history of bipolar illness admitted in a manic, psychotic episode in the context of medication noncompliance.  1. Bipolar disorder current episode manic. We will  increase Seroquel to 300 mg, lithium 450 mg at bedtime and Depakote 500 mg twice a day.  Lithium 0.54, VPA 58, ammonia 9. We will increase Lithium and Depakote dose.   2. Agitation and insomnia. We continue  Ativan 2 mg every 4 hours when necessary   3. Hypertension. Lisinopril was discontinued due to increased risk for lithium toxicity. W continue to monitor blood pressure.    4. Low TSH. Free T4 is normal.  5. Tobacco use disorder. Nicotine patch 21 mg a day is available.  6.  Metabolic syndrome monitoring. Lipid panel and hemoglobin A1care normal.  7. EKG. Normal EKG. QT 354.   8. Disposition. He will be discharged to home. He will follow-up with Dr. Lesle Chris.        Kristine Linea, MD 12/07/2015, 1:20 PM

## 2015-12-07 NOTE — Progress Notes (Signed)
Patient denies SI/AVH/HI.  Patient remains confused on different topics but is alert and oriented to when asked direct questions.  Patient able to respond to orientation questions appropriately but in general conversation makes confusing statements.  Patient attended groups and was interactive with peers and staff appropriately.  Patient was administered scheduled medications and patient was med compliant.  Patient safety maintained with 15 minute checks.

## 2015-12-07 NOTE — [facility] Group Notes (Signed)
[facility] LCSW Group Therapy  12/07/2015 12:25 PM  Type of Therapy:  Group Therapy  Participation Level:  Active  Participation Quality:  Intrusive and Sharing  Affect:  Blunted  Cognitive:  Lacking  Insight:  Distracting and Poor  Engagement in Therapy:  Lacking  Modes of Intervention:  Activity, Discussion, Education and Support  Summary of Progress/Problems: Balance in life: Patients will discuss the concept of balance and how it looks and feels to be unbalanced. Pt will identify areas in their life that is unbalanced and ways to become more balanced. Pt needed redirection to stay focused on the group topic. Patient was able to share that his independence is important to him. Pt needed redirection to respect peers while they were sharing with the group.    Shakala Marlatt G. Garnette CzechSampson MSW, LCSWA 12/07/2015, 12:28 PM

## 2015-12-08 LAB — BASIC METABOLIC PANEL
ANION GAP: 6 (ref 5–15)
BUN: 22 mg/dL — ABNORMAL HIGH (ref 6–20)
CALCIUM: 10.3 mg/dL (ref 8.9–10.3)
CHLORIDE: 107 mmol/L (ref 101–111)
CO2: 29 mmol/L (ref 22–32)
Creatinine, Ser: 1.41 mg/dL — ABNORMAL HIGH (ref 0.61–1.24)
GFR calc Af Amer: 58 mL/min — ABNORMAL LOW (ref >60)
GFR calc non Af Amer: 50 mL/min — ABNORMAL LOW (ref >60)
GLUCOSE: 92 mg/dL (ref 65–99)
POTASSIUM: 4.4 mmol/L (ref 3.5–5.1)
Sodium: 142 mmol/L (ref 135–145)

## 2015-12-08 LAB — VALPROIC ACID LEVEL: Valproic Acid Lvl: 91 ug/mL (ref 50.0–100.0)

## 2015-12-08 LAB — LITHIUM LEVEL: LITHIUM LVL: 0.61 mmol/L (ref 0.60–1.20)

## 2015-12-08 NOTE — [facility] Group Notes (Signed)
[facility] Group Notes:  (Nursing/MHT/Case Management/Adjunct)  Date:  12/08/2015  Time:  1:57 AM  Type of Therapy:  Group Therapy  Participation Level:  Minimal  Participation Quality:  Resistant  Affect:  Irritable  Cognitive:  Alert  Insight:  Lacking  Engagement in Group:  Lacking and Resistant  Modes of Intervention:  Discussion  Summary of Progress/Problems: When pt was asked about his goal. He replied to staff "why do you care? I want to get off my medication and stop smoking." Staff made pt aware that is important to take medication and we could provide him for information to quit. Pt became dismissive of staff and would not share anything else.   Fanny Skatesshley Imani Luz Mares 12/08/2015, 1:57 AM

## 2015-12-08 NOTE — Progress Notes (Signed)
D:  Patient denies SI/AVH/HI.  Patient pleasant and cooperative with peers and staff.  Patient continues to be suspicious about medications.   A:  Patient encouraged to attend groups.  Patient administered scheduled medications.   R:  Patient attended groups and was an active participant in group activities.  Patient safety maintained with 15 minute checks.

## 2015-12-08 NOTE — Progress Notes (Signed)
D: Observed pt in dayroom interacting with peers. Patient alert and oriented x4. Patient denies SI/HI/AVH. Pt affect is irritable, labile and preoccupied. Pt is pleasant on approach, but when assessment questions are asked pt becomes very defensive and demanding. Pt very preoccupied with wanting to stop taking his lithium and lowering the dosages of his other medications. Pt stated his mood is "Calm, now I told you to stop asking me questions." Pt is very short with Clinical research associatewriter during conversation. Pt talked about being a veteran, but stated the information was "classified." Pt woke in the night upset that staff was going into his room during rounds and stated "I'll write you up."  A: Offered active listening and support. Provided therapeutic communication. Administered scheduled medications. Attempted to educate pt on medication regimen. Explained the importance of safety and 15 minute rounds. R: Pt cooperative. Pt medication compliant. Will continue Q15 min. checks. Safety maintained.

## 2015-12-08 NOTE — [facility] Group Notes (Signed)
[facility] LCSW Group Therapy  12/08/2015 1:54 PM  Type of Therapy:  Group Therapy  Participation Level:  Active  Participation Quality:  Intrusive and Sharing  Affect:  Blunted and Not Congruent  Cognitive:  Alert  Insight:  Lacking  Engagement in Therapy:  Distracting and Limited  Modes of Intervention:  Activity, Discussion, Education and Support  Summary of Progress/Problems:Feelings around Relapse. Group members discussed the meaning of relapse and shared personal stories of relapse, how it affected them and others, and how they perceived themselves during this time. Group members were encouraged to identify triggers, warning signs and coping skills used when facing the possibility of relapse. Social supports were discussed and explored in detail. Patients also discussed facing disappointment and how that can trigger someone to relapse. Patient shared with the group that he has anger issues. Patient stated "Taking medications help a lot". Patient needed some redirection for interrupting peers. Patient also would make bizarre/random comments in the group discussion about the government. When asked to elaborate further on his comments patient would respond, "It's over your head, yall are too stupid to get it".    Oralee Rapaport G. Garnette CzechSampson MSW, LCSWA 12/08/2015, 1:59 PM

## 2015-12-08 NOTE — [facility] Group Notes (Signed)
[facility] Group Notes:  (Nursing/MHT/Case Management/Adjunct)  Date:  12/08/2015  Time:  6:06 PM  Type of Therapy:  Psychoeducational Skills  Participation Level:  Active  Participation Quality:  Appropriate  Affect:  Appropriate  Cognitive:  Appropriate  Insight:  Appropriate  Engagement in Group:  Engaged  Modes of Intervention:  Socialization  Summary of Progress/Problems:  Lynelle SmokeCara Travis Kendalyn Cranfield 12/08/2015, 6:06 PM

## 2015-12-08 NOTE — Plan of Care (Signed)
Problem: Education: Goal: Knowledge of the prescribed therapeutic regimen will improve Outcome: Progressing Pt has knowledge of medication regimen, but constantly challenges the efficacy of his prescriptions.

## 2015-12-08 NOTE — Progress Notes (Signed)
[facility] MD Progress Note  12/08/2015 12:11 PM Tony Fowler  MRN:  [id number]  Subjective: Tony Fowler is a 70 year old male-year-old bipolar disorder admitted in a manic, psychotic episode. His lithium was recently discontinued due to decreasing kidney function. The patient was restarted on lithium in the hospital but I discontinued it today as his BUN and Cr increased. The patient no longer wants to take Lithium anyway. He is very disorganized and delusional. He believes that he is in a rehabilitation center where she receives treatment for nicotine addiction. He is on Depakote and Seroquel. He holds his increase in Seroquel dose. We may need to consider an addition of another mood stabilizer. His family member responded well to Tegretol.   Today the patient remains irritable, intrusive and litigious. His thinking is disorganized, his speech in nonsensical. He accepts medications with much encouragement only.   Principal Problem: Bipolar affective disorder, manic, severe, with psychotic behavior ([place]) Diagnosis:   Patient Active Problem List   Diagnosis Date Noted  . Bipolar affective disorder, manic, severe, with psychotic behavior ([place]) [F31.2] 12/01/2015  . Involuntary commitment [Z04.6] 12/01/2015  . Tobacco use disorder [F17.200] 12/01/2015  . BPH (benign prostatic hyperplasia) [N40.0] 02/22/2015  . Benign hypertension [I10] 09/07/2014  . CKD (chronic kidney disease), stage III [N18.3] 09/07/2014   Total Time spent with patient: 30 minutes  Past Psychiatric History:   Past Medical History:  Past Medical History:  Diagnosis Date  . Bipolar 1 disorder [facility])     Past Surgical History:  Procedure Laterality Date  . TONSILECTOMY/ADENOIDECTOMY WITH MYRINGOTOMY     Family History:  Family History  Problem Relation Age of Onset  . Heart disease Mother   . Hypertension Mother   . Mental illness Mother   . Heart disease Brother   . Hypertension Brother    Family Psychiatric  History:   Social History:  History  Alcohol Use No     History  Drug Use No    Social History   Social History  . Marital status: Divorced    Spouse name: N/A  . Number of children: N/A  . Years of education: N/A   Social History Main Topics  . Smoking status: Current Every Day Smoker    Packs/day: 0.75    Years: 0.00    Types: Cigarettes  . Smokeless tobacco: Never Used  . Alcohol use No  . Drug use: No  . Sexual activity: Not Asked   Other Topics Concern  . None   Social History Narrative  . None     Current Medications: Current Facility-Administered Medications  Medication Dose Route Frequency Provider Last Rate Last Dose  . acetaminophen (TYLENOL) tablet 650 mg  650 mg Oral Q6H PRN Gonzella Lex, MD      . alum & mag hydroxide-simeth (MAALOX/MYLANTA) 200-200-20 MG/5ML suspension 30 mL  30 mL Oral Q4H PRN Gonzella Lex, MD      . divalproex (DEPAKOTE) DR tablet 500 mg  500 mg Oral Q8H Johnna Bollier B Deana Krock, MD   500 mg at 12/08/15 0659  . LORazepam (ATIVAN) tablet 2 mg  2 mg Oral Q4H PRN Hildred Priest, MD      . magnesium hydroxide (MILK OF MAGNESIA) suspension 30 mL  30 mL Oral Daily PRN Gonzella Lex, MD      . nicotine (NICODERM CQ - dosed in mg/24 hours) patch 21 mg  21 mg Transdermal Daily Hildred Priest, MD   21 mg at 12/08/15 0837  .  QUEtiapine (SEROQUEL) tablet 300 mg  300 mg Oral QHS Clovis Fredrickson, MD   300 mg at 12/07/15 2157    Lab Results:  Results for orders placed or performed during the hospital encounter of 12/01/15 (from the past 48 hour(s))  Lithium level     Status: None   Collection Time: 12/08/15  6:50 AM  Result Value Ref Range   Lithium Lvl 0.61 0.60 - 1.20 mmol/L  Valproic acid level     Status: None   Collection Time: 12/08/15  6:50 AM  Result Value Ref Range   Valproic Acid Lvl 91 50.0 - 100.0 ug/mL  Basic metabolic panel     Status: Abnormal   Collection Time: 12/08/15  6:50 AM  Result Value Ref Range    Sodium 142 135 - 145 mmol/L   Potassium 4.4 3.5 - 5.1 mmol/L   Chloride 107 101 - 111 mmol/L   CO2 29 22 - 32 mmol/L   Glucose, Bld 92 65 - 99 mg/dL   BUN 22 (H) 6 - 20 mg/dL   Creatinine, Ser 1.41 (H) 0.61 - 1.24 mg/dL   Calcium 10.3 8.9 - 10.3 mg/dL   GFR calc non Af Amer 50 (L) >60 mL/min   GFR calc Af Amer 58 (L) >60 mL/min    Comment: (NOTE) The eGFR has been calculated using the CKD EPI equation. This calculation has not been validated in all clinical situations. eGFR's persistently <60 mL/min signify possible Chronic Kidney Disease.    Anion gap 6 5 - 15    Blood Alcohol level:  Lab Results  Component Value Date   ETH <5 12/87/8676    Metabolic Disorder Labs: Lab Results  Component Value Date   HGBA1C 6.0 12/01/2015   No results found for: PROLACTIN Lab Results  Component Value Date   CHOL 191 12/01/2015   TRIG 189 (H) 12/01/2015   HDL 46 12/01/2015   CHOLHDL 4.2 12/01/2015   VLDL 38 12/01/2015   LDLCALC 107 (H) 12/01/2015   LDLCALC 124 (H) 02/22/2015    Physical Findings: AIMS: Facial and Oral Movements Muscles of Facial Expression: None, normal Lips and Perioral Area: None, normal Jaw: None, normal Tongue: None, normal,Extremity Movements Upper (arms, wrists, hands, fingers): None, normal Lower (legs, knees, ankles, toes): None, normal, Trunk Movements Neck, shoulders, hips: None, normal, Overall Severity Severity of abnormal movements (highest score from questions above): None, normal Incapacitation due to abnormal movements: None, normal Patient's awareness of abnormal movements (rate only patient's report): No Awareness, Dental Status Current problems with teeth and/or dentures?: No Does patient usually wear dentures?: No  CIWA:    COWS:     Musculoskeletal: Strength & Muscle Tone: within normal limits Gait & Station: normal Patient leans: N/A  Psychiatric Specialty Exam: Physical Exam  Nursing note and vitals reviewed. Constitutional: He  is oriented to person, place, and time.  Neurological: He is oriented to person, place, and time.    Review of Systems  Neurological: Positive for tremors.  Psychiatric/Behavioral: Positive for hallucinations. Negative for depression, memory loss, substance abuse and suicidal ideas. The patient is not nervous/anxious and does not have insomnia.   All other systems reviewed and are negative.   Blood pressure (!) 143/78, pulse 95, temperature 99.3 F (37.4 C), resp. rate 20, height 5' 6"  (1.676 m), weight 59.4 kg (131 lb), SpO2 100 %.Body mass index is 21.14 kg/m.  General Appearance: Fairly Groomed  Eye Contact:  Good  Speech:  Pressured  Volume:  Increased  Mood:  Irritable  Affect:  Congruent  Thought Process:  Disorganized  Orientation:  Full (Time, Place, and Person)  Thought Content:  Hallucinations: None  Suicidal Thoughts:  No  Homicidal Thoughts:  No  Memory:  Immediate;   Good Recent;   Good Remote;   Good  Judgement:  Impaired  Insight:  Shallow  Psychomotor Activity:  Increased  Concentration:  Concentration: Fair and Attention Span: Fair  Recall:  Good  Fund of Knowledge:  Good  Language:  Good  Akathisia:  No  Handed:    AIMS (if indicated):     Assets:  Communication Skills Social Support  ADL's:  Intact  Cognition:  WNL  Sleep:  Number of Hours: 5.5     Treatment Plan Summary:  Tony Fowler is a 70 year old male with history of bipolar illness admitted in a manic, psychotic episode in the context of medication noncompliance.  1. Bipolar disorder current episode manic. We increased Seroquel to 300 mg nightly, discontinued Lithium due to CKD and continued Depakote 500 mg tid. VPA 91.    2. Agitation and insomnia. We continue  Ativan 2 mg every 4 hours when necessary.   3. Hypertension. Lisinopril was discontinued due to increased risk for lithium toxicity. We continue to monitor blood pressure.    4. Low TSH. Free T4 is normal.  5. Tobacco use  disorder. Nicotine patch 21 mg a day is available.  6.  Metabolic syndrome monitoring. Lipid panel and hemoglobin A1care normal.  7. EKG. Normal EKG. QT 354.   8. Disposition. He will be discharged to home. He will follow-up with Dr. Eduard Clos.        Orson Slick, MD 12/08/2015, 12:11 PM

## 2015-12-08 NOTE — Plan of Care (Signed)
Problem: Safety: Goal: Periods of time without injury will increase Outcome: Progressing Patient has remained free from injury this shift.  Patient safety maintained with 15 minute checks.   

## 2015-12-09 NOTE — Progress Notes (Signed)
D: Pt denies SI/HI/AVH. Pt is pleasant and cooperative. Pt appears less anxious and he is interacting with peers and staff appropriately.  A: Pt was offered support and encouragement. Pt was given scheduled medications. Pt was encouraged to attend groups. Q 15 minute checks were done for safety.  R:Pt attends groups and interacts well with peers and staff. Pt is taking medication. Pt has no complaints.Pt receptive to treatment and safety maintained on unit.   

## 2015-12-09 NOTE — [facility] Group Notes (Signed)
[facility] LCSW Group Therapy  12/09/2015 4:36 PM  Type of Therapy:  Group Therapy  Participation Level:  Active  Participation Quality:  Attentive, Intrusive, Redirectable and Sharing  Affect:  Blunted and Excited  Cognitive:  Alert and Disorganized  Insight:  Limited  Engagement in Therapy:  Engaged  Modes of Intervention:  Activity, Clarification, Discussion, Problem-solving and Support  Summary of Progress/Problems: Protective Factors: Patients defined protective factors and discussed the importance recognizing their personal strengths. Patients identified their own protective factors and resiliency. Patients discussed building upon those protective factors to improve their ability to cope with life challenges. Patient needed several redirection prompts for interrupting peers and would make random comments about the government. Patient stated he needs to improve communication with his daughters but doesn't feel he can if they don't continue to give him a pack of cigarettes a day. Patient stated "My family is scared of me, and they should be if they don't give me cigarettes".     Cire Clute G. Garnette CzechSampson MSW, LCSWA 12/09/2015, 4:40 PM

## 2015-12-09 NOTE — Progress Notes (Signed)
Pt has been pleasant and cooperative this period. Pt's mood and affect has been  depressed but brightens when verbally  engaged . Pt has been active on the unit, attending all unit activities. Pt denies SI,HI and A/V hallucinations . Will continue to observe and maintain a safe environment..Marland Kitchen

## 2015-12-09 NOTE — [facility] Group Notes (Signed)
[facility] Group Notes:  (Nursing/MHT/Case Management/Adjunct)  Date:  12/09/2015  Time:  5:37 AM  Type of Therapy:  Psychoeducational Skills  Participation Level:  Active  Participation Quality:  Appropriate and Sharing  Affect:  Appropriate  Cognitive:  Appropriate  Insight:  Appropriate  Engagement in Group:  Engaged  Modes of Intervention:  Discussion, Socialization and Support  Summary of Progress/Problems:  Chancy MilroyLaquanda Y Avir Deruiter 12/09/2015, 5:37 AM

## 2015-12-09 NOTE — Progress Notes (Signed)
[facility] MD Progress Note  12/09/2015 3:11 PM Tony Fowler  MRN:  [id number]  Subjective: Tony Fowler is a 70 year old male-year-old bipolar disorder admitted in a manic, psychotic episode. His lithium was recently discontinued due to decreasing kidney function. The patient was restarted on lithium in the hospital but I discontinued it today as his BUN and Cr increased.  The patient appeared to have more organized thought processes today. He was talking about being in the hospital in order to get off of lithium. He did still have some mild paranoid thoughts about "[place] doctors" and said he only trusted [place] doctors. He has appeared less anxious staff overall. He denies any current active or passive suicidal thoughts or psychotic symptoms and ongoing auditory or visualizations. He however does appear to have some mild paranoid thoughts. He has been attending groups and has been compliant with medications. He slept over 7 hours last night and appetite is fairly good. Vital signs have been stable. Creatinine was 1.41 today. He denies any somatic complaints.  Substance abuse history: The patient denies any history of any substance use  Trauma: Use will need to be investigated further as this was not addressed during this assessment. He had reported in the past having PTSD from his time in the [employer]  Prior to admission the patient reported taking Seroquel 100 mg and lithium however he stopped the lithium on May 8 in because his creatinine was abnormally elevated.  Past Psychiatric History: Patient says he has been seeing Dr. Zollie Scale. Says that he has an appointment in October. He has been hospitalized at least 5 times in the past for what appears to be mania. He denies any history suicidal attempts or self-injurious behaviors.    Principal Problem: Bipolar affective disorder, manic, severe, with psychotic behavior ([place]) Diagnosis:   Patient Active Problem List   Diagnosis Date Noted  .  Bipolar affective disorder, manic, severe, with psychotic behavior ([place]) [F31.2] 12/01/2015  . Involuntary commitment [Z04.6] 12/01/2015  . Tobacco use disorder [F17.200] 12/01/2015  . BPH (benign prostatic hyperplasia) [N40.0] 02/22/2015  . Benign hypertension [I10] 09/07/2014  . CKD (chronic kidney disease), stage III [N18.3] 09/07/2014   Total Time spent with patient: 30 minutes    Past Medical History:  Past Medical History:  Diagnosis Date  . Bipolar 1 disorder [facility])     Past Surgical History:  Procedure Laterality Date  . TONSILECTOMY/ADENOIDECTOMY WITH MYRINGOTOMY     Family History:  Family History  Problem Relation Age of Onset  . Heart disease Mother   . Hypertension Mother   . Mental illness Mother   . Heart disease Brother   . Hypertension Brother    Family Psychiatric  History:  Social History:  History  Alcohol Use No     History  Drug Use No    Social History   Social History  . Marital status: Divorced    Spouse name: N/A  . Number of children: N/A  . Years of education: N/A   Social History Main Topics  . Smoking status: Current Every Day Smoker    Packs/day: 0.75    Years: 0.00    Types: Cigarettes  . Smokeless tobacco: Never Used  . Alcohol use No  . Drug use: No  . Sexual activity: Not Asked   Other Topics Concern  . None   Social History Narrative  . None     Current Medications: Current Facility-Administered Medications  Medication Dose Route Frequency Provider Last Rate Last Dose  .  acetaminophen (TYLENOL) tablet 650 mg  650 mg Oral Q6H PRN Gonzella Lex, MD      . alum & mag hydroxide-simeth (MAALOX/MYLANTA) 200-200-20 MG/5ML suspension 30 mL  30 mL Oral Q4H PRN Gonzella Lex, MD      . divalproex (DEPAKOTE) DR tablet 500 mg  500 mg Oral Q8H Jolanta B Pucilowska, MD   500 mg at 12/09/15 1433  . LORazepam (ATIVAN) tablet 2 mg  2 mg Oral Q4H PRN Hildred Priest, MD      . magnesium hydroxide (MILK OF MAGNESIA)  suspension 30 mL  30 mL Oral Daily PRN Gonzella Lex, MD      . nicotine (NICODERM CQ - dosed in mg/24 hours) patch 21 mg  21 mg Transdermal Daily Hildred Priest, MD   21 mg at 12/09/15 0834  . QUEtiapine (SEROQUEL) tablet 300 mg  300 mg Oral QHS Clovis Fredrickson, MD   300 mg at 12/08/15 2125    Lab Results:  Results for orders placed or performed during the hospital encounter of 12/01/15 (from the past 48 hour(s))  Lithium level     Status: None   Collection Time: 12/08/15  6:50 AM  Result Value Ref Range   Lithium Lvl 0.61 0.60 - 1.20 mmol/L  Valproic acid level     Status: None   Collection Time: 12/08/15  6:50 AM  Result Value Ref Range   Valproic Acid Lvl 91 50.0 - 100.0 ug/mL  Basic metabolic panel     Status: Abnormal   Collection Time: 12/08/15  6:50 AM  Result Value Ref Range   Sodium 142 135 - 145 mmol/L   Potassium 4.4 3.5 - 5.1 mmol/L   Chloride 107 101 - 111 mmol/L   CO2 29 22 - 32 mmol/L   Glucose, Bld 92 65 - 99 mg/dL   BUN 22 (H) 6 - 20 mg/dL   Creatinine, Ser 1.41 (H) 0.61 - 1.24 mg/dL   Calcium 10.3 8.9 - 10.3 mg/dL   GFR calc non Af Amer 50 (L) >60 mL/min   GFR calc Af Amer 58 (L) >60 mL/min    Comment: (NOTE) The eGFR has been calculated using the CKD EPI equation. This calculation has not been validated in all clinical situations. eGFR's persistently <60 mL/min signify possible Chronic Kidney Disease.    Anion gap 6 5 - 15    Blood Alcohol level:  Lab Results  Component Value Date   ETH <5 50/38/8828    Metabolic Disorder Labs: Lab Results  Component Value Date   HGBA1C 6.0 12/01/2015   No results found for: PROLACTIN Lab Results  Component Value Date   CHOL 191 12/01/2015   TRIG 189 (H) 12/01/2015   HDL 46 12/01/2015   CHOLHDL 4.2 12/01/2015   VLDL 38 12/01/2015   LDLCALC 107 (H) 12/01/2015   LDLCALC 124 (H) 02/22/2015    Physical Findings: AIMS: Facial and Oral Movements Muscles of Facial Expression: None,  normal Lips and Perioral Area: None, normal Jaw: None, normal Tongue: None, normal,Extremity Movements Upper (arms, wrists, hands, fingers): None, normal Lower (legs, knees, ankles, toes): None, normal, Trunk Movements Neck, shoulders, hips: None, normal, Overall Severity Severity of abnormal movements (highest score from questions above): None, normal Incapacitation due to abnormal movements: None, normal Patient's awareness of abnormal movements (rate only patient's report): No Awareness, Dental Status Current problems with teeth and/or dentures?: No Does patient usually wear dentures?: No  CIWA:    COWS:  Musculoskeletal: Strength & Muscle Tone: within normal limits Gait & Station: normal Patient leans: N/A  Psychiatric Specialty Exam: Physical Exam  Nursing note and vitals reviewed. Constitutional: He is oriented to person, place, and time.  Neurological: He is oriented to person, place, and time.    Review of Systems  Constitutional: Positive for weight loss. Negative for chills, fever and malaise/fatigue.  HENT: Negative.  Negative for congestion, ear pain, hearing loss and sore throat.   Eyes: Negative.  Negative for blurred vision, double vision and photophobia.  Respiratory: Negative.  Negative for cough, hemoptysis, sputum production, shortness of breath and wheezing.   Cardiovascular: Negative.  Negative for chest pain, palpitations, claudication and PND.  Gastrointestinal: Negative.  Negative for abdominal pain, constipation, diarrhea, heartburn, nausea and vomiting.  Genitourinary: Negative.   Musculoskeletal: Negative.   Skin: Negative.  Negative for itching and rash.  Neurological: Positive for tremors. Negative for dizziness, tingling, sensory change, focal weakness, seizures, loss of consciousness, weakness and headaches.  Endo/Heme/Allergies: Negative.   Psychiatric/Behavioral: Positive for depression. Negative for hallucinations, memory loss, substance  abuse and suicidal ideas. The patient is nervous/anxious. The patient does not have insomnia.   All other systems reviewed and are negative.   Blood pressure 139/84, pulse 77, temperature 97.5 F (36.4 C), resp. rate 20, height 5' 6"  (1.676 m), weight 59.4 kg (131 lb), SpO2 100 %.Body mass index is 21.14 kg/m.  General Appearance: Fairly Groomed  Eye Contact:  Good  Speech:  Mildly pressured  Volume:  Normal  Mood:  Irritable  Affect:  Congruent  Thought Process:  More Organized Today  Orientation:  Full (Time, Place, and Person)  Thought Content:  Hallucinations: None  Suicidal Thoughts:  No  Homicidal Thoughts:  No  Memory:  Immediate;   Good Recent;   Good Remote;   Good  Judgement:  Impaired  Insight:  Shallow  Psychomotor Activity:  Increased  Concentration:  Concentration: Fair and Attention Span: Fair  Recall:  Good  Fund of Knowledge:  Good  Language:  Good  Akathisia:  No  Handed:    AIMS (if indicated):     Assets:  Communication Skills Social Support  ADL's:  Intact  Cognition:  WNL  Sleep:  Number of Hours: 7.5     Treatment Plan Summary:  Tony Fowler is a 70 year old male with history of bipolar illness admitted in a manic, psychotic episode in the context of medication noncompliance.  1. Bipolar disorder current episode manic. We increased Seroquel to 300 mg nightly for mood stabilzation.  Discontinued Lithium due to CKD and will monitor BUN/Cr. Will continue Depakote 500 mg tid. VPA 91.    2. Agitation and insomnia. We continue  Ativan 2 mg every 4 hours when necessary.   3. Hypertension. Lisinopril was discontinued due to increased risk for lithium toxicity. We continue to monitor blood pressure.  VSS  4. Low TSH. Free T4 is normal.  5. Tobacco use disorder. Nicotine patch 21 mg a day is available.  6.  Metabolic syndrome monitoring. Lipid panel and hemoglobin A1care normal.  7. EKG. Normal EKG. QT 354.   8. Disposition. He will be  discharged to home. He will follow-up with Dr. Eduard Clos.        Jay Schlichter, MD 12/09/2015, 3:11 PM

## 2015-12-10 LAB — BASIC METABOLIC PANEL
Anion gap: 5 (ref 5–15)
BUN: 22 mg/dL — AB (ref 6–20)
CHLORIDE: 109 mmol/L (ref 101–111)
CO2: 25 mmol/L (ref 22–32)
CREATININE: 1.19 mg/dL (ref 0.61–1.24)
Calcium: 9.8 mg/dL (ref 8.9–10.3)
GFR calc Af Amer: >60 mL/min (ref >60)
GFR calc non Af Amer: >60 mL/min (ref >60)
GLUCOSE: 107 mg/dL — AB (ref 65–99)
POTASSIUM: 4.9 mmol/L (ref 3.5–5.1)
SODIUM: 139 mmol/L (ref 135–145)

## 2015-12-10 LAB — HEPATIC FUNCTION PANEL
ALT: 13 U/L — AB (ref 17–63)
AST: 18 U/L (ref 15–41)
Albumin: 3.8 g/dL (ref 3.5–5.0)
Alkaline Phosphatase: 54 U/L (ref 38–126)
BILIRUBIN TOTAL: 0.3 mg/dL (ref 0.3–1.2)
Total Protein: 6.9 g/dL (ref 6.5–8.1)

## 2015-12-10 LAB — VALPROIC ACID LEVEL: VALPROIC ACID LVL: 103 ug/mL — AB (ref 50.0–100.0)

## 2015-12-10 LAB — AMMONIA: Ammonia: 18 umol/L (ref 9–35)

## 2015-12-10 MED ORDER — DIVALPROEX SODIUM 500 MG PO DR TAB
500.0000 mg | DELAYED_RELEASE_TABLET | Freq: Two times a day (BID) | ORAL | Status: DC
  Administered 2015-12-11 – 2015-12-14 (×7): 500 mg via ORAL
  Filled 2015-12-10 (×7): qty 1

## 2015-12-10 MED ORDER — DIVALPROEX SODIUM 500 MG PO DR TAB: 500.0000 mg | DELAYED_RELEASE_TABLET | Freq: Two times a day (BID) | ORAL | Status: DC

## 2015-12-10 NOTE — Plan of Care (Signed)
Problem: Coping: Goal: Ability to verbalize feelings will improve Outcome: Not Progressing Patient not verbalizing frustrations continues to be tangential in thought process [place] RN

## 2015-12-10 NOTE — [facility] Group Notes (Signed)
[facility] LCSW Group Therapy  12/10/2015 2:47 PM  Type of Therapy:  Group Therapy  Participation Level:  Patient did not attend group. CSW invited patient to group.   Summary of Progress/Problems:Coping Skills: Patients defined and discussed healthy coping skills. Patients identified healthy coping skills they would like to try during hospitalization and after discharge. CSW offered insight to varying coping skills that may have been new to patients such as practicing mindfulness.  Goldia Ligman G. Garnette CzechSampson MSW, LCSWA 12/10/2015, 2:47 PM

## 2015-12-10 NOTE — [facility] Group Notes (Signed)
[facility] Group Notes:  (Nursing/MHT/Case Management/Adjunct)  Date:  12/10/2015  Time:  5:19 AM  Type of Therapy:  Psychoeducational Skills  Participation Level:  Active  Participation Quality:  Appropriate, Attentive and Sharing  Affect:  Appropriate  Cognitive:  Appropriate  Insight:  Appropriate  Engagement in Group:  Engaged  Modes of Intervention:  Discussion, Socialization and Support  Summary of Progress/Problems:  Tony MilroyLaquanda Y Anthonia Fowler 12/10/2015, 5:19 AM

## 2015-12-10 NOTE — Progress Notes (Signed)
[facility] MD Progress Note  12/10/2015 12:32 PM Tony Fowler  MRN:  [id number]  Subjective: [name] is a 70 year old male-year-old bipolar disorder admitted in a manic, psychotic episode. His lithium was recently discontinued due to decreasing kidney function. The patient was restarted on lithium in the hospital but I discontinued it today as his BUN and Cr increased.  The patient was mildly lethargic today and speech was slurred. Last valproic acid level was 91 on September 1. He continues to make random comments about the government and various religions. He did attend some groups yesterday and has been the day room interacting fairly well with staff and peers. He is very frustrated with his medication and says the Ativan is causing him to have slurred speech. He does not want to take any more Ativan. He denies any current active or passive suicidal thoughts or hallucinations but thought processes are disorganized. He does have some mild generalized paranoid thoughts about the medication and staff. The patient denies any problems with his appetite. Vital signs are stable. He slept 8 hours last night per nursing. No behavioral disturbances.   Substance abuse history: The patient denies any history of any substance use  Trauma: Use will need to be investigated further as this was not addressed during this assessment. He had reported in the past having PTSD from his time in the [employer].  Past Psychiatric History: Patient says he has been seeing Dr. Zollie Scale. Says that he has an appointment in October. He has been hospitalized at least 5 times in the past for what appears to be mania. He denies any history suicidal attempts or self-injurious behaviors.    Principal Problem: Bipolar affective disorder, manic, severe, with psychotic behavior ([place]) Diagnosis:   Patient Active Problem List   Diagnosis Date Noted  . Bipolar affective disorder, manic, severe, with psychotic behavior ([place]) [F31.2]  12/01/2015  . Involuntary commitment [Z04.6] 12/01/2015  . Tobacco use disorder [F17.200] 12/01/2015  . BPH (benign prostatic hyperplasia) [N40.0] 02/22/2015  . Benign hypertension [I10] 09/07/2014  . CKD (chronic kidney disease), stage III [N18.3] 09/07/2014   Total Time spent with patient: 30 minutes    Past Medical History:  Past Medical History:  Diagnosis Date  . Bipolar 1 disorder [facility])     Past Surgical History:  Procedure Laterality Date  . TONSILECTOMY/ADENOIDECTOMY WITH MYRINGOTOMY     Family History:  Family History  Problem Relation Age of Onset  . Heart disease Mother   . Hypertension Mother   . Mental illness Mother   . Heart disease Brother   . Hypertension Brother     Social History:  History  Alcohol Use No     History  Drug Use No    Social History   Social History  . Marital status: Divorced    Spouse name: N/A  . Number of children: N/A  . Years of education: N/A   Social History Main Topics  . Smoking status: Current Every Day Smoker    Packs/day: 0.75    Years: 0.00    Types: Cigarettes  . Smokeless tobacco: Never Used  . Alcohol use No  . Drug use: No  . Sexual activity: Not Asked   Other Topics Concern  . None   Social History Narrative  . None     Current Medications: Current Facility-Administered Medications  Medication Dose Route Frequency Provider Last Rate Last Dose  . acetaminophen (TYLENOL) tablet 650 mg  650 mg Oral Q6H PRN Gonzella Lex, MD      .  alum & mag hydroxide-simeth (MAALOX/MYLANTA) 200-200-20 MG/5ML suspension 30 mL  30 mL Oral Q4H PRN Gonzella Lex, MD      . divalproex (DEPAKOTE) DR tablet 500 mg  500 mg Oral BID Chauncey Mann, MD      . magnesium hydroxide (MILK OF MAGNESIA) suspension 30 mL  30 mL Oral Daily PRN Gonzella Lex, MD      . nicotine (NICODERM CQ - dosed in mg/24 hours) patch 21 mg  21 mg Transdermal Daily Hildred Priest, MD   21 mg at 12/10/15 0845  . QUEtiapine (SEROQUEL)  tablet 300 mg  300 mg Oral QHS Clovis Fredrickson, MD   300 mg at 12/09/15 2202    Lab Results:  Results for orders placed or performed during the hospital encounter of 12/01/15 (from the past 48 hour(s))  Basic metabolic panel     Status: Abnormal   Collection Time: 12/10/15  6:48 AM  Result Value Ref Range   Sodium 139 135 - 145 mmol/L   Potassium 4.9 3.5 - 5.1 mmol/L   Chloride 109 101 - 111 mmol/L   CO2 25 22 - 32 mmol/L   Glucose, Bld 107 (H) 65 - 99 mg/dL   BUN 22 (H) 6 - 20 mg/dL   Creatinine, Ser 1.19 0.61 - 1.24 mg/dL   Calcium 9.8 8.9 - 10.3 mg/dL   GFR calc non Af Amer >60 >60 mL/min   GFR calc Af Amer >60 >60 mL/min    Comment: (NOTE) The eGFR has been calculated using the CKD EPI equation. This calculation has not been validated in all clinical situations. eGFR's persistently <60 mL/min signify possible Chronic Kidney Disease.    Anion gap 5 5 - 15    Blood Alcohol level:  Lab Results  Component Value Date   ETH <5 16/01/9603    Metabolic Disorder Labs: Lab Results  Component Value Date   HGBA1C 6.0 12/01/2015   No results found for: PROLACTIN Lab Results  Component Value Date   CHOL 191 12/01/2015   TRIG 189 (H) 12/01/2015   HDL 46 12/01/2015   CHOLHDL 4.2 12/01/2015   VLDL 38 12/01/2015   LDLCALC 107 (H) 12/01/2015   LDLCALC 124 (H) 02/22/2015    Physical Findings: AIMS: Facial and Oral Movements Muscles of Facial Expression: None, normal Lips and Perioral Area: None, normal Jaw: None, normal Tongue: None, normal,Extremity Movements Upper (arms, wrists, hands, fingers): None, normal Lower (legs, knees, ankles, toes): None, normal, Trunk Movements Neck, shoulders, hips: None, normal, Overall Severity Severity of abnormal movements (highest score from questions above): None, normal Incapacitation due to abnormal movements: None, normal Patient's awareness of abnormal movements (rate only patient's report): No Awareness, Dental  Status Current problems with teeth and/or dentures?: No Does patient usually wear dentures?: No  CIWA:    COWS:     Musculoskeletal: Strength & Muscle Tone: within normal limits Gait & Station: normal Patient leans: N/A  Psychiatric Specialty Exam: Physical Exam  Nursing note and vitals reviewed. Constitutional: He is oriented to person, place, and time.  Neurological: He is oriented to person, place, and time.    Review of Systems  Constitutional: Positive for weight loss. Negative for chills, fever and malaise/fatigue.  HENT: Negative.  Negative for congestion, ear pain, hearing loss and sore throat.   Eyes: Negative.  Negative for blurred vision, double vision and photophobia.  Respiratory: Negative.  Negative for cough, hemoptysis, sputum production, shortness of breath and wheezing.   Cardiovascular: Negative.  Negative for chest pain, palpitations, claudication and PND.  Gastrointestinal: Negative.  Negative for abdominal pain, constipation, diarrhea, heartburn, nausea and vomiting.  Genitourinary: Negative.  Negative for dysuria, frequency and urgency.  Musculoskeletal: Negative.   Skin: Negative.  Negative for itching and rash.  Neurological: Positive for tremors and speech change. Negative for dizziness, tingling, sensory change, focal weakness, seizures, loss of consciousness, weakness and headaches.  Endo/Heme/Allergies: Negative.   Psychiatric/Behavioral: Positive for depression. Negative for hallucinations, memory loss, substance abuse and suicidal ideas. The patient is nervous/anxious. The patient does not have insomnia.   All other systems reviewed and are negative.   Blood pressure 126/80, pulse 80, temperature 97.9 F (36.6 C), temperature source Oral, resp. rate 18, height _0  (1.676 m), weight 59.4 kg (131 lb), SpO2 100 %.Body mass index is 21.14 kg/m.  General Appearance: Fairly Groomed  Eye Contact:  Fair  Speech:  Slow and Slurred  Volume:  Decreased   Mood:  Dysphoric  Affect:  Blunt  Thought Process:  More Organized Today  Orientation:  Full (Time, Place, and Person)  Thought Content:  Hallucinations: None  Suicidal Thoughts:  No  Homicidal Thoughts:  No  Memory:  Immediate;   Fair Recent;   Fair Remote;   Fair  Judgement:  Impaired  Insight:  Shallow  Psychomotor Activity:  Decreased  Concentration:  Concentration: Fair and Attention Span: Fair  Recall:  [employer] of Knowledge:  Good  Language:  Good  Akathisia:  No  Handed:    AIMS (if indicated):     Assets:  Communication Skills Social Support  ADL's:  Intact  Cognition:  WNL  Sleep:  Number of Hours: 6.15     Treatment Plan Summary:  Mr. Gorniak is a 70 year old male with history of bipolar illness admitted in a manic, psychotic episode in the context of medication noncompliance.  1. Bipolar disorder current episode manic: The patient does have slurred speech today and will need to check valproic acid level, LFTs stat. Will decrease Depakote for now to 536m by mouth twice a day versus 3 times a day. Last valproic acid level was 91 on September 1. Lithium was discontinued secondary to chronic kidney disease. BUN and creatinine were within normal limits today. Secondary to slurred speech and mild lethargy, will discontinue Ativan for now. The patient is also very opposed to taking Ativan. He does have Seroquel 300 mg by mouth nightly for mood stabilization. Lipid panel and hemoglobin A1c are normal. No prolongation of QTC on EKG.  2. Hypertension. Lisinopril was discontinued due to increased risk for lithium toxicity. We continue to monitor blood pressure.  VSS  3. Low TSH. Free T4 is normal.  4. Tobacco use disorder. Nicotine patch 21 mg a day is available.  5.  Metabolic syndrome monitoring. Lipid panel and hemoglobin A1care normal.  6. EKG. Normal EKG. QT 354.   7. Disposition. He will be discharged to home. He will follow-up with Dr.  LEduard Clos        KJay Schlichter MD 12/10/2015, 12:32 PM

## 2015-12-10 NOTE — Plan of Care (Signed)
Problem: Safety: Goal: Periods of time without injury will increase Outcome: Progressing Patient has remained free from injury this shift.  Patient safety maintained with 15 minute checks.  Will continue to monitor.

## 2015-12-10 NOTE — Progress Notes (Signed)
D: Patient is alert and oriented on the unit this shift. Patient attended   in groups today. Patient denies suicidal ideation, homicidal ideation, auditory or visual hallucinations at the present time.  A: Scheduled medications are administered to patient as per MD orders. Emotional support and encouragement are provided. Patient is maintained on q.15 minute safety checks. Patient is informed to notify staff with questions or concerns. R: No adverse medication reactions are noted. Patient is cooperative with medication administration and treatment plan today. Patient is non receptive,  paranoid and figity and cooperative on the unit at this time. Patient does not interacts  with others on the unit this shift. Patient contracts for safety at this time. Patient remains safe at this time.

## 2015-12-10 NOTE — Progress Notes (Signed)
VPA level was toxic at 103. Due to slurred speech will hold Depakote tonight and restart tomorrow at a lower dose of 500mg  po BID

## 2015-12-10 NOTE — Progress Notes (Signed)
D:  Patient lethargic at beginning of the shift but is alert at the present time.  Patient denies SI/AVH/HI.   A:  Patient encouraged to attend groups.  Patient administered scheduled medications.  Patient offered encouragement and support. R:  Patient refused to attend groups.  Patient refused support and encouragement.  Patient safety maintained with 15 minute checks.

## 2015-12-11 NOTE — Progress Notes (Signed)
[facility] MD Progress Note  12/11/2015 12:29 PM Tony Fowler  MRN:  [id number]  Subjective: [name] is a 71 year old male-year-old bipolar disorder admitted in a manic, psychotic episode. His lithium was recently discontinued due to decreasing kidney function. The patient was restarted on lithium in the hospital but I discontinued it today as his BUN and Cr increased.  Follow-up on September 4. Patient says his mood is feeling better. He doesn't feel as agitated. He doesn't have any specific complaints today. His speech does not appear to be slurred today. He is not showing any dizziness or unsteadiness and doesn't seem to be oversedated. Appears to have good insight. Denies suicidal or homicidal ideation. Somewhat more realistic and less agitated discussing his discharge plan.  Substance abuse history: The patient denies any history of any substance use  Trauma: Use will need to be investigated further as this was not addressed during this assessment. He had reported in the past having PTSD from his time in the [employer].  Past Psychiatric History: [name]. Says that he has an appointment in October. He has been hospitalized at least 5 times in the past for what appears to be mania. He denies any history suicidal attempts or self-injurious behaviors.    Principal Problem: Bipolar affective disorder, manic, severe, with psychotic behavior (Grandfather) Diagnosis:   Patient Active Problem List   Diagnosis Date Noted  . Bipolar affective disorder, manic, severe, with psychotic behavior ([place]) [F31.2] 12/01/2015  . Involuntary commitment [Z04.6] 12/01/2015  . Tobacco use disorder [F17.200] 12/01/2015  . BPH (benign prostatic hyperplasia) [N40.0] 02/22/2015  . Benign hypertension [I10] 09/07/2014  . CKD (chronic kidney disease), stage III [N18.3] 09/07/2014   Total Time spent with patient: 30 minutes    Past Medical History:  Past Medical History:  Diagnosis  Date  . Bipolar 1 disorder [facility])     Past Surgical History:  Procedure Laterality Date  . TONSILECTOMY/ADENOIDECTOMY WITH MYRINGOTOMY     Family History:  Family History  Problem Relation Age of Onset  . Heart disease Mother   . Hypertension Mother   . Mental illness Mother   . Heart disease Brother   . Hypertension Brother     Social History:  History  Alcohol Use No     History  Drug Use No    Social History   Social History  . Marital status: Divorced    Spouse name: N/A  . Number of children: N/A  . Years of education: N/A   Social History Main Topics  . Smoking status: Current Every Day Smoker    Packs/day: 0.75    Years: 0.00    Types: Cigarettes  . Smokeless tobacco: Never Used  . Alcohol use No  . Drug use: No  . Sexual activity: Not Asked   Other Topics Concern  . None   Social History Narrative  . None     Current Medications: Current Facility-Administered Medications  Medication Dose Route Frequency Provider Last Rate Last Dose  . acetaminophen (TYLENOL) tablet 650 mg  650 mg Oral Q6H PRN Gonzella Lex, MD      . alum & mag hydroxide-simeth (MAALOX/MYLANTA) 200-200-20 MG/5ML suspension 30 mL  30 mL Oral Q4H PRN Gonzella Lex, MD      . divalproex (DEPAKOTE) DR tablet 500 mg  500 mg Oral BID Chauncey Mann, MD   500 mg at 12/11/15 6269  . magnesium hydroxide (MILK OF MAGNESIA) suspension 30 mL  30 mL Oral Daily PRN Gonzella Lex, MD      . nicotine (NICODERM CQ - dosed in mg/24 hours) patch 21 mg  21 mg Transdermal Daily Hildred Priest, MD   21 mg at 12/11/15 0840  . QUEtiapine (SEROQUEL) tablet 300 mg  300 mg Oral QHS Clovis Fredrickson, MD   300 mg at 12/10/15 2135    Lab Results:  Results for orders placed or performed during the hospital encounter of 12/01/15 (from the past 48 hour(s))  Basic metabolic panel     Status: Abnormal   Collection Time: 12/10/15  6:48 AM  Result Value Ref Range   Sodium 139 135 - 145 mmol/L    Potassium 4.9 3.5 - 5.1 mmol/L   Chloride 109 101 - 111 mmol/L   CO2 25 22 - 32 mmol/L   Glucose, Bld 107 (H) 65 - 99 mg/dL   BUN 22 (H) 6 - 20 mg/dL   Creatinine, Ser 1.19 0.61 - 1.24 mg/dL   Calcium 9.8 8.9 - 10.3 mg/dL   GFR calc non Af Amer >60 >60 mL/min   GFR calc Af Amer >60 >60 mL/min    Comment: (NOTE) The eGFR has been calculated using the CKD EPI equation. This calculation has not been validated in all clinical situations. eGFR's persistently <60 mL/min signify possible Chronic Kidney Disease.    Anion gap 5 5 - 15  Valproic acid level     Status: Abnormal   Collection Time: 12/10/15 12:30 PM  Result Value Ref Range   Valproic Acid Lvl 103 (H) 50.0 - 100.0 ug/mL  Hepatic function panel     Status: Abnormal   Collection Time: 12/10/15 12:30 PM  Result Value Ref Range   Total Protein 6.9 6.5 - 8.1 g/dL   Albumin 3.8 3.5 - 5.0 g/dL   AST 18 15 - 41 U/L   ALT 13 (L) 17 - 63 U/L   Alkaline Phosphatase 54 38 - 126 U/L   Total Bilirubin 0.3 0.3 - 1.2 mg/dL   Bilirubin, Direct <0.1 (L) 0.1 - 0.5 mg/dL   Indirect Bilirubin NOT CALCULATED 0.3 - 0.9 mg/dL  Ammonia     Status: None   Collection Time: 12/10/15 12:30 PM  Result Value Ref Range   Ammonia 18 9 - 35 umol/L    Blood Alcohol level:  Lab Results  Component Value Date   ETH <5 40/81/4481    Metabolic Disorder Labs: Lab Results  Component Value Date   HGBA1C 6.0 12/01/2015   No results found for: PROLACTIN Lab Results  Component Value Date   CHOL 191 12/01/2015   TRIG 189 (H) 12/01/2015   HDL 46 12/01/2015   CHOLHDL 4.2 12/01/2015   VLDL 38 12/01/2015   LDLCALC 107 (H) 12/01/2015   LDLCALC 124 (H) 02/22/2015    Physical Findings: AIMS: Facial and Oral Movements Muscles of Facial Expression: None, normal Lips and Perioral Area: None, normal Jaw: None, normal Tongue: None, normal,Extremity Movements Upper (arms, wrists, hands, fingers): None, normal Lower (legs, knees, ankles, toes): None,  normal, Trunk Movements Neck, shoulders, hips: None, normal, Overall Severity Severity of abnormal movements (highest score from questions above): None, normal Incapacitation due to abnormal movements: None, normal Patient's awareness of abnormal movements (rate only patient's report): No Awareness, Dental Status Current problems with teeth and/or dentures?: No Does patient usually wear dentures?: No  CIWA:    COWS:     Musculoskeletal: Strength & Muscle Tone: within normal limits Gait & Station:  normal Patient leans: N/A  Psychiatric Specialty Exam: Physical Exam  Nursing note and vitals reviewed. Constitutional: He is oriented to person, place, and time. He appears well-developed and well-nourished.  HENT:  Head: Normocephalic and atraumatic.  Eyes: Conjunctivae are normal. Pupils are equal, round, and reactive to light.  Neck: Normal range of motion.  Cardiovascular: Normal heart sounds.   Respiratory: Effort normal.  GI: Soft.  Musculoskeletal: Normal range of motion.  Neurological: He is alert and oriented to person, place, and time.  Skin: Skin is warm and dry.  Psychiatric: His affect is blunt. His speech is delayed. He is slowed. He expresses impulsivity. He expresses no suicidal ideation. He exhibits abnormal recent memory.    Review of Systems  Constitutional: Positive for weight loss. Negative for chills, fever and malaise/fatigue.  HENT: Negative.  Negative for congestion, ear pain, hearing loss and sore throat.   Eyes: Negative.  Negative for blurred vision, double vision and photophobia.  Respiratory: Negative.  Negative for cough, hemoptysis, sputum production, shortness of breath and wheezing.   Cardiovascular: Negative.  Negative for chest pain, palpitations, claudication and PND.  Gastrointestinal: Negative.  Negative for abdominal pain, constipation, diarrhea, heartburn, nausea and vomiting.  Genitourinary: Negative.  Negative for dysuria, frequency and  urgency.  Musculoskeletal: Negative.   Skin: Negative.  Negative for itching and rash.  Neurological: Positive for tremors and speech change. Negative for dizziness, tingling, sensory change, focal weakness, seizures, loss of consciousness, weakness and headaches.  Endo/Heme/Allergies: Negative.   Psychiatric/Behavioral: Positive for depression. Negative for hallucinations, memory loss, substance abuse and suicidal ideas. The patient is nervous/anxious. The patient does not have insomnia.   All other systems reviewed and are negative.   Blood pressure 129/85, pulse 80, temperature 98.3 F (36.8 C), resp. rate 18, height _0  (1.676 m), weight 59.4 kg (131 lb), SpO2 100 %.Body mass index is 21.14 kg/m.  General Appearance: Fairly Groomed  Eye Contact:  Fair  Speech:  Slow and Slurred  Volume:  Decreased  Mood:  Dysphoric  Affect:  Blunt  Thought Process:  More Organized Today  Orientation:  Full (Time, Place, and Person)  Thought Content:  Hallucinations: None  Suicidal Thoughts:  No  Homicidal Thoughts:  No  Memory:  Immediate;   Fair Recent;   Fair Remote;   Fair  Judgement:  Impaired  Insight:  Shallow  Psychomotor Activity:  Decreased  Concentration:  Concentration: Fair and Attention Span: Fair  Recall:  [employer] of Knowledge:  Good  Language:  Good  Akathisia:  No  Handed:    AIMS (if indicated):     Assets:  Communication Skills Social Support  ADL's:  Intact  Cognition:  WNL  Sleep:  Number of Hours: 7.25     Treatment Plan Summary:  Tony Fowler is a 70 year old male with history of bipolar illness admitted in a manic, psychotic episode in the context of medication noncompliance.  1. Bipolar disorder current episode manic: Patient is continuing on Depakote. Dose was slightly decreased because of some perceived side effects. He seems to be stabilizing on his current Seroquel and Depakote. He is glad of this and has some concerns about lithium. Medically however  he seems to be quite stable now. No change to medicine for today. Further Depakote level can probably be checked in another couple days depending on side effects. Blood pressure is looking stable.   2. Hypertension. Lisinopril was discontinued due to increased risk for lithium toxicity. We continue to  monitor blood pressure.  VSS  3. Low TSH. Free T4 is normal.  4. Tobacco use disorder. Nicotine patch 21 mg a day is available.  5.  Metabolic syndrome monitoring. Lipid panel and hemoglobin A1care normal.  6. EKG. Normal EKG. QT 354.   7. Disposition. He will be discharged to home. He will follow-up with Tony Fowler.        Alethia Berthold, MD 12/11/2015, 12:29 PM

## 2015-12-11 NOTE — Plan of Care (Signed)
Problem: Coping: Goal: Ability to demonstrate self-control will improve Outcome: Progressing Less irritability noted today. Awake, alert on unit appropriately interacting with peers/staff. Support and encouragement provided. Will continue to monitor.

## 2015-12-11 NOTE — Progress Notes (Signed)
D: Observed pt in dayroom interacting with peers. Patient alert and oriented x4. Patient denies SI/HI/AVH. Pt affect blunted. Pt stated his day was "ok once I got rid of the Ativan." Pt still irritable, but less so than previous nights. Pt stated his mood is "same as always." Pt rated depression 0/10 and anxiety 5/10.  A: Offered active listening and support. Provided therapeutic communication. Administered scheduled medications.  R: Pt cooperative. Pt medication compliant. Will continue Q15 min. checks. Safety maintained.

## 2015-12-11 NOTE — [facility] Group Notes (Signed)
[facility]  12/11/2015 3:32 PM  Participation Level:  Active  Description of Group:    In this group patients will be encouraged to explore what they see as obstacles to their own wellness and recovery. They will be guided to discuss their thoughts, feelings, and behaviors related to these obstacles. The group will process together ways to cope with barriers, with attention given to specific choices patients can make. Each patient will be challenged to identify changes they are motivated to make in order to overcome their obstacles. This group will be process-oriented, with patients participating in exploration of their own experiences as well as giving and receiving support and challenge from other group members.  Therapeutic Goals: 1. Patient will identify personal and current obstacles as they relate to admission. 2. Patient will identify barriers that currently interfere with their wellness or overcoming obstacles.  3. Patient will identify feelings, thought process and behaviors related to these barriers. 4. Patient will identify two changes they are willing to make to overcome these obstacles:    Summary of Patient Progress Pt  unable to meet therapeutic goals.  Remains tangential and often talking about things that are irrelevant to group discussion. Heavily religiously focused today.  Requiring frequent redirection and peers getting frustrated with him commenting so frequently.  Group discussed qualities of resilience or persistence that could benefit recovery efforts and overcoming the obstacles faced.  Pt has poor insight  Therapeutic Modalities:   Cognitive Behavioral Therapy Solution Focused Therapy Motivational Interviewing Relapse Prevention Therapy  Tony Fowler, MSW, LCSW 12/11/2015, 3:32 PM

## 2015-12-11 NOTE — Plan of Care (Signed)
Problem: Education: Goal: Verbalization of understanding the information provided will improve Outcome: Progressing Pt regularly questions legitimacy of  information given to him by staff.

## 2015-12-11 NOTE — [facility] Group Notes (Signed)
[facility] Group Notes:  (Nursing/MHT/Case Management/Adjunct)  Date:  12/11/2015  Time:  9:48 PM  Type of Therapy:  Evening Wrap-up Group  Participation Level:  Active  Participation Quality:  Appropriate and Attentive  Affect:  Appropriate  Cognitive:  Alert and Appropriate  Insight:  Appropriate and Good  Engagement in Group:  Engaged  Modes of Intervention:  Activity  Summary of Progress/Problems:  Tomasita MorrowChelsea Nanta Maeve Debord 12/11/2015, 9:48 PM

## 2015-12-11 NOTE — Plan of Care (Signed)
Problem: Health Behavior/Discharge Planning: Goal: Compliance with treatment plan for underlying cause of condition will improve Outcome: Progressing Pt attending groups on the unit and taking medications as prescribed.  Problem: Safety: Goal: Periods of time without injury will increase Outcome: Progressing Pt remains free from harm.

## 2015-12-11 NOTE — Progress Notes (Signed)
D: Noted to have decrease in irritability today. Awake, alert, up on unit today interacting appropriately with peers. Attends group. Rated anxiety 5/10, depression and hopelessness 1/10.   A: Provided support and encouragement. Administered medications as ordered. Every 15 minute checks for safety.  R: Cooperative and compliant with medications. Safety maintained with 15 minute checks. Attends group.

## 2015-12-11 NOTE — [facility] Group Notes (Signed)
[facility] Group Notes:  (Nursing/MHT/Case Management/Adjunct)  Date:  12/11/2015  Time:  5:30 AM  Type of Therapy:  Psychoeducational Skills  Participation Level:  Active  Participation Quality:  Appropriate, Attentive and Sharing  Affect:  Appropriate  Cognitive:  Appropriate  Insight:  Appropriate  Engagement in Group:  Engaged  Modes of Intervention:  Discussion, Socialization and Support  Summary of Progress/Problems:  Tony MilroyLaquanda Y Valdez Fowler 12/11/2015, 5:30 AM

## 2015-12-12 NOTE — Progress Notes (Signed)
[facility]  12/12/2015 12:49 PM Tony Fowler  MRN:  [id number]  Subjective: Tony Fowler is a 70 year old male-year-old bipolar disorder admitted in a manic, psychotic episode. His lithium was recently discontinued due to decreasing kidney function. The patient was restarted on lithium in the hospital but I discontinued it today as his BUN and Cr increased.  Over the weekend Tony Fowler appeared sedated and had slurred speech.It was thought to be due to high VPA level of 103 and Depakote dose was lowered. We are still awaiting free valproic acid level. The patient feels much better today. He is not somnolent. His speech is clear. His thinking however is still very disorganized and tangential. He is also hyperreligious. Over the past few days his lithium was discontinued due to kidney problems, his Depakote was lowered from 1500 mg 2000 mg, and his Seroquel was increased to 300 mg nightly. There are no somatic complaints. The patient participates in group but is rather disruptive.  Principal Problem: Bipolar affective disorder, manic, severe, with psychotic behavior ([facility]) Diagnosis:   Patient Active Problem List   Diagnosis Date Noted  . Bipolar affective disorder, manic, severe, with psychotic behavior ([facility]) [F31.2] 12/01/2015  . Involuntary commitment [Z04.6] 12/01/2015  . Tobacco use disorder [F17.200] 12/01/2015  . BPH (benign prostatic hyperplasia) [N40.0] 02/22/2015  . Benign hypertension [I10] 09/07/2014  . CKD (chronic kidney disease), stage III [N18.3] 09/07/2014   Total Time spent with patient: 20 minutes    Past Medical History:  Past Medical History:  Diagnosis Date  . Bipolar 1 disorder [facility](HCC)     Past Surgical History:  Procedure Laterality Date  . TONSILECTOMY/ADENOIDECTOMY WITH MYRINGOTOMY     Family History:  Family History  Problem Relation Age of Onset  . Heart disease Mother   . Hypertension Mother   . Mental illness Mother   . Heart disease Brother    . Hypertension Brother     Social History:  History  Alcohol Use No     History  Drug Use No    Social History   Social History  . Marital status: Divorced    Spouse name: N/A  . Number of children: N/A  . Years of education: N/A   Social History Main Topics  . Smoking status: Current Every Day Smoker    Packs/day: 0.75    Years: 0.00    Types: Cigarettes  . Smokeless tobacco: Never Used  . Alcohol use No  . Drug use: No  . Sexual activity: Not Asked   Other Topics Concern  . None   Social History Narrative  . None     Current Medications: Current Facility-Administered Medications  Medication Dose Route Frequency Provider Last Rate Last Dose  . acetaminophen (TYLENOL) tablet 650 mg  650 mg Oral Q6H PRN Audery AmelJohn T Clapacs, MD      . alum & mag hydroxide-simeth (MAALOX/MYLANTA) 200-200-20 MG/5ML suspension 30 mL  30 mL Oral Q4H PRN Audery AmelJohn T Clapacs, MD      . divalproex (DEPAKOTE) DR tablet 500 mg  500 mg Oral BID Darliss RidgelAarti K Kapur, MD   500 mg at 12/12/15 0816  . magnesium hydroxide (MILK OF MAGNESIA) suspension 30 mL  30 mL Oral Daily PRN Audery AmelJohn T Clapacs, MD      . nicotine (NICODERM CQ - dosed in mg/24 hours) patch 21 mg  21 mg Transdermal Daily Jimmy FootmanAndrea Hernandez-Gonzalez, MD   21 mg at 12/12/15 0818  . QUEtiapine (SEROQUEL) tablet 300 mg  300  mg Oral QHS Shari Prows, MD   300 mg at 12/11/15 2138    Lab Results:  No results found for this or any previous visit (from the past 48 hour(s)).  Blood Alcohol level:  Lab Results  Component Value Date   ETH <5 12/01/2015    Metabolic Disorder Labs: Lab Results  Component Value Date   HGBA1C 6.0 12/01/2015   No results found for: PROLACTIN Lab Results  Component Value Date   CHOL 191 12/01/2015   TRIG 189 (H) 12/01/2015   HDL 46 12/01/2015   CHOLHDL 4.2 12/01/2015   VLDL 38 12/01/2015   LDLCALC 107 (H) 12/01/2015   LDLCALC 124 (H) 02/22/2015    Physical Findings: AIMS: Facial and Oral  Movements Muscles of Facial Expression: None, normal Lips and Perioral Area: None, normal Jaw: None, normal Tongue: None, normal,Extremity Movements Upper (arms, wrists, hands, fingers): None, normal Lower (legs, knees, ankles, toes): None, normal, Trunk Movements Neck, shoulders, hips: None, normal, Overall Severity Severity of abnormal movements (highest score from questions above): None, normal Incapacitation due to abnormal movements: None, normal Patient's awareness of abnormal movements (rate only patient's report): No Awareness, Dental Status Current problems with teeth and/or dentures?: No Does patient usually wear dentures?: No  CIWA:    COWS:     Musculoskeletal: Strength & Muscle Tone: within normal limits Gait & Station: normal Patient leans: N/A  Psychiatric Specialty Exam: Physical Exam  Nursing note and vitals reviewed. Skin: Skin is dry.  Psychiatric: His affect is blunt. His speech is delayed. He is slowed. He expresses impulsivity. He expresses no suicidal ideation. He exhibits abnormal recent memory.    Review of Systems  Constitutional: Positive for weight loss. Negative for chills, fever and malaise/fatigue.  HENT: Negative for congestion, ear pain, hearing loss and sore throat.   Eyes: Negative for blurred vision, double vision and photophobia.  Respiratory: Negative for cough, hemoptysis, sputum production, shortness of breath and wheezing.   Cardiovascular: Negative for chest pain, palpitations, claudication and PND.  Gastrointestinal: Negative for abdominal pain, constipation, diarrhea, heartburn, nausea and vomiting.  Genitourinary: Negative for dysuria, frequency and urgency.  Skin: Negative for itching and rash.  Neurological: Positive for tremors. Negative for dizziness, tingling, sensory change, focal weakness, seizures, loss of consciousness, weakness and headaches.  Psychiatric/Behavioral: Negative for hallucinations, memory loss, substance abuse  and suicidal ideas. The patient is nervous/anxious. The patient does not have insomnia.   All other systems reviewed and are negative.   Blood pressure 135/80, pulse 76, temperature 98 F (36.7 C), temperature source Oral, resp. rate 18, height 5\' 6"  (1.676 m), weight 59.4 kg (131 lb), SpO2 100 %.Body mass index is 21.14 kg/m.  General Appearance: Fairly Groomed  Eye Contact:  Fair  Speech:  Clear and Coherent, Slow and Slurred  Volume:  Normal  Mood:  Dysphoric and Irritable  Affect:  Blunt and Congruent  Thought Process:  Disorganized.   Orientation:  Full (Time, Place, and Person)  Thought Content:  Delusions and Paranoid Ideation  Suicidal Thoughts:  No  Homicidal Thoughts:  No  Memory:  Immediate;   Fair Recent;   Fair Remote;   Fair  Judgement:  Impaired  Insight:  Shallow  Psychomotor Activity:  Decreased  Concentration:  Concentration: Fair and Attention Span: Fair  Recall:  [employer] of Knowledge:  Good  Language:  Good  Akathisia:  No  Handed:    AIMS (if indicated):     Assets:  Communication Skills  Social Support  ADL's:  Intact  Cognition:  WNL  Sleep:  Number of Hours: 6     Treatment Plan Summary:  Mr. Antony is a 70 year old male with history of bipolar illness admitted in a manic, psychotic episode in the context of medication noncompliance.  1. Mood/psychosis. We discontinued Lithium. We continued Seroquel and Depakote for psychosis and mood stabilization. Depakote dose was lowered to 1000 mg daily as his VPA was 103. Free VPA level is pending.  2. Hypertension. Lisinopril was discontinued due to increased risk for lithium toxicity. We continue to monitor blood pressure. Vital signs are stable.  3. Low TSH. Free T4 is normal.  4. Tobacco use disorder. Nicotine patch  is available.  5.  Metabolic syndrome monitoring. Lipid panel and hemoglobin A1care normal.  6. EKG. Normal EKG. QT 354.   7. Disposition. He will be discharged to home. He  will follow-up with Dr. Lesle Chris.        Kristine Linea, MD 12/12/2015, 12:49 PM

## 2015-12-12 NOTE — Progress Notes (Signed)
D: Pt is observed in the milieu interacting appropriately with peers this evening. He attends wrap up group. Denies SI/HI/AVH at this time. A: Emotional support and encouragement provided. Medications administered with education. q15 minute safety checks maintained. R: Pt remains free from harm. Will continue to monitor.

## 2015-12-12 NOTE — Progress Notes (Signed)
Patient ID: Tony Fowler, male   DOB: 11/07/1945, 70 y.o.   MRN: [id number] PER STATE REGULATIONS 482.30  THIS CHART WAS REVIEWED FOR MEDICAL NECESSITY WITH RESPECT TO THE PATIENT'S ADMISSION/ DURATION OF STAY.  NEXT REVIEW DATE: 12/13/2015  Willa RoughJENNIFER JONES Marielys Trinidad, RN, BSN CASE MANAGER

## 2015-12-12 NOTE — Progress Notes (Signed)
D: Continues to have disorganized thinking. Attends group, but reportedly caused a disruption in group with peers. Pt redirected, but continues to be very blunt and open regarding his thoughts. Denies SI/HI/AVH.   A: Medications administered as ordered. Support and encouragement provided. Every 15 minute safety checks.   R: Medication compliant. Safety maintained. Attends group.  Will continue to monitor.

## 2015-12-12 NOTE — Plan of Care (Signed)
Problem: Coping: Goal: Ability to verbalize frustrations and anger appropriately will improve Outcome: Progressing Attends groups, interacts with peers, but continues to be blunt and open with thoughts/feelings that offend others. Support and encouragement provided. Will continue to monitor.

## 2015-12-12 NOTE — [facility] Group Notes (Signed)
Goals Group  Date/Time: 9:00AM Type of Therapy and Topic: Group Therapy: Goals Group: SMART Goals  ?  Participation Level: Moderate  ?  Description of Group:  ?  The purpose of a daily goals group is to assist and guide patients in setting recovery/wellness-related goals. The objective is to set goals as they relate to the crisis in which they were admitted. Patients will be using SMART goal modalities to set measurable goals. Characteristics of realistic goals will be discussed and patients will be assisted in setting and processing how one will reach their goal. Facilitator will also assist patients in applying interventions and coping skills learned in psycho-education groups to the SMART goal and process how one will achieve defined goal.  ?  Therapeutic Goals:  ?  -Patients will develop and document one goal related to or their crisis in which brought them into treatment.  -Patients will be guided by LCSW using SMART goal setting modality in how to set a measurable, attainable, realistic and time sensitive goal.  -Patients will process barriers in reaching goal.  -Patients will process interventions in how to overcome and successful in reaching goal.  ?  Patient's Goal: Patient stated that his goal today is to be more cooperative in order to discharge. He stated that in order to do that, he will communicate effectively with his treatment team. ?  Therapeutic Modalities:  Motivational Interviewing  Cognitive Behavioral Therapy  Crisis Intervention Model  SMART goals setting   Lynden OxfordKadijah R. Malena Timpone, MSW, LCSW-A 12/12/2015, 10:54AM

## 2015-12-12 NOTE — [facility] Group Notes (Signed)
[facility] LCSW Group Therapy   12/12/2015 9:30am  Type of Therapy: Group Therapy   Participation Level: Active   Participation Quality: Attentive, Sharing and Supportive   Affect: Appropriate  Cognitive: Alert and Oriented   Insight: Developing/Improving and Engaged   Engagement in Therapy: Developing/Improving and Engaged   Modes of Intervention: Clarification, Confrontation, Discussion, Education, Exploration,  Limit-setting, Orientation, Problem-solving, Rapport Building, Dance movement psychotherapisteality Testing, Socialization and Support  Summary of Progress/Problems: The topic for group therapy was feelings about diagnosis. Pt actively participated in group discussion on their past and current diagnosis and how they feel towards this. Pt also identified how society and family members judge them, based on their diagnosis as well as stereotypes and stigmas. Patient stated that he has dealt with his diagnosis for years and his feelings have not changed. He stated that as long as he separates himself from negative situations then he can manage his negative symptoms. He believes that his PTSD symptoms cause him to be short fused which "gets [him] in a world of trouble." Patient identified coping mechanisms to help when symptoms emerge.    [name] [name], MSW, Theresia MajorsLCSWA

## 2015-12-12 NOTE — Progress Notes (Signed)
Patient ID: Tony Fowler, male   DOB: 03/20/1946, 70 y.o.   MRN: [id number] PER STATE REGULATIONS 482.30  THIS CHART WAS REVIEWED FOR MEDICAL NECESSITY WITH RESPECT TO THE PATIENT'S ADMISSION/ DURATION OF STAY.  NEXT REVIEW DATE: 12/05/2015  Willa RoughJENNIFER JONES Madalaine Portier, RN, BSN CASE MANAGER

## 2015-12-12 NOTE — Progress Notes (Signed)
Patient ID: Tony Fowler, male   DOB: 09/01/1945, 70 y.o.   MRN: [id number] PER STATE REGULATIONS 482.30  THIS CHART WAS REVIEWED FOR MEDICAL NECESSITY WITH RESPECT TO THE PATIENT'S ADMISSION/ DURATION OF STAY.  NEXT REVIEW DATE:  12/09/2015 Willa RoughJENNIFER JONES Nareh Matzke, RN, BSN CASE MANAGER

## 2015-12-13 LAB — VALPROIC ACID LEVEL, FREE: Valproic Acid, Free: 26.9 ug/mL — ABNORMAL HIGH (ref 6.0–22.0)

## 2015-12-13 MED ORDER — DIVALPROEX SODIUM 500 MG PO DR TAB: 500.0000 mg | DELAYED_RELEASE_TABLET | Freq: Two times a day (BID) | ORAL | 1 refills | Status: DC

## 2015-12-13 MED ORDER — QUETIAPINE FUMARATE 300 MG PO TABS: 300.0000 mg | ORAL_TABLET | Freq: Every day | ORAL | 2 refills | Status: DC

## 2015-12-13 NOTE — Progress Notes (Signed)
D:  Patient denies SI/AVH/HI.  Patient interacting with peers and staff inappropriately by making comments and statements with racial undertones.   A:  Patient encouraged to interact with staff and peers appropriately.  Patient administered scheduled medications.  Patient encouraged to attend groups. R:  Patient safety maintained with 15 minute checks.  Patient is medication compliant.  Patient continues to interact with staff and peers inappropriately.

## 2015-12-13 NOTE — [facility] Group Notes (Signed)
[facility] LCSW Group Therapy   12/13/2015  9:30 am   Type of Therapy: Group Therapy   Participation Level: Active   Participation Quality: Attentive, Sharing and Supportive   Affect: Appropriate  Cognitive: Alert and Oriented   Insight: Developing/Improving and Engaged   Engagement in Therapy: Developing/Improving and Engaged   Modes of Intervention: Clarification, Confrontation, Discussion, Education, Exploration, Limit-setting, Orientation, Problem-solving, Rapport Building, Dance movement psychotherapisteality Testing, Socialization and Support   Summary of Progress/Problems: The topic for group today was emotional regulation. This group focused on both positive and negative emotion identification and allowed  group members to process ways to identify feelings, regulate negative emotions, and find healthy ways to manage internal/external emotions. Group members were asked to reflect on a time when their reaction to an emotion led to a negative outcome and explored how alternative responses using emotion regulation would have benefited them. Group members were also asked to discuss a time when emotion regulation was utilized when a negative emotion was experienced. Patient identifies his two current emotions as anger and love. He stated that he has been dealing with his diagnoses for a while and does not have the proper coping mechanisms to help regulate his emotions.    Tony AbbotKadijah Rumi Kolodziej, MSW, Theresia MajorsLCSWA

## 2015-12-13 NOTE — Plan of Care (Signed)
Problem: Safety: Goal: Periods of time without injury will increase Outcome: Progressing Patient has remained free from injury this shift.  Patient provided with a safe environment.  15 minute checks completed to maintain safety.

## 2015-12-13 NOTE — [facility] Suicide Risk Assessment (Signed)
[facility]   Principal Problem: Bipolar affective disorder, manic, severe, with psychotic behavior [facility](HCC) Discharge Diagnoses:  Patient Active Problem List   Diagnosis Date Noted  . Bipolar affective disorder, manic, severe, with psychotic behavior (HCC) [F31.2] 12/01/2015  . Involuntary commitment [Z04.6] 12/01/2015  . Tobacco use disorder [F17.200] 12/01/2015  . BPH (benign prostatic hyperplasia) [N40.0] 02/22/2015  . Benign hypertension [I10] 09/07/2014  . CKD (chronic kidney disease), stage III [N18.3] 09/07/2014    Total Time spent with patient: 30 minutes  Musculoskeletal: Strength & Muscle Tone: within normal limits Gait & Station: normal Patient leans: N/A  Psychiatric Specialty Exam: Review of Systems  All other systems reviewed and are negative.   Blood pressure 138/84, pulse 75, temperature 98.1 F (36.7 C), resp. rate 18, height 5\' 6"  (1.676 m), weight 59.4 kg (131 lb), SpO2 100 %.Body mass index is 21.14 kg/m.  General Appearance: Casual  Eye Contact::  Good  Speech:  Clear and Coherent409  Volume:  Normal  Mood:  Euphoric  Affect:  Appropriate  Thought Process:  Goal Directed  Orientation:  Full (Time, Place, and Person)  Thought Content:  Delusions and Paranoid Ideation  Suicidal Thoughts:  No  Homicidal Thoughts:  No  Memory:  Immediate;   Fair Recent;   Fair Remote;   Fair  Judgement:  Impaired  Insight:  Shallow  Psychomotor Activity:  Normal  Concentration:  Fair  Recall:  [employer]Fair  Fund of Knowledge:Fair  Language: Fair  Akathisia:  No  Handed:  Right  AIMS (if indicated):     Assets:  Communication Skills Desire for Improvement Financial Resources/Insurance Housing Physical Health Resilience Social Support  Sleep:  Number of Hours: 7.15  Cognition: WNL  ADL's:  Intact   Mental Status Per Nursing Assessment::   On Admission:     Demographic Factors:  Male, Age 70 or older and Caucasian  Loss  Factors: Financial problems/change in socioeconomic status  Historical Factors: Impulsivity  Risk Reduction Factors:   Sense of responsibility to family, Living with another person, especially a relative, Positive social support and Positive therapeutic relationship  Continued Clinical Symptoms:  Bipolar Disorder:   Mixed State  Cognitive Features That Contribute To Risk:  None    Suicide Risk:  Minimal: No identifiable suicidal ideation.  Patients presenting with no risk factors but with morbid ruminations; may be classified as minimal risk based on the severity of the depressive symptoms  Follow-up Information    [facility]           Plan Of Care/Follow-up recommendations:  Activity:  as tolerated. Diet:  low sodium heart healthy. Other:  keep follow up appointments.  Kristine LineaJolanta Navarre Diana, MD 12/13/2015, 9:18 PM

## 2015-12-13 NOTE — Progress Notes (Addendum)
[facility] MD Progress Note  12/13/2015 1:41 PM Tony Fowler  MRN:  [id number]  Subjective:  Tony Fowler reports feeling much better today and requests discharge. In the he is much more organized in his thinking and is able to hold an normal conversation. She is still somewhat euphoric and paranoid refusing to disclose any information about his [employer] Service be living that it is top-secret. He is [employer]. He accepts medications and tolerates it well. He knows his medicines and reasons he is taking them. We discussed anything Tegretol to his regimen and he is on the lithium now but he refused. There are no somatic complaints. He participates in programming.   Principal Problem: Bipolar affective disorder, manic, severe, with psychotic behavior (HCC) Diagnosis:   Patient Active Problem List   Diagnosis Date Noted  . Bipolar affective disorder, manic, severe, with psychotic behavior (HCC) [F31.2] 12/01/2015  . Involuntary commitment [Z04.6] 12/01/2015  . Tobacco use disorder [F17.200] 12/01/2015  . BPH (benign prostatic hyperplasia) [N40.0] 02/22/2015  . Benign hypertension [I10] 09/07/2014  . CKD (chronic kidney disease), stage III [N18.3] 09/07/2014   Total Time spent with patient: 20 minutes  Past Psychiatric History: Bipolar disorder.  Past Medical History:  Past Medical History:  Diagnosis Date  . Bipolar 1 disorder [facility])     Past Surgical History:  Procedure Laterality Date  . TONSILECTOMY/ADENOIDECTOMY WITH MYRINGOTOMY     Family History:  Family History  Problem Relation Age of Onset  . Heart disease Mother   . Hypertension Mother   . Mental illness Mother   . Heart disease Brother   . Hypertension Brother    Family Psychiatric  History: See H&P. Social History:  History  Alcohol Use No     History  Drug Use No    Social History   Social History  . Marital status: Divorced    Spouse name: N/A  . Number of children: N/A  . Years of education: N/A   Social History  Main Topics  . Smoking status: Current Every Day Smoker    Packs/day: 0.75    Years: 0.00    Types: Cigarettes  . Smokeless tobacco: Never Used  . Alcohol use No  . Drug use: No  . Sexual activity: Not Asked   Other Topics Concern  . None   Social History Narrative  . None   Additional Social History:                         Sleep: Fair  Appetite:  Fair  Current Medications: Current Facility-Administered Medications  Medication Dose Route Frequency Provider Last Rate Last Dose  . acetaminophen (TYLENOL) tablet 650 mg  650 mg Oral Q6H PRN Audery Amel, MD      . alum & mag hydroxide-simeth (MAALOX/MYLANTA) 200-200-20 MG/5ML suspension 30 mL  30 mL Oral Q4H PRN Audery Amel, MD      . divalproex (DEPAKOTE) DR tablet 500 mg  500 mg Oral BID Darliss Ridgel, MD   500 mg at 12/13/15 0754  . magnesium hydroxide (MILK OF MAGNESIA) suspension 30 mL  30 mL Oral Daily PRN Audery Amel, MD      . nicotine (NICODERM CQ - dosed in mg/24 hours) patch 21 mg  21 mg Transdermal Daily Jimmy Footman, MD   21 mg at 12/13/15 0754  . QUEtiapine (SEROQUEL) tablet 300 mg  300 mg Oral QHS Snyder Colavito B Donyae Kohn, MD   300 mg at 12/12/15  2148    Lab Results: No results found for this or any previous visit (from the past 48 hour(s)).  Blood Alcohol level:  Lab Results  Component Value Date   ETH <5 12/01/2015    Metabolic Disorder Labs: Lab Results  Component Value Date   HGBA1C 6.0 12/01/2015   No results found for: PROLACTIN Lab Results  Component Value Date   CHOL 191 12/01/2015   TRIG 189 (H) 12/01/2015   HDL 46 12/01/2015   CHOLHDL 4.2 12/01/2015   VLDL 38 12/01/2015   LDLCALC 107 (H) 12/01/2015   LDLCALC 124 (H) 02/22/2015    Physical Findings: AIMS: Facial and Oral Movements Muscles of Facial Expression: None, normal Lips and Perioral Area: None, normal Jaw: None, normal Tongue: None, normal,Extremity Movements Upper (arms, wrists, hands, fingers):  None, normal Lower (legs, knees, ankles, toes): None, normal, Trunk Movements Neck, shoulders, hips: None, normal, Overall Severity Severity of abnormal movements (highest score from questions above): None, normal Incapacitation due to abnormal movements: None, normal Patient's awareness of abnormal movements (rate only patient's report): No Awareness, Dental Status Current problems with teeth and/or dentures?: No Does patient usually wear dentures?: No  CIWA:    COWS:     Musculoskeletal: Strength & Muscle Tone: within normal limits Gait & Station: normal Patient leans: N/A  Psychiatric Specialty Exam: Physical Exam  Nursing note and vitals reviewed.   Review of Systems  Psychiatric/Behavioral: The patient is nervous/anxious.   All other systems reviewed and are negative.   Blood pressure 138/84, pulse 75, temperature 98.1 F (36.7 C), resp. rate 18, height 5\' 6"  (1.676 m), weight 59.4 kg (131 lb), SpO2 100 %.Body mass index is 21.14 kg/m.  General Appearance: Casual  Eye Contact:  Good  Speech:  Clear and Coherent  Volume:  Normal  Mood:  Euphoric  Affect:  Appropriate  Thought Process:  Goal Directed  Orientation:  Full (Time, Place, and Person)  Thought Content:  WDL  Suicidal Thoughts:  No  Homicidal Thoughts:  No  Memory:  Immediate;   Fair Recent;   Fair Remote;   Fair  Judgement:  Impaired  Insight:  Shallow  Psychomotor Activity:  Normal  Concentration:  Concentration: Fair and Attention Span: Fair  Recall:  FiservFair  Fund of Knowledge:  Fair  Language:  Fair  Akathisia:  No  Handed:  Right  AIMS (if indicated):     Assets:  Communication Skills Desire for Improvement Financial Resources/Insurance Housing Physical Health Resilience Social Support  ADL's:  Intact  Cognition:  WNL  Sleep:  Number of Hours: 7.15     Treatment Plan Summary: Daily contact with patient to assess and evaluate symptoms and progress in treatment and Medication management    Tony Fowler is a 70 year old male with history of bipolar illness admitted in a manic, psychotic episode in the context of medication noncompliance.  1. Mood/psychosis. We continued Seroquel and Depakote for psychosis and mood stabilization. We used Lithium briefly but it had to be discontinued due to kidney problems.   2. Hypertension. Lisinopril was discontinued due to increased risk for lithium toxicity. Vital signs are stable.  3. Low TSH. Free T4 is normal.  4. Tobacco use disorder. Nicotine patch  is available.  5.  Metabolic syndrome monitoring. Lipid panel and hemoglobin A1care normal.  6. EKG. Normal EKG. QT 354.   7. Disposition. He will be discharged to home. He will follow-up with Dr. Lesle ChrisLatiff.   I certify that the services received since  the previous certification/recertification were and continue to be medically necessary as the treatment provided can be reasonably expected to improve the patient's condition; the medical record documents that the services furnished were intensive treatment services or their equivalent services, and this patient continues to need, on a daily basis, active treatment furnished directly by or requiring the supervision of inpatient psychiatric personnel.    Kristine Linea, MD 12/13/2015, 1:41 PM

## 2015-12-13 NOTE — Progress Notes (Signed)
D: Pt denies SI/HI/AVH. Pt is irritable and argumentative, some confusion noted to situation. Pt appears less anxious and he is interacting with peers and staff appropriately.  A: Pt was offered support and encouragement. Pt was given scheduled medications. Pt was encouraged to attend groups. Q 15 minute checks were done for safety.  R:Pt attends groups and interacts well with peers and staff. Pt is taking medication. Pt has no complaints.Pt receptive to treatment and safety maintained on unit.

## 2015-12-13 NOTE — Care Management Utilization Note (Signed)
   Per State Regulation 482.30  This chart was reviewed for necessity with respect to the patient's Admission/ Duration of stay.  Next review date: 12/17/15  Nicolasa Duckingrystal Morrison RN, BSN

## 2015-12-13 NOTE — Progress Notes (Signed)
Recreation Therapy Notes  Date: 09.06.17 Time: 1:00 pm Location: Craft Room  Group Topic: Self-esteem  Goal Area(s) Addresses:  Patient will write at least one positive trait about self. Patient will verbalize benefit of having a healthy self-esteem.  Behavioral Response: Attentive, Interactive, Off topic, Disruptive  Intervention: I Am  Activity: Patients were given worksheets with the letter I on it and instructed to write as many positive traits inside the letter.  Education: LRT educated patients on ways they can increase their self-esteem.  Education Outcome: In group clarification offered  Clinical Observations/Feedback: Patient completed activity by writing positive traits. Patient attempted to contributed to group discussion, but patient was off topic talking about his service in the [employer] and STDs. Patient would laugh during group discussion. LRT redirected patient and patient complied.  Jacquelynn CreeGreene,Amelio Brosky M, LRT/CTRS 12/13/2015 3:01 PM

## 2015-12-13 NOTE — [facility] Group Notes (Signed)
[facility] Group Notes:  (Nursing/MHT/Case Management/Adjunct)  Date:  12/13/2015  Time:  4:05 PM  Type of Therapy:  Psychoeducational Skills  Participation Level:  Active  Participation Quality:  Appropriate, Attentive and Sharing  Affect:  Appropriate  Cognitive:  Alert and Appropriate  Insight:  Appropriate  Engagement in Group:  Engaged  Modes of Intervention:  Discussion, Education and Support  Summary of Progress/Problems:  Tony SmokeCara Fowler Tony Fowler 12/13/2015, 4:05 PM

## 2015-12-14 LAB — BASIC METABOLIC PANEL
ANION GAP: 5 (ref 5–15)
BUN: 24 mg/dL — AB (ref 6–20)
CHLORIDE: 107 mmol/L (ref 101–111)
CO2: 28 mmol/L (ref 22–32)
Calcium: 9.5 mg/dL (ref 8.9–10.3)
Creatinine, Ser: 1.24 mg/dL (ref 0.61–1.24)
GFR, EST NON AFRICAN AMERICAN: 58 mL/min — AB (ref >60)
Glucose, Bld: 105 mg/dL — ABNORMAL HIGH (ref 65–99)
POTASSIUM: 4.3 mmol/L (ref 3.5–5.1)
SODIUM: 140 mmol/L (ref 135–145)

## 2015-12-14 LAB — VALPROIC ACID LEVEL: VALPROIC ACID LVL: 54 ug/mL (ref 50.0–100.0)

## 2015-12-14 NOTE — Discharge Summary (Signed)
Physician Discharge Summary Note  Patient:  Tony Fowler is an 70 y.o., male MRN:  [id number] DOB:  [birth year] Patient phone:  [phone] (home)  Patient address:   [address] [place] [address],  Total Time spent with patient: 30 minutes  Date of Admission:  12/01/2015 Date of Discharge: 12/14/2015  Reason for Admission:  Manic episode.  70 y/o wm with History of bipolar disorder who presented under IVC to our emergency department on August 25 due to mania and aggression.  According to the IVC paperwork, patient was threatening his daguhters because they would not give him cigarettes. Pt states that he decided to change his will and his daughters became angry towards him and would not give him his cigarettes out of spite.  Per nurses last night: Pt admitted to unit without issue. Skin assessment completed, no contraband nor abnormalities noted. Pt difficult to assess due to delusional thinking. Stated that he was " In a murder ward but hadn't done anything. I just went out for coffee".Stated that he had "papers at the government facility". Pt also was laughing inappropriately. Sexually inappropriate comments made to several staff members. Asked to close med room door when he was in medication room with Clinical research associate. Proceeded to mention things such as rape and "killing your kids" Pt was referring to " They". "They have ways (guns) to rape you. They could kill your kids" "Do you want two bananas? Pt also had to be redirected for talking about staff "titties". Some aggression noted in dayroom when he jerked playing cards out of another Pts hands. Patient was med compliant with evening meds. Speech rapid. Poor hygiene. Blames daughter for admission and did not wish to sign a consent to release information.   Today during admission the patient denies having any issues with sleep, appetite, energy is sleep or concentration. He denies having suicidality, homicidality or auditory or visual  hallucinations. He says he is slept 8 hours last night. He tells me he is the most dangerous men in the world. He is concerned that his medications might affect all of his organs but is okay compliant with the current regimen.    Patient was labile, hyperverbal and somewhat argumentative.  Per nurses he was agitated also last night and they are requesting for him to have as needed medication for times of agitation or aggression.  Substance abuse history denies substance abuse  Trauma: Use will need to be investigated further as this was not addressed during this assessment. He had reported in the past having PTSD from his time in the [employer].  Prior to admission the patient reported taking Seroquel 100 mg and lithium however he stopped the lithium on May 8 in because his creatinine was abnormally elevated.  Associated Signs/Symptoms: Depression Symptoms:  denies (Hypo) Manic Symptoms:  Distractibility, Flight of Ideas, Grandiosity,  Impulsivity, Irritable Mood, Labiality of Mood, Anxiety Symptoms:  denies Psychotic Symptoms:  denies PTSD Symptoms: NA  Past Psychiatric History: Patient says he has been seeing Dr. Lourdes Sledge. Says that he has an appointment in October. He has been hospitalized at least 5 times in the past for what appears to be mania. He denies any history suicidal attempts or self-injurious behaviors.  Family Psychiatric  History: His mother was diagnosed with schizophrenia however he think she was misdiagnosed and she was actually bipolar. He denies any history of suicides in his family   Social History: Patient lives with his daughter in [place]. He has 2 adult daughters. He is divorced  from his wife. He was in the [employer], in the army for 9 years. He has been receiving Social Security disability for many years. He denies having any legal history.    Principal Problem: Bipolar affective disorder, manic, severe, with psychotic behavior [facility](HCC) Discharge  Diagnoses: Patient Active Problem List   Diagnosis Date Noted  . Bipolar affective disorder, manic, severe, with psychotic behavior (HCC) [F31.2] 12/01/2015  . Involuntary commitment [Z04.6] 12/01/2015  . Tobacco use disorder [F17.200] 12/01/2015  . BPH (benign prostatic hyperplasia) [N40.0] 02/22/2015  . Benign hypertension [I10] 09/07/2014  . CKD (chronic kidney disease), stage III [N18.3] 09/07/2014    Past Medical History:  Past Medical History:  Diagnosis Date  . Bipolar 1 disorder [facility](HCC)     Past Surgical History:  Procedure Laterality Date  . TONSILECTOMY/ADENOIDECTOMY WITH MYRINGOTOMY     Family History:  Family History  Problem Relation Age of Onset  . Heart disease Mother   . Hypertension Mother   . Mental illness Mother   . Heart disease Brother   . Hypertension Brother     Social History:  History  Alcohol Use No     History  Drug Use No    Social History   Social History  . Marital status: Divorced    Spouse name: N/A  . Number of children: N/A  . Years of education: N/A   Social History Main Topics  . Smoking status: Current Every Day Smoker    Packs/day: 0.75    Years: 0.00    Types: Cigarettes  . Smokeless tobacco: Never Used  . Alcohol use No  . Drug use: No  . Sexual activity: Not Asked   Other Topics Concern  . None   Social History Narrative  . None    Hospital Course:    [name] is a 70 year old male with history of bipolar illness admitted in a manic, psychotic episode in the context of medication noncompliance.  1. Mood/psychosis. We continued Seroquel and Depakote for psychosis and mood stabilization. We used Lithium briefly but it had to be discontinued due to kidney problems. VPA 54 on 100 mg of Depakote. When his dose was increased to 1500 mg, his VPA was 105 with elevated free VPA and somnolence without increase in ammonia level.   2. Hypertension. Lisinopril was discontinued due to increased risk for lithium  toxicity. Vital signs were stable.  3. Low TSH. Free T4 is normal.  4. Tobacco use disorder. Nicotine patch  is available.  5.  Metabolic syndrome monitoring. Lipid panel and hemoglobin A1care normal.  6. EKG. Normal EKG. QT 354.   7. Disposition. He will be discharged to home. He will follow-up with Dr. Lesle ChrisLatiff.  Physical Findings: AIMS: Facial and Oral Movements Muscles of Facial Expression: None, normal Lips and Perioral Area: None, normal Jaw: None, normal Tongue: None, normal,Extremity Movements Upper (arms, wrists, hands, fingers): None, normal Lower (legs, knees, ankles, toes): None, normal, Trunk Movements Neck, shoulders, hips: None, normal, Overall Severity Severity of abnormal movements (highest score from questions above): None, normal Incapacitation due to abnormal movements: None, normal Patient's awareness of abnormal movements (rate only patient's report): No Awareness, Dental Status Current problems with teeth and/or dentures?: No Does patient usually wear dentures?: No  CIWA:    COWS:     Musculoskeletal: Strength & Muscle Tone: within normal limits Gait & Station: normal Patient leans: N/A  Psychiatric Specialty Exam: Physical Exam  Nursing note and vitals reviewed.   Review of Systems  All other systems reviewed and are negative.   Blood pressure 131/88, pulse 86, temperature 97.8 F (36.6 C), temperature source Oral, resp. rate 18, height 5\' 6"  (1.676 m), weight 59.4 kg (131 lb), SpO2 100 %.Body mass index is 21.14 kg/m.  See SRA.                                                  Sleep:  Number of Hours: 7.5     Have you used any form of tobacco in the last 30 days? (Cigarettes, Smokeless Tobacco, Cigars, and/or Pipes): Yes  Has this patient used any form of tobacco in the last 30 days? (Cigarettes, Smokeless Tobacco, Cigars, and/or Pipes) Yes, Yes, A prescription for an FDA-approved tobacco cessation medication was  offered at discharge and the patient refused  Blood Alcohol level:  Lab Results  Component Value Date   ETH <5 12/01/2015    Metabolic Disorder Labs:  Lab Results  Component Value Date   HGBA1C 6.0 12/01/2015   No results found for: PROLACTIN Lab Results  Component Value Date   CHOL 191 12/01/2015   TRIG 189 (H) 12/01/2015   HDL 46 12/01/2015   CHOLHDL 4.2 12/01/2015   VLDL 38 12/01/2015   LDLCALC 107 (H) 12/01/2015   LDLCALC 124 (H) 02/22/2015    See Psychiatric Specialty Exam and Suicide Risk Assessment completed by Attending Physician prior to discharge.  Discharge destination:  Home  Is patient on multiple antipsychotic therapies at discharge:  No   Has Patient had three or more failed trials of antipsychotic monotherapy by history:  No  Recommended Plan for Multiple Antipsychotic Therapies: NA  Discharge Instructions    Diet - low sodium heart healthy    Complete by:  As directed   Increase activity slowly    Complete by:  As directed       Medication List    STOP taking these medications   divalproex 500 MG 24 hr tablet Commonly known as:  DEPAKOTE ER Replaced by:  divalproex 500 MG DR tablet   lisinopril 10 MG tablet Commonly known as:  PRINIVIL,ZESTRIL   lithium carbonate 300 MG capsule     TAKE these medications     Indication  divalproex 500 MG DR tablet Commonly known as:  DEPAKOTE Take 1 tablet (500 mg total) by mouth 2 (two) times daily. Replaces:  divalproex 500 MG 24 hr tablet  Indication:  Manic Phase of Manic-Depression   multivitamin tablet Take 1 tablet by mouth daily.  Indication:  general health   QUEtiapine 300 MG tablet Commonly known as:  SEROQUEL Take 1 tablet (300 mg total) by mouth at bedtime.  Indication:  Manic Phase of Manic-Depression      Follow-up Information    [facility] .           Follow-up recommendations:  Activity:  as tolerated. Diet:  low sodium heart healthy. Other:  keep follow up  appointments.  Comments:    Signed: Kristine Linea, MD 12/14/2015, 9:03 AM

## 2015-12-14 NOTE — [facility] Group Notes (Signed)
[facility] LCSW Group Therapy   12/14/2015 9:30 am   Type of Therapy: Group Therapy   Participation Level: Active   Participation Quality: Attentive, Sharing and Supportive   Affect: Appropriate   Cognitive: Alert and Oriented   Insight: Developing/Improving and Engaged   Engagement in Therapy: Developing/Improving and Engaged   Modes of Intervention: Clarification, Confrontation, Discussion, Education, Exploration, Limit-setting, Orientation, Problem-solving, Rapport Building, Dance movement psychotherapisteality Testing, Socialization and Support   Summary of Progress/Problems: The topic for group was balance in life. Today's group focused on defining balance in one's own words, identifying things that can knock one off balance, and exploring healthy ways to maintain balance in life. Group members were asked to provide an example of a time when they felt off balance, describe how they handled that situation, and process healthier ways to regain balance in the future. Group members were asked to share the most important tool for maintaining balance that they [facility] and how they plan to apply this method after discharge. Patient stated that balance in his life means being appreciative of nature. He stated that making sure he has things lined up for his children and grandchildren will allow him to achieve balance.  Tony AbbotKadijah Elle Fowler, MSW, LCSW-A 12/14/2015, 11:52AM

## 2015-12-14 NOTE — [facility] Suicide Risk Assessment (Signed)
[facility]   Principal Problem: Bipolar affective disorder, manic, severe, with psychotic behavior [facility](HCC) Discharge Diagnoses:  Patient Active Problem List   Diagnosis Date Noted  . Bipolar affective disorder, manic, severe, with psychotic behavior (HCC) [F31.2] 12/01/2015  . Involuntary commitment [Z04.6] 12/01/2015  . Tobacco use disorder [F17.200] 12/01/2015  . BPH (benign prostatic hyperplasia) [N40.0] 02/22/2015  . Benign hypertension [I10] 09/07/2014  . CKD (chronic kidney disease), stage III [N18.3] 09/07/2014    Total Time spent with patient: 30 minutes  Musculoskeletal: Strength & Muscle Tone: within normal limits Gait & Station: normal Patient leans: N/A  Psychiatric Specialty Exam: Review of Systems  All other systems reviewed and are negative.   Blood pressure 131/88, pulse 86, temperature 97.8 F (36.6 C), temperature source Oral, resp. rate 18, height 5\' 6"  (1.676 m), weight 59.4 kg (131 lb), SpO2 100 %.Body mass index is 21.14 kg/m.  General Appearance: Casual  Eye Contact::  Good  Speech:  Clear and Coherent409  Volume:  Normal  Mood:  Euphoric  Affect:  Appropriate  Thought Process:  Goal Directed  Orientation:  Full (Time, Place, and Person)  Thought Content:  Delusions and Paranoid Ideation  Suicidal Thoughts:  No  Homicidal Thoughts:  No  Memory:  Immediate;   Fair Recent;   Fair Remote;   Fair  Judgement:  Impaired  Insight:  Shallow  Psychomotor Activity:  Normal  Concentration:  Fair  Recall:  [employer]Fair  Fund of Knowledge:Fair  Language: Fair  Akathisia:  No  Handed:  Right  AIMS (if indicated):     Assets:  Communication Skills Desire for Improvement Financial Resources/Insurance Housing Physical Health Resilience Social Support  Sleep:  Number of Hours: 7.5  Cognition: WNL  ADL's:  Intact   Mental Status Per Nursing Assessment::   On Admission:     Demographic Factors:  Male, Age 70 or older and  Caucasian  Loss Factors: Financial problems/change in socioeconomic status  Historical Factors: Impulsivity  Risk Reduction Factors:   Sense of responsibility to family, Living with another person, especially a relative, Positive social support and Positive therapeutic relationship  Continued Clinical Symptoms:  Bipolar Disorder:   Mixed State  Cognitive Features That Contribute To Risk:  None    Suicide Risk:  Minimal: No identifiable suicidal ideation.  Patients presenting with no risk factors but with morbid ruminations; may be classified as minimal risk based on the severity of the depressive symptoms  Follow-up Information    [facility] .           Plan Of Care/Follow-up recommendations:  Activity:  as tolerated. Diet:  low sodium heart healthy. Other:  keep follow up appointments.  Kristine LineaJolanta Retina Bernardy, MD 12/14/2015, 9:03 AM

## 2015-12-14 NOTE — Progress Notes (Signed)
D: Pt is observed in the milieu this evening. When asked how his day was he states "the same as every day." He reports that his goal was "to seek justice." Pt denies SI/HI/AVH at this time, "except in self defense." He takes medications as prescribed. A: Emotional support and encouragement provided. Medications administered with education. q15 minute safety checks maintained. R: Pt remains free from harm. Will continue to monitor.

## 2015-12-14 NOTE — Tx Team (Signed)
Interdisciplinary Treatment and Diagnostic Plan Update  12/14/2015 Time of Session: 10:30am Wadell Craddock MRN: [id number]  Principal Diagnosis: Bipolar affective disorder, manic, severe, with psychotic behavior (HCC)  Secondary Diagnoses: Principal Problem:   Bipolar affective disorder, manic, severe, with psychotic behavior (HCC) Active Problems:   Benign hypertension   CKD (chronic kidney disease), stage III   Involuntary commitment   Tobacco use disorder   Current Medications:  Current Facility-Administered Medications  Medication Dose Route Frequency Provider Last Rate Last Dose  . acetaminophen (TYLENOL) tablet 650 mg  650 mg Oral Q6H PRN Audery Amel, MD      . alum & mag hydroxide-simeth (MAALOX/MYLANTA) 200-200-20 MG/5ML suspension 30 mL  30 mL Oral Q4H PRN Audery Amel, MD      . divalproex (DEPAKOTE) DR tablet 500 mg  500 mg Oral BID Darliss Ridgel, MD   500 mg at 12/14/15 0823  . magnesium hydroxide (MILK OF MAGNESIA) suspension 30 mL  30 mL Oral Daily PRN Audery Amel, MD      . nicotine (NICODERM CQ - dosed in mg/24 hours) patch 21 mg  21 mg Transdermal Daily Jimmy Footman, MD   21 mg at 12/14/15 0824  . QUEtiapine (SEROQUEL) tablet 300 mg  300 mg Oral QHS Shari Prows, MD   300 mg at 12/13/15 2129   PTA Medications: Prescriptions Prior to Admission  Medication Sig Dispense Refill Last Dose  . divalproex (DEPAKOTE ER) 500 MG 24 hr tablet Take 500 mg by mouth 2 (two) times daily.   unknown at unknown  . lisinopril (PRINIVIL,ZESTRIL) 10 MG tablet Take 1 tablet (10 mg total) by mouth daily. 90 tablet 4 unknown at unknown  . lithium carbonate 300 MG capsule Take 300 mg by mouth 2 (two) times daily with a meal.   Not Taking at Unknown time  . Multiple Vitamin (MULTIVITAMIN) tablet Take 1 tablet by mouth daily.   unknown at unknown  . [DISCONTINUED] QUEtiapine (SEROQUEL) 300 MG tablet Take 1 tablet (300 mg total) by mouth at bedtime. 30 tablet 2  unknown at unknown    Treatment Modalities: Medication Management, Group therapy, Case management,  1 to 1 session with clinician, Psychoeducation, Recreational therapy.   Physician Treatment Plan for Primary Diagnosis: Bipolar affective disorder, manic, severe, with psychotic behavior (HCC) Long Term Goal(s): Improvement in symptoms so as ready for discharge   Short Term Goals: Ability to demonstrate self-control will improve, Ability to identify and develop effective coping behaviors will improve, Ability to maintain clinical measurements within normal limits will improve and Compliance with prescribed medications will improve  Medication Management: Evaluate patient's response, side effects, and tolerance of medication regimen.  Therapeutic Interventions: 1 to 1 sessions, Unit Group sessions and Medication administration.  Evaluation of Outcomes: Adequate for Discharge  Physician Treatment Plan for Secondary Diagnosis: Principal Problem:   Bipolar affective disorder, manic, severe, with psychotic behavior (HCC) Active Problems:   Benign hypertension   CKD (chronic kidney disease), stage III   Involuntary commitment   Tobacco use disorder  Long Term Goal(s): Improvement in symptoms so as ready for discharge  Short Term Goals: Ability to demonstrate self-control will improve, Ability to identify and develop effective coping behaviors will improve, Ability to maintain clinical measurements within normal limits will improve and Compliance with prescribed medications will improve  Medication Management: Evaluate patient's response, side effects, and tolerance of medication regimen.  Therapeutic Interventions: 1 to 1 sessions, Unit Group sessions and Medication administration.  Evaluation  of Outcomes: Adequate for Discharge   RN Treatment Plan for Primary Diagnosis: Bipolar affective disorder, manic, severe, with psychotic behavior (HCC) Long Term Goal(s): Knowledge of disease and  therapeutic regimen to maintain health will improve  Short Term Goals: Ability to verbalize frustration and anger appropriately will improve, Ability to demonstrate self-control and Compliance with prescribed medications will improve  Medication Management: RN will administer medications as ordered by provider, will assess and evaluate patient's response and provide education to patient for prescribed medication. RN will report any adverse and/or side effects to prescribing provider.  Therapeutic Interventions: 1 on 1 counseling sessions, Psychoeducation, Medication administration, Evaluate responses to treatment, Monitor vital signs and CBGs as ordered, Perform/monitor CIWA, COWS, AIMS and Fall Risk screenings as ordered, Perform wound care treatments as ordered.  Evaluation of Outcomes: Adequate for Discharge   LCSW Treatment Plan for Primary Diagnosis: Bipolar affective disorder, manic, severe, with psychotic behavior (HCC) Long Term Goal(s): Safe transition to appropriate next level of care at discharge, Engage patient in therapeutic group addressing interpersonal concerns.  Short Term Goals: Engage patient in aftercare planning with referrals and resources, Increase social support, Increase ability to appropriately verbalize feelings, Increase emotional regulation, Facilitate acceptance of mental health diagnosis and concerns and Increase skills for wellness and recovery  Therapeutic Interventions: Assess for all discharge needs, 1 to 1 time with Social worker, Explore available resources and support systems, Assess for adequacy in community support network, Educate family and significant other(s) on suicide prevention, Complete Psychosocial Assessment, Interpersonal group therapy.  Evaluation of Outcomes: Adequate for Discharge   Progress in Treatment: Attending groups: Yes. Participating in groups: Yes. Taking medication as prescribed: Yes. Toleration medication:  Yes. Family/Significant other contact made: No, Patient refused to have family contacted.  Patient understands diagnosis: Yes. Discussing patient identified problems/goals with staff: Yes. Medical problems stabilized or resolved: Yes. Denies suicidal/homicidal ideation: Yes. Issues/concerns per patient self-inventory: No. Other: n/a  New problem(s) identified: None identified at this time.  New Short Term/Long Term Goal(s): None identified at this time.   Discharge Plan or Barriers: Patient will return home to live with his daughters and have scheduled follow-up for 12/26/2015 at 2pm with [facility].   Reason for Continuation of Hospitalization: Anticipated discharge date 12/14/2015  Estimated Length of Stay: Anticipated discharge date 12/14/2015  Attendees: Patient: Tony CassisHerbert Fowler 12/14/2015 11:42 AM  Physician: Dr. Jennet MaduroPucilowska, MD 12/14/2015 11:42 AM  Nursing: Shelia MediaJanet Jones, RN 12/14/2015 11:42 AM  RN Care Manager: 12/14/2015 11:42 AM  Social Worker: Fredrich BirksAmaris G. Garnette CzechSampson MSW, LCSWA 12/14/2015 11:42 AM  Recreational Therapist:  12/14/2015 11:42 AM  Other:  12/14/2015 11:42 AM  Other:  12/14/2015 11:42 AM  Other: 12/14/2015 11:42 AM    Scribe for Treatment Team: Arelia LongestAmaris G Carlos Quackenbush, LCSWA 12/14/2015 11:51 AM

## 2015-12-14 NOTE — Progress Notes (Signed)
  [facility] :  Will you be returning to the same living situation after discharge:  Yes,  daughter At discharge, do you have transportation home?: Yes,  daughter Do you have the ability to pay for your medications: Yes,  Patient has insurance  Release of information consent forms completed and in the chart;  Patient's signature needed at discharge.  Patient to Follow up at: Follow-up Information    [facility] Follow up on 12/26/2015.   Why:  You have [facility] with [facility] on 12/26/2015 at 2pm. Please bring your discharge summary with you to this appointment.  Contact information: [address] [place], [place] [address] Phone:[phone] or [phone] [phone]          Next level of care provider has [facility] to [facility] Link:no  Safety Planning and Suicide Prevention discussed: No. Patient refused to have CSW contact family.   Have you used any form of tobacco in the last 30 days? (Cigarettes, Smokeless Tobacco, Cigars, and/or Pipes): Yes  Has patient been referred to the Quitline?: Patient refused referral  Patient has been referred for addiction treatment: N/A  Tony Fowler G. Garnette CzechSampson MSW, LCSWA 12/14/2015, 11:54 AM

## 2015-12-14 NOTE — Progress Notes (Signed)
Patient discharged home. DC instructions provided and explained. Medications reviewed. Rx given. All questions answered. Pt stable at discharge. Denies SI, HI, AVH. Belongings returned. 

## 2015-12-14 NOTE — [facility] Group Notes (Signed)
Goals Group  Date/Time: 9:00AM Type of Therapy and Topic: Group Therapy: Goals Group: SMART Goals  ?  Participation Level: Moderate  ?  Description of Group:  ?  The purpose of a daily goals group is to assist and guide patients in setting recovery/wellness-related goals. The objective is to set goals as they relate to the crisis in which they were admitted. Patients will be using SMART goal modalities to set measurable goals. Characteristics of realistic goals will be discussed and patients will be assisted in setting and processing how one will reach their goal. Facilitator will also assist patients in applying interventions and coping skills learned in psycho-education groups to the SMART goal and process how one will achieve defined goal.  ?  Therapeutic Goals:  ?  -Patients will develop and document one goal related to or their crisis in which brought them into treatment.  -Patients will be guided by LCSW using SMART goal setting modality in how to set a measurable, attainable, realistic and time sensitive goal.  -Patients will process barriers in reaching goal.  -Patients will process interventions in how to overcome and successful in reaching goal.  ?  Patient's Goal: Patient stated that his goal for today is to go home and regulate his medications. In order to accomplish this goal patient stated that he will stop taking medications that are no longer working and figure out what medications work best for him. ?  Therapeutic Modalities:  Motivational Interviewing  Engineer, manufacturing systemsCognitive Behavioral Therapy  Crisis Intervention Model  SMART goals setting  [name] AbbotKadijah Leroy Trim, MSW, LCSW-A

## 2015-12-26 DIAGNOSIS — F3113 Bipolar disorder, current episode manic without psychotic features, severe: Secondary | ICD-10-CM | POA: Diagnosis not present

## 2016-01-10 DIAGNOSIS — F3113 Bipolar disorder, current episode manic without psychotic features, severe: Secondary | ICD-10-CM | POA: Diagnosis not present

## 2016-02-15 DIAGNOSIS — F3113 Bipolar disorder, current episode manic without psychotic features, severe: Secondary | ICD-10-CM | POA: Diagnosis not present

## 2016-03-08 DIAGNOSIS — F3113 Bipolar disorder, current episode manic without psychotic features, severe: Secondary | ICD-10-CM | POA: Diagnosis not present

## 2016-03-13 ENCOUNTER — Encounter: Payer: Medicare Other | Admitting: Family Medicine

## 2016-04-20 ENCOUNTER — Encounter: Payer: Self-pay | Admitting: Emergency Medicine

## 2016-04-20 ENCOUNTER — Emergency Department
Admission: EM | Admit: 2016-04-20 | Discharge: 2016-04-20 | Disposition: A | Discharge status: Home / Self Care | Payer: Medicare Other | Attending: Emergency Medicine | Admitting: Emergency Medicine

## 2016-04-20 ENCOUNTER — Emergency Department: Payer: Medicare Other

## 2016-04-20 DIAGNOSIS — R410 Disorientation, unspecified: Secondary | ICD-10-CM | POA: Insufficient documentation

## 2016-04-20 DIAGNOSIS — R531 Weakness: Secondary | ICD-10-CM | POA: Diagnosis present

## 2016-04-20 DIAGNOSIS — J09X2 Influenza due to identified novel influenza A virus with other respiratory manifestations: Secondary | ICD-10-CM | POA: Diagnosis not present

## 2016-04-20 DIAGNOSIS — Z79899 Other long term (current) drug therapy: Secondary | ICD-10-CM | POA: Diagnosis not present

## 2016-04-20 DIAGNOSIS — N183 Chronic kidney disease, stage 3 (moderate): Secondary | ICD-10-CM | POA: Diagnosis not present

## 2016-04-20 DIAGNOSIS — Y9289 Other specified places as the place of occurrence of the external cause: Secondary | ICD-10-CM | POA: Insufficient documentation

## 2016-04-20 DIAGNOSIS — F17210 Nicotine dependence, cigarettes, uncomplicated: Secondary | ICD-10-CM | POA: Diagnosis not present

## 2016-04-20 DIAGNOSIS — S299XXA Unspecified injury of thorax, initial encounter: Secondary | ICD-10-CM | POA: Diagnosis not present

## 2016-04-20 DIAGNOSIS — W1809XA Striking against other object with subsequent fall, initial encounter: Secondary | ICD-10-CM | POA: Diagnosis not present

## 2016-04-20 DIAGNOSIS — Y999 Unspecified external cause status: Secondary | ICD-10-CM | POA: Diagnosis not present

## 2016-04-20 DIAGNOSIS — Y939 Activity, unspecified: Secondary | ICD-10-CM | POA: Diagnosis not present

## 2016-04-20 DIAGNOSIS — J101 Influenza due to other identified influenza virus with other respiratory manifestations: Secondary | ICD-10-CM

## 2016-04-20 LAB — COMPREHENSIVE METABOLIC PANEL
ALT: 39 U/L (ref 17–63)
ANION GAP: 6 (ref 5–15)
AST: 55 U/L — ABNORMAL HIGH (ref 15–41)
Albumin: 3.2 g/dL — ABNORMAL LOW (ref 3.5–5.0)
Alkaline Phosphatase: 55 U/L (ref 38–126)
BILIRUBIN TOTAL: 0.6 mg/dL (ref 0.3–1.2)
BUN: 18 mg/dL (ref 6–20)
CO2: 24 mmol/L (ref 22–32)
Calcium: 9.5 mg/dL (ref 8.9–10.3)
Chloride: 107 mmol/L (ref 101–111)
Creatinine, Ser: 1.15 mg/dL (ref 0.61–1.24)
Glucose, Bld: 100 mg/dL — ABNORMAL HIGH (ref 65–99)
POTASSIUM: 4.2 mmol/L (ref 3.5–5.1)
Sodium: 137 mmol/L (ref 135–145)
TOTAL PROTEIN: 6.5 g/dL (ref 6.5–8.1)

## 2016-04-20 LAB — URINALYSIS, COMPLETE (UACMP) WITH MICROSCOPIC
BACTERIA UA: NONE SEEN
BILIRUBIN URINE: NEGATIVE
Glucose, UA: NEGATIVE mg/dL
HGB URINE DIPSTICK: NEGATIVE
Ketones, ur: 5 mg/dL — AB
LEUKOCYTES UA: NEGATIVE
NITRITE: NEGATIVE
Protein, ur: NEGATIVE mg/dL
SPECIFIC GRAVITY, URINE: 1.013 (ref 1.005–1.030)
Squamous Epithelial / LPF: NONE SEEN
WBC, UA: NONE SEEN WBC/hpf (ref 0–5)
pH: 7 (ref 5.0–8.0)

## 2016-04-20 LAB — INFLUENZA PANEL BY PCR (TYPE A & B)
INFLAPCR: POSITIVE — AB
Influenza B By PCR: NEGATIVE

## 2016-04-20 LAB — VALPROIC ACID LEVEL: Valproic Acid Lvl: 94 ug/mL (ref 50.0–100.0)

## 2016-04-20 LAB — CBC
HEMATOCRIT: 41.1 % (ref 40.0–52.0)
Hemoglobin: 14 g/dL (ref 13.0–18.0)
MCH: 32.4 pg (ref 26.0–34.0)
MCHC: 34.1 g/dL (ref 32.0–36.0)
MCV: 94.9 fL (ref 80.0–100.0)
Platelets: 128 10*3/uL — ABNORMAL LOW (ref 150–440)
RBC: 4.34 MIL/uL — AB (ref 4.40–5.90)
RDW: 16.1 % — AB (ref 11.5–14.5)
WBC: 6.1 10*3/uL (ref 3.8–10.6)

## 2016-04-20 MED ORDER — ONDANSETRON 4 MG PO TBDP: 4.0000 mg | ORAL_TABLET | Freq: Three times a day (TID) | ORAL | 0 refills | Status: DC | PRN

## 2016-04-20 MED ORDER — ACETAMINOPHEN 325 MG PO TABS
650.0000 mg | ORAL_TABLET | Freq: Once | ORAL | Status: AC
  Administered 2016-04-20: 650 mg via ORAL
  Filled 2016-04-20: qty 2

## 2016-04-20 MED ORDER — SODIUM CHLORIDE 0.9 % IV BOLUS (SEPSIS)
1000.0000 mL | Freq: Once | INTRAVENOUS | Status: AC
  Administered 2016-04-20: 1000 mL via INTRAVENOUS

## 2016-04-20 MED ORDER — ACETAMINOPHEN 325 MG PO TABS: 650.0000 mg | ORAL_TABLET | Freq: Four times a day (QID) | ORAL | 0 refills | Status: DC | PRN

## 2016-04-20 NOTE — ED Provider Notes (Signed)
[facility] Emergency Department Provider Note  ____________________________________________  Time seen: Approximately 1:26 PM  I have reviewed the triage vital signs and the nursing notes.   HISTORY  Chief Complaint Fall and Altered Mental Status    HPI Tony Fowler is a 71 y.o. male brought to the ED by family due to a fall today. The patient fell once about a week ago and then again today. He seems to have generalized weakness. Other complaints. Patient is only taking risperidone and Depakote currently. Eating and drinking normally. Denies pain anywhere. No syncope or head injury. No seizures or changes in balance or coordination. Daughter at the bedside does feel that the patient seemed more confused recently.  Patient has been on Depakote for 4 or 5 months. He had to be switched to Depakote and transitioned off of lithium due to renal toxicity.     Past Medical History:  Diagnosis Date  . Bipolar 1 disorder [facility](HCC)      Patient Active Problem List   Diagnosis Date Noted  . Bipolar affective disorder, manic, severe, with psychotic behavior (HCC) 12/01/2015  . Involuntary commitment 12/01/2015  . Tobacco use disorder 12/01/2015  . BPH (benign prostatic hyperplasia) 02/22/2015  . Benign hypertension 09/07/2014  . CKD (chronic kidney disease), stage III 09/07/2014     Past Surgical History:  Procedure Laterality Date  . TONSILECTOMY/ADENOIDECTOMY WITH MYRINGOTOMY       Prior to Admission medications   Medication Sig Start Date End Date Taking? Authorizing Provider  divalproex (DEPAKOTE) 500 MG DR tablet Take 1 tablet (500 mg total) by mouth 2 (two) times daily. 12/13/15  Yes Shari ProwsJolanta B Pucilowska, MD  Multiple Vitamin (MULTIVITAMIN) tablet Take 1 tablet by mouth daily.   Yes Historical Provider, MD  risperiDONE (RISPERDAL) 3 MG tablet Take 3 mg by mouth at bedtime.   Yes Historical Provider, MD  acetaminophen (TYLENOL) 325 MG tablet Take 2  tablets (650 mg total) by mouth every 6 (six) hours as needed. 04/20/16   Sharman CheekPhillip Dannell Gortney, MD  ondansetron (ZOFRAN ODT) 4 MG disintegrating tablet Take 1 tablet (4 mg total) by mouth every 8 (eight) hours as needed for nausea or vomiting. 04/20/16   Sharman CheekPhillip Jimmi Sidener, MD     Allergies Penicillins   Family History  Problem Relation Age of Onset  . Heart disease Mother   . Hypertension Mother   . Mental illness Mother   . Heart disease Brother   . Hypertension Brother     Social History Social History  Substance Use Topics  . Smoking status: Current Every Day Smoker    Packs/day: 0.75    Years: 0.00    Types: Cigarettes  . Smokeless tobacco: Never Used  . Alcohol use No    Review of Systems  Constitutional:   No fever or chills.  ENT:   No sore throat. No rhinorrhea. Cardiovascular:   No chest pain. Respiratory:   No dyspnea or cough. Gastrointestinal:   Negative for abdominal pain, vomiting and diarrhea.  Genitourinary:   Negative for dysuria or difficulty urinating. Musculoskeletal:   Negative for focal pain or swelling Neurological:   Negative for headaches 10-point ROS otherwise negative.  ____________________________________________   PHYSICAL EXAM:  VITAL SIGNS: ED Triage Vitals [04/20/16 1209]  Enc Vitals Group     BP (!) 161/92     Pulse Rate 87     Resp 18     Temp (!) 100.5 F (38.1 C)     Temp Source Oral  SpO2 95 %     Weight 137 lb (62.1 kg)     Height 5\' 6"  (1.676 m)     Head Circumference      Peak Flow      Pain Score 5     Pain Loc      Pain Edu?      Excl. in GC?     Vital signs reviewed, nursing assessments reviewed.   Constitutional:   Alert and oriented.Ill-appearing but not in distress Eyes:   No scleral icterus. No conjunctival pallor. PERRL. EOMI.  No nystagmus. ENT   Head:   Normocephalic and atraumatic.   Nose:   No congestion/rhinnorhea. No septal hematoma   Mouth/Throat:   MMM, no pharyngeal erythema. No  peritonsillar mass.    Neck:   No stridor. No SubQ emphysema. No meningismus. Hematological/Lymphatic/Immunilogical:   No cervical lymphadenopathy. Cardiovascular:   RRR. Symmetric bilateral radial and DP pulses.  No murmurs.  Respiratory:   Normal respiratory effort without tachypnea nor retractions. Breath sounds are clear and equal bilaterally. No wheezes/rales/rhonchi. Gastrointestinal:   Soft and nontender. Non distended. There is no CVA tenderness.  No rebound, rigidity, or guarding. Genitourinary:   deferred Musculoskeletal:   Nontender with normal range of motion in all extremities. No joint effusions.  No lower extremity tenderness.  No edema. Neurologic:   Normal speech and language.  CN 2-10 normal. Motor grossly intact. No gross focal neurologic deficits are appreciated.  Skin:    Skin is warm, dry and intact. No rash noted.  No petechiae, purpura, or bullae.  ____________________________________________    LABS (pertinent positives/negatives) (all labs ordered are listed, but only abnormal results are displayed) Labs Reviewed  COMPREHENSIVE METABOLIC PANEL - Abnormal; Notable for the following:       Result Value   Glucose, Bld 100 (*)    Albumin 3.2 (*)    AST 55 (*)    All other components within normal limits  CBC - Abnormal; Notable for the following:    RBC 4.34 (*)    RDW 16.1 (*)    Platelets 128 (*)    All other components within normal limits  URINALYSIS, COMPLETE (UACMP) WITH MICROSCOPIC - Abnormal; Notable for the following:    Color, Urine YELLOW (*)    APPearance CLEAR (*)    Ketones, ur 5 (*)    All other components within normal limits  INFLUENZA PANEL BY PCR (TYPE A & B, H1N1) - Abnormal; Notable for the following:    Influenza A By PCR POSITIVE (*)    All other components within normal limits  VALPROIC ACID LEVEL  CBG MONITORING, ED   ____________________________________________   EKG  Interpreted by me Sinus rhythm rate of 84, normal  axis intervals QRS ST segments and T waves.  ____________________________________________    RADIOLOGY  Chest x-ray unremarkable  ____________________________________________   PROCEDURES Procedures  ____________________________________________   INITIAL IMPRESSION / ASSESSMENT AND PLAN / ED COURSE  Pertinent labs & imaging results that were available during my care of the patient were reviewed by me and considered in my medical decision making (see chart for details).  Patient does appear somewhat ill but not in distress. Nontoxic. Labs and vital signs all unremarkable except for a low-grade fever of 100.5. Influenza PCR is positive for influenza type a. Depakote is therapeutic. Other labs are unremarkable. We'll continue to treat the patient symptomatically with Tylenol and antiemetics and encourage aggressive oral hydration over the next couple of days. Suitable  for outpatient follow-up. Return precautions given.     Clinical Course    ____________________________________________   FINAL CLINICAL IMPRESSION(S) / ED DIAGNOSES  Final diagnoses:  Influenza due to influenza virus, type A, human      New Prescriptions   ACETAMINOPHEN (TYLENOL) 325 MG TABLET    Take 2 tablets (650 mg total) by mouth every 6 (six) hours as needed.   ONDANSETRON (ZOFRAN ODT) 4 MG DISINTEGRATING TABLET    Take 1 tablet (4 mg total) by mouth every 8 (eight) hours as needed for nausea or vomiting.     Portions of this note were generated with dragon dictation software. Dictation errors may occur despite best attempts at proofreading.    Sharman Cheek, MD 04/20/16 1359

## 2016-04-20 NOTE — ED Triage Notes (Signed)
Patient brought to the ED via POV from home, patient daughter states patient had a fall this morning. Patient told his daughter that he hit his right side on the mini refrigerator that is in his room and that he felt confused Patient also fell on Thursday.   Patient was noted to have temperature on arrival to ED. Patient has been exposed to the flu.

## 2016-05-01 DIAGNOSIS — F3113 Bipolar disorder, current episode manic without psychotic features, severe: Secondary | ICD-10-CM | POA: Diagnosis not present

## 2016-05-16 ENCOUNTER — Encounter: Payer: Self-pay | Admitting: Family Medicine

## 2016-05-16 ENCOUNTER — Ambulatory Visit (INDEPENDENT_AMBULATORY_CARE_PROVIDER_SITE_OTHER): Payer: Medicare Other | Admitting: Family Medicine

## 2016-05-16 VITALS — BP 130/86 | HR 106 | Ht 66.14 in | Wt 123.8 lb

## 2016-05-16 DIAGNOSIS — Z1211 Encounter for screening for malignant neoplasm of colon: Secondary | ICD-10-CM | POA: Diagnosis not present

## 2016-05-16 DIAGNOSIS — Z1159 Encounter for screening for other viral diseases: Secondary | ICD-10-CM

## 2016-05-16 DIAGNOSIS — N401 Enlarged prostate with lower urinary tract symptoms: Secondary | ICD-10-CM

## 2016-05-16 DIAGNOSIS — N183 Chronic kidney disease, stage 3 unspecified: Secondary | ICD-10-CM

## 2016-05-16 DIAGNOSIS — Z125 Encounter for screening for malignant neoplasm of prostate: Secondary | ICD-10-CM

## 2016-05-16 DIAGNOSIS — R251 Tremor, unspecified: Secondary | ICD-10-CM | POA: Diagnosis not present

## 2016-05-16 DIAGNOSIS — G251 Drug-induced tremor: Secondary | ICD-10-CM | POA: Insufficient documentation

## 2016-05-16 DIAGNOSIS — K4090 Unilateral inguinal hernia, without obstruction or gangrene, not specified as recurrent: Secondary | ICD-10-CM | POA: Insufficient documentation

## 2016-05-16 DIAGNOSIS — Z13220 Encounter for screening for lipoid disorders: Secondary | ICD-10-CM | POA: Diagnosis not present

## 2016-05-16 DIAGNOSIS — Z0000 Encounter for general adult medical examination without abnormal findings: Secondary | ICD-10-CM | POA: Diagnosis not present

## 2016-05-16 DIAGNOSIS — Z23 Encounter for immunization: Secondary | ICD-10-CM

## 2016-05-16 DIAGNOSIS — K2100 Gastro-esophageal reflux disease with esophagitis, without bleeding: Secondary | ICD-10-CM

## 2016-05-16 DIAGNOSIS — N138 Other obstructive and reflux uropathy: Secondary | ICD-10-CM

## 2016-05-16 DIAGNOSIS — F312 Bipolar disorder, current episode manic severe with psychotic features: Secondary | ICD-10-CM

## 2016-05-16 DIAGNOSIS — I10 Essential (primary) hypertension: Secondary | ICD-10-CM

## 2016-05-16 DIAGNOSIS — K219 Gastro-esophageal reflux disease without esophagitis: Secondary | ICD-10-CM | POA: Insufficient documentation

## 2016-05-16 DIAGNOSIS — R634 Abnormal weight loss: Secondary | ICD-10-CM

## 2016-05-16 DIAGNOSIS — Z1329 Encounter for screening for other suspected endocrine disorder: Secondary | ICD-10-CM

## 2016-05-16 MED ORDER — TAMSULOSIN HCL 0.4 MG PO CAPS
0.4000 mg | ORAL_CAPSULE | Freq: Every day | ORAL | 6 refills | Status: DC
Start: 2016-05-16 — End: 2016-07-02

## 2016-05-16 MED ORDER — OMEPRAZOLE 40 MG PO CPDR: 40.0000 mg | DELAYED_RELEASE_CAPSULE | Freq: Every day | ORAL | 3 refills | Status: DC

## 2016-05-16 NOTE — Assessment & Plan Note (Addendum)
Massively enlarged right inguinal hernia Will leave alone at patient's request

## 2016-05-16 NOTE — Assessment & Plan Note (Signed)
Discussed BPH urinary retention will do trial of tamsulosin will probably need urology referral.

## 2016-05-16 NOTE — Assessment & Plan Note (Signed)
Marked tremor at rest with difficulty eating will refer to neurology to further evaluate

## 2016-05-16 NOTE — Assessment & Plan Note (Signed)
With dysphasia wants to try Prilosec prior to GI referral.

## 2016-05-16 NOTE — Assessment & Plan Note (Signed)
Followed by psychiatry and worsening off lithium

## 2016-05-16 NOTE — Assessment & Plan Note (Signed)
Multifactor weight loss with probable malnutrition hopefully will get improved with improvement of other conditions will need to continue observing discussed diet nutrition with patient and daughters.

## 2016-05-16 NOTE — Progress Notes (Signed)
BP 130/86 (BP Location: Left Arm)   Pulse (!) 106   Ht 5' 6.14" (1.68 m)   Wt 123 lb 12.8 oz (56.2 kg)   SpO2 95%   BMI 19.90 kg/m    Subjective:    Patient ID: Tony Fowler, male    DOB: 1945/08/30, 71 y.o.   MRN: [id number]  HPI: Tony Fowler is a 71 y.o. male  Chief Complaint  Patient presents with  . Annual Exam  AWV metrics met  Patient accompanied by his daughters who assists with history patient with multiple issues going on. These issues seem to start primarily after patient's renal function declined presumably secondary to lithium. His lithium was stopped and changed to Depakote and he has had problems ever since.  One of his worst issues is bladder control issues is starting to wear a adult diaper due to marked fluid restriction to avoid involuntary urination. His only able to void for about 4 seconds. These Bladder control issues have been getting worse all fall.  Tremor worse with marked shaking of his hands difficulty eating because of this and difficulty with balance and standing. Patient used to walk multiple miles every day and is limited to staying in his house because of falling.  Some difficulty swallowing  especially with dry mouth from fluid restriction patient having primarily difficulty with meat first solids. Seems to have to get me down with the swallow of water. Hasn't tried other medications.  Psychiatric issues depression seems to be getting worse followed by psychiatrist patient's wondering about going back on lithium for quality-of-life issues with less concerned about kidney issues.   Patient was in the emergency room several weeks ago with blood work which is reviewed with CBC urinalysis and CMP essentially normal. Patient's recovered from the flu wants to get a flu shot.  With weight-loss. Lisinopril blood pressure is been okay. Is wondering if he needs to go back on his medication.    Relevant past medical, surgical, family and social  history reviewed and updated as indicated. Interim medical history since our last visit reviewed. Allergies and medications reviewed and updated.  Other than noted above Review of Systems  Constitutional: Positive for appetite change, fatigue and unexpected weight change. Negative for diaphoresis and fever.  HENT: Negative.   Eyes: Negative.   Respiratory: Negative.   Cardiovascular: Negative.   Gastrointestinal: Negative.   Endocrine: Negative.   Musculoskeletal: Negative.   Allergic/Immunologic: Negative.   Neurological: Positive for tremors and weakness.  Hematological: Negative.   Psychiatric/Behavioral: The patient is nervous/anxious.     Per HPI unless specifically indicated above     Objective:    BP 130/86 (BP Location: Left Arm)   Pulse (!) 106   Ht 5' 6.14" (1.68 m)   Wt 123 lb 12.8 oz (56.2 kg)   SpO2 95%   BMI 19.90 kg/m   Wt Readings from Last 3 Encounters:  05/16/16 123 lb 12.8 oz (56.2 kg)  04/20/16 137 lb (62.1 kg)  12/01/15 133 lb (60.3 kg)    Physical Exam  Constitutional: He is oriented to person, place, and time. He appears well-developed.  HENT:  Head: Normocephalic.  Right Ear: External ear normal.  Left Ear: External ear normal.  Nose: Nose normal.  Eyes: Conjunctivae and EOM are normal. Pupils are equal, round, and reactive to light.  Neck: Normal range of motion. Neck supple. No thyromegaly present.  Cardiovascular: Normal rate, regular rhythm, normal heart sounds and intact distal pulses.   Pulmonary/Chest:  Effort normal and breath sounds normal.  Abdominal: Soft. Bowel sounds are normal. He exhibits distension. He exhibits no mass. There is no splenomegaly or hepatomegaly. There is no tenderness. There is no rebound and no guarding.  Genitourinary: Penis normal.  Genitourinary Comments: Large right inguinal hernia Large prostate  Musculoskeletal: Normal range of motion.  Marked tremor at rest  Lymphadenopathy:    He has no cervical  adenopathy.  Neurological: He is alert and oriented to person, place, and time. He has normal reflexes.  Skin: Skin is warm and dry. He is not diaphoretic.  Psychiatric: He has a normal mood and affect. His behavior is normal. Judgment and thought content normal.    Results for orders placed or performed during the hospital encounter of 04/20/16  Comprehensive metabolic panel  Result Value Ref Range   Sodium 137 135 - 145 mmol/L   Potassium 4.2 3.5 - 5.1 mmol/L   Chloride 107 101 - 111 mmol/L   CO2 24 22 - 32 mmol/L   Glucose, Bld 100 (H) 65 - 99 mg/dL   BUN 18 6 - 20 mg/dL   Creatinine, Ser 1.15 0.61 - 1.24 mg/dL   Calcium 9.5 8.9 - 10.3 mg/dL   Total Protein 6.5 6.5 - 8.1 g/dL   Albumin 3.2 (L) 3.5 - 5.0 g/dL   AST 55 (H) 15 - 41 U/L   ALT 39 17 - 63 U/L   Alkaline Phosphatase 55 38 - 126 U/L   Total Bilirubin 0.6 0.3 - 1.2 mg/dL   GFR calc non Af Amer >60 >60 mL/min   GFR calc Af Amer >60 >60 mL/min   Anion gap 6 5 - 15  CBC  Result Value Ref Range   WBC 6.1 3.8 - 10.6 K/uL   RBC 4.34 (L) 4.40 - 5.90 MIL/uL   Hemoglobin 14.0 13.0 - 18.0 g/dL   HCT 41.1 40.0 - 52.0 %   MCV 94.9 80.0 - 100.0 fL   MCH 32.4 26.0 - 34.0 pg   MCHC 34.1 32.0 - 36.0 g/dL   RDW 16.1 (H) 11.5 - 14.5 %   Platelets 128 (L) 150 - 440 K/uL  Urinalysis, Complete w Microscopic  Result Value Ref Range   Color, Urine YELLOW (A) YELLOW   APPearance CLEAR (A) CLEAR   Specific Gravity, Urine 1.013 1.005 - 1.030   pH 7.0 5.0 - 8.0   Glucose, UA NEGATIVE NEGATIVE mg/dL   Hgb urine dipstick NEGATIVE NEGATIVE   Bilirubin Urine NEGATIVE NEGATIVE   Ketones, ur 5 (A) NEGATIVE mg/dL   Protein, ur NEGATIVE NEGATIVE mg/dL   Nitrite NEGATIVE NEGATIVE   Leukocytes, UA NEGATIVE NEGATIVE   RBC / HPF 6-30 0 - 5 RBC/hpf   WBC, UA NONE SEEN 0 - 5 WBC/hpf   Bacteria, UA NONE SEEN NONE SEEN   Squamous Epithelial / LPF NONE SEEN NONE SEEN   Mucous PRESENT   Influenza panel by PCR (type A & B, H1N1)  Result Value  Ref Range   Influenza A By PCR POSITIVE (A) NEGATIVE   Influenza B By PCR NEGATIVE NEGATIVE  Valproic acid level  Result Value Ref Range   Valproic Acid Lvl 94 50.0 - 100.0 ug/mL      Assessment & Plan:   Problem List Items Addressed This Visit      Cardiovascular and Mediastinum   Benign hypertension (Chronic)    Stable off medications for now      Relevant Orders   Lipid panel  TSH     Digestive   Reflux esophagitis    With dysphasia wants to try Prilosec prior to GI referral.      Relevant Medications   omeprazole (PRILOSEC) 40 MG capsule     Genitourinary   RESOLVED: CKD (chronic kidney disease), stage III   Relevant Orders   Lipid panel   TSH   BPH with obstruction/lower urinary tract symptoms    Discussed BPH urinary retention will do trial of tamsulosin will probably need urology referral.      Relevant Medications   tamsulosin (FLOMAX) 0.4 MG CAPS capsule   Other Relevant Orders   PSA     Other   Bipolar affective disorder, manic, severe, with psychotic behavior ([place])    Followed by psychiatry and worsening off lithium      Relevant Orders   Lipid panel   TSH   Tremor    Marked tremor at rest with difficulty eating will refer to neurology to further evaluate      Relevant Orders   Lipid panel   TSH   Ambulatory referral to Neurology   Inguinal hernia    Massively enlarged right inguinal hernia Will leave alone at patient's request      Weight loss    Multifactor weight loss with probable malnutrition hopefully will get improved with improvement of other conditions will need to continue observing discussed diet nutrition with patient and daughters.       Other Visit Diagnoses    Annual physical exam    -  Primary   Relevant Orders   Flu Vaccine QUAD 36+ mos IM   Hepatitis C antibody   Lipid panel   PSA   TSH   Colon cancer screening       Need for influenza vaccination       Relevant Orders   Flu Vaccine QUAD 36+ mos IM   Need  for Zostavax administration       Screening cholesterol level       Relevant Orders   Lipid panel   Prostate cancer screening       Relevant Orders   PSA   Thyroid disorder screen       Relevant Orders   TSH   Need for hepatitis C screening test       Relevant Orders   Hepatitis C antibody       Follow up plan: Return in about 4 weeks (around 06/13/2016) for Multiple medical problems recheck.Marland Kitchen

## 2016-05-16 NOTE — Assessment & Plan Note (Signed)
Stable off medications for now

## 2016-05-17 LAB — LIPID PANEL
Chol/HDL Ratio: 5.1 ratio units — ABNORMAL HIGH (ref 0.0–5.0)
Cholesterol, Total: 225 mg/dL — ABNORMAL HIGH (ref 100–199)
HDL: 44 mg/dL (ref >39)
LDL Calculated: 146 mg/dL — ABNORMAL HIGH (ref 0–99)
Triglycerides: 175 mg/dL — ABNORMAL HIGH (ref 0–149)
VLDL CHOLESTEROL CAL: 35 mg/dL (ref 5–40)

## 2016-05-17 LAB — HEPATITIS C ANTIBODY: HEP C VIRUS AB: 0.1 {s_co_ratio} (ref 0.0–0.9)

## 2016-05-17 LAB — PSA: PROSTATE SPECIFIC AG, SERUM: 4.8 ng/mL — AB (ref 0.0–4.0)

## 2016-05-17 LAB — TSH: TSH: 0.925 u[IU]/mL (ref 0.450–4.500)

## 2016-05-20 ENCOUNTER — Encounter: Payer: Self-pay | Admitting: Family Medicine

## 2016-05-29 DIAGNOSIS — F3113 Bipolar disorder, current episode manic without psychotic features, severe: Secondary | ICD-10-CM | POA: Diagnosis not present

## 2016-06-25 DIAGNOSIS — E538 Deficiency of other specified B group vitamins: Secondary | ICD-10-CM | POA: Diagnosis not present

## 2016-06-25 DIAGNOSIS — E559 Vitamin D deficiency, unspecified: Secondary | ICD-10-CM | POA: Diagnosis not present

## 2016-06-25 DIAGNOSIS — G251 Drug-induced tremor: Secondary | ICD-10-CM | POA: Diagnosis not present

## 2016-06-25 DIAGNOSIS — G250 Essential tremor: Secondary | ICD-10-CM | POA: Diagnosis not present

## 2016-06-26 ENCOUNTER — Other Ambulatory Visit: Payer: Self-pay | Admitting: Neurology

## 2016-06-26 DIAGNOSIS — G251 Drug-induced tremor: Secondary | ICD-10-CM

## 2016-07-02 ENCOUNTER — Encounter: Payer: Self-pay | Admitting: Family Medicine

## 2016-07-02 ENCOUNTER — Ambulatory Visit (INDEPENDENT_AMBULATORY_CARE_PROVIDER_SITE_OTHER): Payer: Medicare Other | Admitting: Family Medicine

## 2016-07-02 DIAGNOSIS — F312 Bipolar disorder, current episode manic severe with psychotic features: Secondary | ICD-10-CM | POA: Diagnosis not present

## 2016-07-02 DIAGNOSIS — N138 Other obstructive and reflux uropathy: Secondary | ICD-10-CM | POA: Diagnosis not present

## 2016-07-02 DIAGNOSIS — N401 Enlarged prostate with lower urinary tract symptoms: Secondary | ICD-10-CM | POA: Diagnosis not present

## 2016-07-02 DIAGNOSIS — K2100 Gastro-esophageal reflux disease with esophagitis, without bleeding: Secondary | ICD-10-CM

## 2016-07-02 DIAGNOSIS — I10 Essential (primary) hypertension: Secondary | ICD-10-CM

## 2016-07-02 MED ORDER — OMEPRAZOLE 40 MG PO CPDR: 40.0000 mg | DELAYED_RELEASE_CAPSULE | Freq: Every day | ORAL | 4 refills | Status: DC

## 2016-07-02 MED ORDER — TAMSULOSIN HCL 0.4 MG PO CAPS: 0.4000 mg | ORAL_CAPSULE | Freq: Every day | ORAL | 4 refills | Status: DC

## 2016-07-02 NOTE — Assessment & Plan Note (Signed)
The current medical regimen is effective;  continue present plan and medications.  

## 2016-07-02 NOTE — Progress Notes (Signed)
BP 132/74   Pulse 74   Ht 5\' 6"  (1.676 m)   Wt 135 lb (61.2 kg)   BMI 21.79 kg/m    Subjective:    Patient ID: Tony Fowler, male    DOB: 06-25-1945, 71 y.o.   MRN: [id number]  HPI: Tony Fowler is a 71 y.o. male  Chief Complaint  Patient presents with  . Follow-up  Patient accompanied by his daughter who assists with history. Patient is been able to eat better and feeling much better with nutrition. Outlook and stamina is much improved. Patient stabilizing now on Depakote lower dose. Reviewed renal function and blood work which is back to normal. Bladder control issues are much improved taking Flomax without problems not having to wear diaper anymore still gets up at night to use the bathroom frequently but otherwise doing well.  Relevant past medical, surgical, family and social history reviewed and updated as indicated. Interim medical history since our last visit reviewed. Allergies and medications reviewed and updated.  Review of Systems  Constitutional: Negative.   Respiratory: Negative.   Cardiovascular: Negative.     Per HPI unless specifically indicated above     Objective:    BP 132/74   Pulse 74   Ht 5\' 6"  (1.676 m)   Wt 135 lb (61.2 kg)   BMI 21.79 kg/m   Wt Readings from Last 3 Encounters:  07/02/16 135 lb (61.2 kg)  05/16/16 123 lb 12.8 oz (56.2 kg)  04/20/16 137 lb (62.1 kg)    Physical Exam  Constitutional: He is oriented to person, place, and time. He appears well-developed and well-nourished.  HENT:  Head: Normocephalic and atraumatic.  Eyes: Conjunctivae and EOM are normal.  Neck: Normal range of motion.  Cardiovascular: Normal rate, regular rhythm and normal heart sounds.   Pulmonary/Chest: Effort normal and breath sounds normal.  Musculoskeletal: Normal range of motion.  Neurological: He is alert and oriented to person, place, and time.  Skin: No erythema.  Psychiatric: He has a normal mood and affect. His behavior is normal.  Judgment and thought content normal.    Results for orders placed or performed in visit on 05/16/16  Hepatitis C antibody  Result Value Ref Range   Hep C Virus Ab 0.1 0.0 - 0.9 s/co ratio  Lipid panel  Result Value Ref Range   Cholesterol, Total 225 (H) 100 - 199 mg/dL   Triglycerides 829 (H) 0 - 149 mg/dL   HDL 44 >56 mg/dL   VLDL Cholesterol Cal 35 5 - 40 mg/dL   LDL Calculated 213 (H) 0 - 99 mg/dL   Chol/HDL Ratio 5.1 (H) 0.0 - 5.0 ratio units  PSA  Result Value Ref Range   Prostate Specific Ag, Serum 4.8 (H) 0.0 - 4.0 ng/mL  TSH  Result Value Ref Range   TSH 0.925 0.450 - 4.500 uIU/mL      Assessment & Plan:   Problem List Items Addressed This Visit      Cardiovascular and Mediastinum   Benign hypertension (Chronic)    The current medical regimen is effective;  continue present plan and medications.         Digestive   Reflux esophagitis   Relevant Medications   omeprazole (PRILOSEC) 40 MG capsule     Genitourinary   BPH with obstruction/lower urinary tract symptoms    The current medical regimen is effective;  continue present plan and medications.       Relevant Medications   tamsulosin (FLOMAX) 0.4  MG CAPS capsule     Other   Bipolar affective disorder, manic, severe, with psychotic behavior (HCC)    The current medical regimen is effective;  continue present plan and medications.           Follow up plan: Return in about 6 months (around 01/02/2017) for BMP,  Lipids, ALT, AST.

## 2016-07-08 ENCOUNTER — Ambulatory Visit
Admission: RE | Admit: 2016-07-08 | Discharge: 2016-07-08 | Disposition: A | Discharge status: Home / Self Care | Payer: Medicare Other | Source: Ambulatory Visit | Attending: Neurology | Admitting: Neurology

## 2016-07-08 DIAGNOSIS — G251 Drug-induced tremor: Secondary | ICD-10-CM | POA: Diagnosis not present

## 2016-07-08 DIAGNOSIS — R600 Localized edema: Secondary | ICD-10-CM | POA: Diagnosis not present

## 2016-08-21 DIAGNOSIS — G251 Drug-induced tremor: Secondary | ICD-10-CM | POA: Diagnosis not present

## 2016-08-22 DIAGNOSIS — F3113 Bipolar disorder, current episode manic without psychotic features, severe: Secondary | ICD-10-CM | POA: Diagnosis not present

## 2016-11-21 DIAGNOSIS — F3113 Bipolar disorder, current episode manic without psychotic features, severe: Secondary | ICD-10-CM | POA: Diagnosis not present

## 2016-11-27 DIAGNOSIS — G251 Drug-induced tremor: Secondary | ICD-10-CM | POA: Diagnosis not present

## 2017-01-02 ENCOUNTER — Ambulatory Visit (INDEPENDENT_AMBULATORY_CARE_PROVIDER_SITE_OTHER): Payer: Medicare Other | Admitting: Family Medicine

## 2017-01-02 ENCOUNTER — Encounter: Payer: Self-pay | Admitting: Family Medicine

## 2017-01-02 VITALS — BP 144/84 | HR 78 | Temp 98.4°F | Wt 140.8 lb

## 2017-01-02 DIAGNOSIS — N401 Enlarged prostate with lower urinary tract symptoms: Secondary | ICD-10-CM

## 2017-01-02 DIAGNOSIS — Z13220 Encounter for screening for lipoid disorders: Secondary | ICD-10-CM | POA: Diagnosis not present

## 2017-01-02 DIAGNOSIS — R251 Tremor, unspecified: Secondary | ICD-10-CM | POA: Diagnosis not present

## 2017-01-02 DIAGNOSIS — N138 Other obstructive and reflux uropathy: Secondary | ICD-10-CM | POA: Diagnosis not present

## 2017-01-02 DIAGNOSIS — Z23 Encounter for immunization: Secondary | ICD-10-CM | POA: Diagnosis not present

## 2017-01-02 DIAGNOSIS — I10 Essential (primary) hypertension: Secondary | ICD-10-CM | POA: Diagnosis not present

## 2017-01-02 DIAGNOSIS — E7889 Other lipoprotein metabolism disorders: Secondary | ICD-10-CM | POA: Diagnosis not present

## 2017-01-02 DIAGNOSIS — F312 Bipolar disorder, current episode manic severe with psychotic features: Secondary | ICD-10-CM | POA: Diagnosis not present

## 2017-01-02 LAB — LP+ALT+AST PICCOLO, WAIVED
ALT (SGPT) PICCOLO, WAIVED: 21 U/L (ref 10–47)
AST (SGOT) Piccolo, Waived: 40 U/L — ABNORMAL HIGH (ref 11–38)
Chol/HDL Ratio Piccolo,Waive: 3.1 mg/dL
Cholesterol Piccolo, Waived: 210 mg/dL — ABNORMAL HIGH (ref <200)
HDL CHOL PICCOLO, WAIVED: 67 mg/dL (ref >59)
LDL Chol Calc Piccolo Waived: 125 mg/dL — ABNORMAL HIGH (ref <100)
Triglycerides Piccolo,Waived: 88 mg/dL (ref <150)
VLDL CHOL CALC PICCOLO,WAIVE: 18 mg/dL (ref <30)

## 2017-01-02 NOTE — Patient Instructions (Signed)

## 2017-01-02 NOTE — Progress Notes (Signed)
BP (!) 144/84 (BP Location: Left Arm)   Pulse 78   Temp 98.4 F (36.9 C)   Wt 140 lb 12.8 oz (63.9 kg)   SpO2 98%   BMI 22.73 kg/m    Subjective:    Patient ID: Tony Fowler, male    DOB: 1945/10/13, 71 y.o.   MRN: [id number]  HPI: Tony Fowler is a 71 y.o. male  Med check Bipolar stable now on depakote and stable Agents appetite has returned and is able to eat and gained weight feeling little stronger. Tremor remains about the same but is learning to live with it. Also taking medication for tremors which helps some. Tobacco use patient is down to 10 cigarettes a day now is doing better.    Relevant past medical, surgical, family and social history reviewed and updated as indicated. Interim medical history since our last visit reviewed. Allergies and medications reviewed and updated.  Review of Systems  Constitutional: Negative.   Respiratory: Negative.   Cardiovascular: Negative.     Per HPI unless specifically indicated above     Objective:    BP (!) 144/84 (BP Location: Left Arm)   Pulse 78   Temp 98.4 F (36.9 C)   Wt 140 lb 12.8 oz (63.9 kg)   SpO2 98%   BMI 22.73 kg/m   Wt Readings from Last 3 Encounters:  01/02/17 140 lb 12.8 oz (63.9 kg)  07/02/16 135 lb (61.2 kg)  05/16/16 123 lb 12.8 oz (56.2 kg)    Physical Exam  Constitutional: He is oriented to person, place, and time. He appears well-developed and well-nourished.  HENT:  Head: Normocephalic and atraumatic.  Eyes: Conjunctivae and EOM are normal.  Neck: Normal range of motion.  Cardiovascular: Normal rate, regular rhythm and normal heart sounds.   Pulmonary/Chest: Effort normal and breath sounds normal.  Musculoskeletal: Normal range of motion.  Neurological: He is alert and oriented to person, place, and time.  Skin: No erythema.  Psychiatric: He has a normal mood and affect. His behavior is normal. Judgment and thought content normal.    Results for orders placed or performed in  visit on 05/16/16  Hepatitis C antibody  Result Value Ref Range   Hep C Virus Ab 0.1 0.0 - 0.9 s/co ratio  Lipid panel  Result Value Ref Range   Cholesterol, Total 225 (H) 100 - 199 mg/dL   Triglycerides 409 (H) 0 - 149 mg/dL   HDL 44 >81 mg/dL   VLDL Cholesterol Cal 35 5 - 40 mg/dL   LDL Calculated 191 (H) 0 - 99 mg/dL   Chol/HDL Ratio 5.1 (H) 0.0 - 5.0 ratio units  PSA  Result Value Ref Range   Prostate Specific Ag, Serum 4.8 (H) 0.0 - 4.0 ng/mL  TSH  Result Value Ref Range   TSH 0.925 0.450 - 4.500 uIU/mL      Assessment & Plan:   Problem List Items Addressed This Visit      Cardiovascular and Mediastinum   Benign hypertension - Primary (Chronic)    The current medical regimen is effective;  continue present plan and medications.       Relevant Medications   aspirin EC 81 MG tablet   Other Relevant Orders   Basic metabolic panel     Genitourinary   BPH with obstruction/lower urinary tract symptoms    The current medical regimen is effective;  continue present plan and medications.         Other   Bipolar  affective disorder, manic, severe, with psychotic behavior (HCC)    The current medical regimen is effective;  continue present plan and medications.       Tremor    The current medical regimen is effective;  continue present plan and medications.        Other Visit Diagnoses    Screening cholesterol level       Relevant Orders   LP+ALT+AST [place], Waived   Need for influenza vaccination       Relevant Orders   Flu vaccine HIGH DOSE PF (Completed)       Follow up plan: Return in about 6 months (around 07/02/2017) for Physical Exam.

## 2017-01-02 NOTE — Assessment & Plan Note (Signed)
The current medical regimen is effective;  continue present plan and medications.  

## 2017-01-03 LAB — BASIC METABOLIC PANEL
BUN/Creatinine Ratio: 17 (ref 10–24)
BUN: 22 mg/dL (ref 8–27)
CO2: 24 mmol/L (ref 20–29)
CREATININE: 1.26 mg/dL (ref 0.76–1.27)
Calcium: 9.7 mg/dL (ref 8.6–10.2)
Chloride: 103 mmol/L (ref 96–106)
GFR, EST AFRICAN AMERICAN: 67 mL/min/{1.73_m2} (ref >59)
GFR, EST NON AFRICAN AMERICAN: 58 mL/min/{1.73_m2} — AB (ref >59)
Glucose: 82 mg/dL (ref 65–99)
POTASSIUM: 4.8 mmol/L (ref 3.5–5.2)
Sodium: 141 mmol/L (ref 134–144)

## 2017-02-04 ENCOUNTER — Encounter: Payer: Self-pay | Admitting: Family Medicine

## 2017-02-04 DIAGNOSIS — E878 Other disorders of electrolyte and fluid balance, not elsewhere classified: Secondary | ICD-10-CM | POA: Insufficient documentation

## 2017-02-20 DIAGNOSIS — F3113 Bipolar disorder, current episode manic without psychotic features, severe: Secondary | ICD-10-CM | POA: Diagnosis not present

## 2017-03-28 DIAGNOSIS — F3113 Bipolar disorder, current episode manic without psychotic features, severe: Secondary | ICD-10-CM | POA: Diagnosis not present

## 2017-05-21 DIAGNOSIS — G251 Drug-induced tremor: Secondary | ICD-10-CM | POA: Diagnosis not present

## 2017-05-23 DIAGNOSIS — F3113 Bipolar disorder, current episode manic without psychotic features, severe: Secondary | ICD-10-CM | POA: Diagnosis not present

## 2017-06-25 ENCOUNTER — Ambulatory Visit (INDEPENDENT_AMBULATORY_CARE_PROVIDER_SITE_OTHER): Payer: Medicare Other

## 2017-06-25 VITALS — BP 142/80 | HR 82 | Temp 98.6°F | Resp 16 | Ht 66.0 in | Wt 148.7 lb

## 2017-06-25 DIAGNOSIS — G40909 Epilepsy, unspecified, not intractable, without status epilepticus: Secondary | ICD-10-CM | POA: Diagnosis not present

## 2017-06-25 DIAGNOSIS — N400 Enlarged prostate without lower urinary tract symptoms: Secondary | ICD-10-CM

## 2017-06-25 DIAGNOSIS — Z0000 Encounter for general adult medical examination without abnormal findings: Secondary | ICD-10-CM | POA: Diagnosis not present

## 2017-06-25 DIAGNOSIS — R5383 Other fatigue: Secondary | ICD-10-CM

## 2017-06-25 DIAGNOSIS — I10 Essential (primary) hypertension: Secondary | ICD-10-CM

## 2017-06-25 DIAGNOSIS — E7800 Pure hypercholesterolemia, unspecified: Secondary | ICD-10-CM | POA: Diagnosis not present

## 2017-06-25 LAB — URINALYSIS, ROUTINE W REFLEX MICROSCOPIC
BILIRUBIN UA: NEGATIVE
Glucose, UA: NEGATIVE
KETONES UA: NEGATIVE
Leukocytes, UA: NEGATIVE
NITRITE UA: NEGATIVE
Protein, UA: NEGATIVE
RBC, UA: NEGATIVE
Specific Gravity, UA: <1.005 — ABNORMAL LOW (ref 1.005–1.030)
UUROB: 0.2 mg/dL (ref 0.2–1.0)
pH, UA: 7 (ref 5.0–7.5)

## 2017-06-25 NOTE — Progress Notes (Signed)
Subjective:   Tony Fowler is a 72 y.o. male who presents for Medicare Annual/Subsequent preventive examination.  Review of Systems:  Cardiac Risk Factors include: advanced age (>15men, >80 women);smoking/ tobacco exposure     Objective:    Vitals: BP (!) 142/80 (BP Location: Left Arm, Patient Position: Sitting)   Pulse 82   Temp 98.6 F (37 C) (Temporal)   Resp 16   Ht 5\' 6"  (1.676 m)   Wt 148 lb 11.2 oz (67.4 kg)   BMI 24.00 kg/m   Body mass index is 24 kg/m.  Advanced Directives 06/25/2017 04/20/2016  Does Patient Have a Medical Advance Directive? No No  Would patient like information on creating a medical advance directive? Yes (MAU/Ambulatory/Procedural Areas - Information given) -  Some encounter information is confidential and restricted. Go to Review Flowsheets activity to see all data.    Tobacco Social History   Tobacco Use  Smoking Status Current Every Day Smoker  . Packs/day: 0.50  . Years: 0.00  . Pack years: 0.00  . Types: Cigarettes  Smokeless Tobacco Never Used     Ready to quit: Yes Counseling given: Yes   Clinical Intake:  Pre-visit preparation completed: Yes  Pain : No/denies pain     Nutritional Status: BMI of 19-24  Normal Nutritional Risks: None Diabetes: No  How often do you need to have someone help you when you read instructions, pamphlets, or other written materials from your doctor or pharmacy?: 1 - Never What is the last grade level you completed in school?: BA degree  Interpreter Needed?: No  Information entered by :: Tiffany Hill,LPN   Past Medical History:  Diagnosis Date  . Bipolar 1 disorder [facility])    Past Surgical History:  Procedure Laterality Date  . TONSILECTOMY/ADENOIDECTOMY WITH MYRINGOTOMY     Family History  Problem Relation Age of Onset  . Heart disease Mother   . Hypertension Mother   . Mental illness Mother   . Heart disease Brother   . Hypertension Brother    Social History   Socioeconomic  History  . Marital status: Divorced    Spouse name: None  . Number of children: None  . Years of education: None  . Highest education level: None  Social Needs  . Financial resource strain: Not hard at all  . Food insecurity - worry: Never true  . Food insecurity - inability: Never true  . Transportation needs - medical: No  . Transportation needs - non-medical: No  Occupational History  . None  Tobacco Use  . Smoking status: Current Every Day Smoker    Packs/day: 0.50    Years: 0.00    Pack years: 0.00    Types: Cigarettes  . Smokeless tobacco: Never Used  Substance and Sexual Activity  . Alcohol use: No    Alcohol/week: 0.0 oz    Comment: 1 beer occasionally   . Drug use: No  . Sexual activity: None  Other Topics Concern  . None  Social History Narrative  . None    Outpatient Encounter Medications as of 06/25/2017  Medication Sig  . ARIPiprazole (ABILIFY) 2 MG tablet Take by mouth.  Marland Kitchen aspirin EC 81 MG tablet Take 81 mg by mouth daily.  . divalproex (DEPAKOTE) 500 MG DR tablet Take 1 tablet (500 mg total) by mouth 2 (two) times daily. (Patient taking differently: Take 1,000 mg by mouth at bedtime. )  . gabapentin (NEURONTIN) 300 MG capsule Take 300 mg by mouth 2 (two)  times daily.  . Multiple Vitamin (MULTIVITAMIN) tablet Take 1 tablet by mouth daily.  Marland Kitchen. omeprazole (PRILOSEC) 40 MG capsule Take 1 capsule (40 mg total) by mouth daily.  . tamsulosin (FLOMAX) 0.4 MG CAPS capsule Take 1 capsule (0.4 mg total) by mouth daily.  . [DISCONTINUED] ARIPiprazole (ABILIFY) 5 MG tablet Take 5 mg by mouth daily.  . [DISCONTINUED] FLUoxetine (PROZAC) 20 MG tablet Take 20 mg by mouth daily.   No facility-administered encounter medications on file as of 06/25/2017.     Activities of Daily Living In your present state of health, do you have any difficulty performing the following activities: 06/25/2017 01/02/2017  Hearing? Y Y  Comment - right ear  Vision? Y N  Comment right eye   slight cataracts  Difficulty concentrating or making decisions? N N  Walking or climbing stairs? Y N  Comment wears knee brace -  Dressing or bathing? N N  Doing errands, shopping? N N  Preparing Food and eating ? N -  Using the Toilet? N -  In the past six months, have you accidently leaked urine? N -  Do you have problems with loss of bowel control? N -  Managing your Medications? N -  Managing your Finances? N -  Housekeeping or managing your Housekeeping? N -  Some recent data might be hidden    Patient Care Team: Steele Sizerrissman, Mark A, MD as PCP - General (Family Medicine)   Assessment:   This is a routine wellness examination for Tony Fowler.  Exercise Activities and Dietary recommendations Current Exercise Habits: The patient does not participate in regular exercise at present, Exercise limited by: None identified  Goals    . Quit Smoking     Smoking cessation discussed       Fall Risk Fall Risk  06/25/2017 01/02/2017 07/02/2016 05/16/2016 02/22/2015  Falls in the past year? No Yes Yes Yes No  Number falls in past yr: - 1 2 or more 2 or more -  Injury with Fall? - No No No -   Is the patient's home free of loose throw rugs in walkways, pet beds, electrical cords, etc?   yes      Grab bars in the bathroom? yes      Handrails on the stairs?   yes      Adequate lighting?   yes  Timed Get Up and Go Performed: Completed in 8 seconds with no use of assistive devices, steady gait. No intervention needed at this time.   Depression Screen PHQ 2/9 Scores 06/25/2017 01/02/2017 02/22/2015  PHQ - 2 Score 0 2 0  PHQ- 9 Score - 2 -    Cognitive Function     6CIT Screen 06/25/2017  What Year? 0 points  What month? 0 points  What time? 0 points  Count back from 20 0 points  Months in reverse 0 points  Repeat phrase 0 points  Total Score 0    Immunization History  Administered Date(s) Administered  . Influenza, High Dose Seasonal PF 05/16/2016, 01/02/2017  . Influenza,inj,Quad  PF,6+ Mos 02/22/2015  . Influenza-Unspecified 02/16/2014  . Pneumococcal Conjugate-13 02/22/2015  . Pneumococcal-Unspecified 01/15/2012  . Td 12/14/2012    Qualifies for Shingles Vaccine? Not eligible  Screening Tests Health Maintenance  Topic Date Due  . COLONOSCOPY  01/10/1997  . TETANUS/TDAP  12/15/2022  . INFLUENZA VACCINE  Completed  . Hepatitis C Screening  Completed  . PNA vac Low Risk Adult  Completed   Cancer Screenings: Lung:  Low Dose CT Chest recommended if Age 38-80 years, 30 pack-year currently smoking OR have quit w/in 15years. Patient does qualify.- declined Colorectal: declined  Additional Screenings:  Hepatitis B/HIV/Syphillis: not indicated  Hepatitis C Screening: completed 05/16/2016    Plan:    I have personally reviewed and addressed the Medicare Annual Wellness questionnaire and have noted the following in the patient's chart:  A. Medical and social history B. Use of alcohol, tobacco or illicit drugs  C. Current medications and supplements D. Functional ability and status E.  Nutritional status F.  Physical activity G. Advance directives H. List of other physicians I.  Hospitalizations, surgeries, and ER visits in previous 12 months J.  Vitals K. Screenings such as hearing and vision if needed, cognitive and depression L. Referrals and appointments   In addition, I have reviewed and discussed with patient certain preventive protocols, quality metrics, and best practice recommendations. A written personalized care plan for preventive services as well as general preventive health recommendations were provided to patient.   Signed,  Marin Roberts, LPN Nurse Health Advisor   Nurse Notes:none

## 2017-06-25 NOTE — Patient Instructions (Addendum)
[name] , Thank you for taking time to come for your Medicare Wellness Visit. I appreciate your ongoing commitment to your health goals. Please review the following plan we discussed and let me know if I can assist you in the future.   Screening recommendations/referrals: Colonoscopy: declined Recommended yearly ophthalmology/optometry visit for glaucoma screening and checkup Recommended yearly dental visit for hygiene and checkup  Vaccinations: Influenza vaccine: up to date Pneumococcal vaccine:up to date Tdap vaccine: up to date Shingles vaccine: not eligible   Advanced directives: Advance directive discussed with you today. I have provided a copy for you to complete at home and have notarized. Once this is complete please bring a copy in to our office so we can scan it into your chart.  Conditions/risks identified: smoking cessation discussed- declined lung cancer screening   Next appointment: Follow up on 06/26/2017 at 9:00am with Dr.Crissman. Follow up in one year for your annual wellness exam.   Preventive Care 65 Years and Older, Male Preventive care refers to lifestyle choices and visits with your health care provider that can promote health and wellness. What does preventive care include?  A yearly physical exam. This is also called an annual well check.  Dental exams once or twice a year.  Routine eye exams. Ask your health care provider how often you should have your eyes checked.  Personal lifestyle choices, including:  Daily care of your teeth and gums.  Regular physical activity.  Eating a healthy diet.  Avoiding tobacco and drug use.  Limiting alcohol use.  Practicing safe sex.  Taking low doses of aspirin every day.  Taking vitamin and mineral supplements as recommended by your health care provider. What happens during an annual well check? The services and screenings done by your health care provider during your annual well check will depend on your  age, overall health, lifestyle risk factors, and family history of disease. Counseling  Your health care provider may ask you questions about your:  Alcohol use.  Tobacco use.  Drug use.  Emotional well-being.  Home and relationship well-being.  Sexual activity.  Eating habits.  History of falls.  Memory and ability to understand (cognition).  Work and work [employer]. Screening  You may have the following tests or measurements:  Height, weight, and BMI.  Blood pressure.  Lipid and cholesterol levels. These may be checked every 5 years, or more frequently if you are over 71 years old.  Skin check.  Lung cancer screening. You may have this screening every year starting at age 71 if you have a 30-pack-year history of smoking and currently smoke or have quit within the past 15 years.  Fecal occult blood test (FOBT) of the stool. You may have this test every year starting at age 71.  Flexible sigmoidoscopy or colonoscopy. You may have a sigmoidoscopy every 5 years or a colonoscopy every 10 years starting at age 71.  Prostate cancer screening. Recommendations will vary depending on your family history and other risks.  Hepatitis C blood test.  Hepatitis B blood test.  Sexually transmitted disease (STD) testing.  Diabetes screening. This is done by checking your blood sugar (glucose) after you have not eaten for a while (fasting). You may have this done every 1-3 years.  Abdominal aortic aneurysm (AAA) screening. You may need this if you are a current or former smoker.  Osteoporosis. You may be screened starting at age 71 if you are at high risk. Talk with your health care provider about  your test results, treatment options, and if necessary, the need for more tests. Vaccines  Your health care provider may recommend certain vaccines, such as:  Influenza vaccine. This is recommended every year.  Tetanus, diphtheria, and acellular pertussis (Tdap, Td) vaccine. You  may need a Td booster every 10 years.  Zoster vaccine. You may need this after age 59.  Pneumococcal 13-valent conjugate (PCV13) vaccine. One dose is recommended after age 66.  Pneumococcal polysaccharide (PPSV23) vaccine. One dose is recommended after age 32. Talk to your health care provider about which screenings and vaccines you need and how often you need them. This information is not intended to replace advice given to you by your health care provider. Make sure you discuss any questions you have with your health care provider. Document Released: 04/21/2015 Document Revised: 12/13/2015 Document Reviewed: 01/24/2015 Elsevier Interactive Patient Education  2017 [place] Prevention in the Home Falls can cause injuries. They can happen to people of all ages. There are many things you can do to make your home safe and to help prevent falls. What can I do on the outside of my home?  Regularly fix the edges of walkways and driveways and fix any cracks.  Remove anything that might make you trip as you walk through a door, such as a raised step or threshold.  Trim any bushes or trees on the path to your home.  Use bright outdoor lighting.  Clear any walking paths of anything that might make someone trip, such as rocks or tools.  Regularly check to see if handrails are loose or broken. Make sure that both sides of any steps have handrails.  Any raised decks and porches should have guardrails on the edges.  Have any leaves, snow, or ice cleared regularly.  Use sand or salt on walking paths during winter.  Clean up any spills in your garage right away. This includes oil or grease spills. What can I do in the bathroom?  Use night lights.  Install grab bars by the toilet and in the tub and shower. Do not use towel bars as grab bars.  Use non-skid mats or decals in the tub or shower.  If you need to sit down in the shower, use a plastic, non-slip stool.  Keep the floor  dry. Clean up any water that spills on the floor as soon as it happens.  Remove soap buildup in the tub or shower regularly.  Attach bath mats securely with double-sided non-slip rug tape.  Do not have throw rugs and other things on the floor that can make you trip. What can I do in the bedroom?  Use night lights.  Make sure that you have a light by your bed that is easy to reach.  Do not use any sheets or blankets that are too big for your bed. They should not hang down onto the floor.  Have a firm chair that has side arms. You can use this for support while you get dressed.  Do not have throw rugs and other things on the floor that can make you trip. What can I do in the kitchen?  Clean up any spills right away.  Avoid walking on wet floors.  Keep items that you use a lot in easy-to-reach places.  If you need to reach something above you, use a strong step stool that has a grab bar.  Keep electrical cords out of the way.  Do not use floor polish or wax  that makes floors slippery. If you must use wax, use non-skid floor wax.  Do not have throw rugs and other things on the floor that can make you trip. What can I do with my stairs?  Do not leave any items on the stairs.  Make sure that there are handrails on both sides of the stairs and use them. Fix handrails that are broken or loose. Make sure that handrails are as long as the stairways.  Check any carpeting to make sure that it is firmly attached to the stairs. Fix any carpet that is loose or worn.  Avoid having throw rugs at the top or bottom of the stairs. If you do have throw rugs, attach them to the floor with carpet tape.  Make sure that you have a light switch at the top of the stairs and the bottom of the stairs. If you do not have them, ask someone to add them for you. What else can I do to help prevent falls?  Wear shoes that:  Do not have high heels.  Have rubber bottoms.  Are comfortable and fit you  well.  Are closed at the toe. Do not wear sandals.  If you use a stepladder:  Make sure that it is fully opened. Do not climb a closed stepladder.  Make sure that both sides of the stepladder are locked into place.  Ask someone to hold it for you, if possible.  Clearly mark and make sure that you can see:  Any grab bars or handrails.  First and last steps.  Where the edge of each step is.  Use tools that help you move around (mobility aids) if they are needed. These include:  Canes.  Walkers.  Scooters.  Crutches.  Turn on the lights when you go into a dark area. Replace any light bulbs as soon as they burn out.  Set up your furniture so you have a clear path. Avoid moving your furniture around.  If any of your floors are uneven, fix them.  If there are any pets around you, be aware of where they are.  Review your medicines with your doctor. Some medicines can make you feel dizzy. This can increase your chance of falling. Ask your doctor what other things that you can do to help prevent falls. This information is not intended to replace advice given to you by your health care provider. Make sure you discuss any questions you have with your health care provider. Document Released: 01/19/2009 Document Revised: 08/31/2015 Document Reviewed: 04/29/2014 Elsevier Interactive Patient Education  2017 [employer]   Fat and Cholesterol Restricted Diet Getting too much fat and cholesterol in your diet may cause health problems. Following this diet helps keep your fat and cholesterol at normal levels. This can keep you from getting sick. What types of fat should I choose?  Choose monosaturated and polyunsaturated fats. These are found in foods such as olive oil, canola oil, flaxseeds, walnuts, almonds, and seeds.  Eat more omega-3 fats. Good choices include salmon, mackerel, sardines, tuna, flaxseed oil, and ground flaxseeds.  Limit saturated fats. These are in animal  products such as meats, butter, and cream. They can also be in plant products such as palm oil, palm kernel oil, and coconut oil.  Avoid foods with partially hydrogenated oils in them. These contain trans fats. Examples of foods that have trans fats are stick margarine, some tub margarines, cookies, crackers, and other baked goods. What general guidelines do I need to  follow?  Check food labels. Look for the words "trans fat" and "saturated fat."  When preparing a meal: ? Fill half of your plate with vegetables and green salads. ? Fill one fourth of your plate with whole grains. Look for the word "whole" as the first word in the ingredient list. ? Fill one fourth of your plate with lean protein foods.  Eat more foods that have fiber, like apples, carrots, beans, peas, and barley.  Eat more home-cooked foods. Eat less at restaurants and buffets.  Limit or avoid alcohol.  Limit foods high in starch and sugar.  Limit fried foods.  Cook foods without frying them. Baking, boiling, grilling, and broiling are all great options.  Lose weight if you are overweight. Losing even a small amount of weight can help your overall health. It can also help prevent diseases such as diabetes and heart disease. What foods can I eat? Grains Whole grains, such as whole wheat or whole grain breads, crackers, cereals, and pasta. Unsweetened oatmeal, bulgur, barley, quinoa, or brown rice. Corn or whole wheat flour tortillas. Vegetables Fresh or frozen vegetables (raw, steamed, roasted, or grilled). Green salads. Fruits All fresh, canned (in natural juice), or frozen fruits. Meat and Other Protein Products Ground beef (85% or leaner), grass-fed beef, or beef trimmed of fat. Skinless chicken or [place]. Ground chicken or [place]. Pork trimmed of fat. All fish and seafood. Eggs. Dried beans, peas, or lentils. Unsalted nuts or seeds. Unsalted canned or dry beans. Dairy Low-fat dairy products, such as skim or 1%  milk, 2% or reduced-fat cheeses, low-fat ricotta or cottage cheese, or plain low-fat yogurt. Fats and Oils Tub margarines without trans fats. Light or reduced-fat mayonnaise and salad dressings. Avocado. Olive, canola, sesame, or safflower oils. Natural peanut or almond butter (choose ones without added sugar and oil). The items listed above may not be a complete list of recommended foods or beverages. Contact your dietitian for more options. What foods are not recommended? Grains White bread. White pasta. White rice. Cornbread. Bagels, pastries, and croissants. Crackers that contain trans fat. Vegetables White potatoes. Corn. Creamed or fried vegetables. Vegetables in a cheese sauce. Fruits Dried fruits. Canned fruit in light or heavy syrup. Fruit juice. Meat and Other Protein Products Fatty cuts of meat. Ribs, chicken wings, bacon, sausage, bologna, salami, chitterlings, fatback, hot dogs, bratwurst, and packaged luncheon meats. Liver and organ meats. Dairy Whole or 2% milk, cream, half-and-half, and cream cheese. Whole milk cheeses. Whole-fat or sweetened yogurt. Full-fat cheeses. Nondairy creamers and whipped toppings. Processed cheese, cheese spreads, or cheese curds. Sweets and Desserts Corn syrup, sugars, honey, and molasses. Candy. Jam and jelly. Syrup. Sweetened cereals. Cookies, pies, cakes, donuts, muffins, and ice cream. Fats and Oils Butter, stick margarine, lard, shortening, ghee, or bacon fat. Coconut, palm kernel, or palm oils. Beverages Alcohol. Sweetened drinks (such as sodas, lemonade, and fruit drinks or punches). The items listed above may not be a complete list of foods and beverages to avoid. Contact your dietitian for more information. This information is not intended to replace advice given to you by your health care provider. Make sure you discuss any questions you have with your health care provider. Document Released: 09/24/2011 Document Revised: 11/30/2015 Document  Reviewed: 06/24/2013 Elsevier Interactive Patient Education  [employer].

## 2017-06-26 ENCOUNTER — Ambulatory Visit (INDEPENDENT_AMBULATORY_CARE_PROVIDER_SITE_OTHER): Payer: Medicare Other | Admitting: Family Medicine

## 2017-06-26 ENCOUNTER — Encounter: Payer: Self-pay | Admitting: Family Medicine

## 2017-06-26 VITALS — BP 169/102 | HR 88 | Wt 147.0 lb

## 2017-06-26 DIAGNOSIS — E878 Other disorders of electrolyte and fluid balance, not elsewhere classified: Secondary | ICD-10-CM

## 2017-06-26 DIAGNOSIS — Z0000 Encounter for general adult medical examination without abnormal findings: Secondary | ICD-10-CM | POA: Diagnosis not present

## 2017-06-26 DIAGNOSIS — N138 Other obstructive and reflux uropathy: Secondary | ICD-10-CM | POA: Diagnosis not present

## 2017-06-26 DIAGNOSIS — Z1329 Encounter for screening for other suspected endocrine disorder: Secondary | ICD-10-CM

## 2017-06-26 DIAGNOSIS — I10 Essential (primary) hypertension: Secondary | ICD-10-CM | POA: Diagnosis not present

## 2017-06-26 DIAGNOSIS — K4090 Unilateral inguinal hernia, without obstruction or gangrene, not specified as recurrent: Secondary | ICD-10-CM | POA: Diagnosis not present

## 2017-06-26 DIAGNOSIS — F312 Bipolar disorder, current episode manic severe with psychotic features: Secondary | ICD-10-CM

## 2017-06-26 DIAGNOSIS — K2100 Gastro-esophageal reflux disease with esophagitis, without bleeding: Secondary | ICD-10-CM

## 2017-06-26 DIAGNOSIS — Z7189 Other specified counseling: Secondary | ICD-10-CM

## 2017-06-26 DIAGNOSIS — N401 Enlarged prostate with lower urinary tract symptoms: Secondary | ICD-10-CM

## 2017-06-26 DIAGNOSIS — Z1211 Encounter for screening for malignant neoplasm of colon: Secondary | ICD-10-CM | POA: Diagnosis not present

## 2017-06-26 LAB — LIPID PANEL W/O CHOL/HDL RATIO
CHOLESTEROL TOTAL: 189 mg/dL (ref 100–199)
HDL: 58 mg/dL (ref >39)
LDL CALC: 112 mg/dL — AB (ref 0–99)
TRIGLYCERIDES: 97 mg/dL (ref 0–149)
VLDL Cholesterol Cal: 19 mg/dL (ref 5–40)

## 2017-06-26 LAB — COMPREHENSIVE METABOLIC PANEL
ALBUMIN: 3.7 g/dL (ref 3.5–4.8)
ALK PHOS: 68 IU/L (ref 39–117)
ALT: 15 IU/L (ref 0–44)
AST: 22 IU/L (ref 0–40)
Albumin/Globulin Ratio: 1.3 (ref 1.2–2.2)
BUN / CREAT RATIO: 17 (ref 10–24)
BUN: 21 mg/dL (ref 8–27)
Bilirubin Total: 0.3 mg/dL (ref 0.0–1.2)
CO2: 24 mmol/L (ref 20–29)
CREATININE: 1.25 mg/dL (ref 0.76–1.27)
Calcium: 9.6 mg/dL (ref 8.6–10.2)
Chloride: 104 mmol/L (ref 96–106)
GFR calc Af Amer: 67 mL/min/{1.73_m2} (ref >59)
GFR calc non Af Amer: 58 mL/min/{1.73_m2} — ABNORMAL LOW (ref >59)
GLUCOSE: 86 mg/dL (ref 65–99)
Globulin, Total: 2.8 g/dL (ref 1.5–4.5)
Potassium: 4.4 mmol/L (ref 3.5–5.2)
Sodium: 141 mmol/L (ref 134–144)
Total Protein: 6.5 g/dL (ref 6.0–8.5)

## 2017-06-26 LAB — CBC WITH DIFFERENTIAL/PLATELET
Basophils Absolute: 0 10*3/uL (ref 0.0–0.2)
Basos: 0 %
EOS (ABSOLUTE): 0.1 10*3/uL (ref 0.0–0.4)
Eos: 2 %
HEMOGLOBIN: 14.6 g/dL (ref 13.0–17.7)
Hematocrit: 42.9 % (ref 37.5–51.0)
IMMATURE GRANS (ABS): 0 10*3/uL (ref 0.0–0.1)
Immature Granulocytes: 0 %
LYMPHS ABS: 2.1 10*3/uL (ref 0.7–3.1)
LYMPHS: 30 %
MCH: 31.7 pg (ref 26.6–33.0)
MCHC: 34 g/dL (ref 31.5–35.7)
MCV: 93 fL (ref 79–97)
MONOCYTES: 9 %
Monocytes Absolute: 0.6 10*3/uL (ref 0.1–0.9)
Neutrophils Absolute: 4.3 10*3/uL (ref 1.4–7.0)
Neutrophils: 59 %
PLATELETS: 207 10*3/uL (ref 150–379)
RBC: 4.6 x10E6/uL (ref 4.14–5.80)
RDW: 14.5 % (ref 12.3–15.4)
WBC: 7.3 10*3/uL (ref 3.4–10.8)

## 2017-06-26 LAB — VALPROIC ACID LEVEL: VALPROIC ACID LVL: 43 ug/mL — AB (ref 50–100)

## 2017-06-26 LAB — PSA: PROSTATE SPECIFIC AG, SERUM: 2.4 ng/mL (ref 0.0–4.0)

## 2017-06-26 LAB — TSH: TSH: 1.09 u[IU]/mL (ref 0.450–4.500)

## 2017-06-26 MED ORDER — OMEPRAZOLE 40 MG PO CPDR: 40.0000 mg | DELAYED_RELEASE_CAPSULE | Freq: Every day | ORAL | 4 refills | Status: DC

## 2017-06-26 MED ORDER — TAMSULOSIN HCL 0.4 MG PO CAPS: 0.4000 mg | ORAL_CAPSULE | Freq: Every day | ORAL | 4 refills | Status: DC

## 2017-06-26 NOTE — Assessment & Plan Note (Signed)
The current medical regimen is effective;  continue present plan and medications.  

## 2017-06-26 NOTE — Assessment & Plan Note (Signed)
A voluntary discussion about advance care planning including the explanation and discussion of advance directives was extensively discussed  with the patient.  Explanation about the health care proxy and Living will was reviewed and packet with forms with explanation of how to fill them out was given.    Patient was offered a separate Advance Care Planning visit for further assistance with forms.  Time spent:  16+ min      Individuals present. pt

## 2017-06-26 NOTE — Progress Notes (Signed)
BP (!) 169/102   Pulse 88   Wt 147 lb (66.7 kg)   SpO2 96%   BMI 23.73 kg/m    Subjective:    Patient ID: Tony Fowler, male    DOB: Feb 01, 1946, 72 y.o.   MRN: [id number]  HPI: Tony Fowler is a 72 y.o. male  Chief Complaint  Patient presents with  . Annual Exam  Patient follow-up doing well no complaints.  Working with neurology and behavioral health for good control of seizures and nerves with Abilify Depakote and gabapentin.  Not having any side effects and lower dose Depakote is really helped not falling anymore. Taking reflux medication without problems no more reflux symptoms. BPH doing well with Flomax and no urinary symptoms. Patient otherwise doing well except for aches and pains of growing older some left knee weakness banged his left great toe but otherwise doing well.  Relevant past medical, surgical, family and social history reviewed and updated as indicated. Interim medical history since our last visit reviewed. Allergies and medications reviewed and updated.  Review of Systems  Constitutional: Negative.   HENT: Negative.   Eyes: Negative.   Respiratory: Negative.   Cardiovascular: Negative.   Gastrointestinal: Negative.   Endocrine: Negative.   Genitourinary: Negative.   Musculoskeletal: Negative.   Skin: Negative.   Allergic/Immunologic: Negative.   Neurological: Negative.   Hematological: Negative.   Psychiatric/Behavioral: Negative.     Per HPI unless specifically indicated above     Objective:    BP (!) 169/102   Pulse 88   Wt 147 lb (66.7 kg)   SpO2 96%   BMI 23.73 kg/m   Wt Readings from Last 3 Encounters:  06/26/17 147 lb (66.7 kg)  06/25/17 148 lb 11.2 oz (67.4 kg)  01/02/17 140 lb 12.8 oz (63.9 kg)    Physical Exam  Constitutional: He is oriented to person, place, and time. He appears well-developed and well-nourished.  HENT:  Head: Normocephalic and atraumatic.  Right Ear: External ear normal.  Left Ear: External ear  normal.  Eyes: Pupils are equal, round, and reactive to light. Conjunctivae and EOM are normal.  Neck: Normal range of motion. Neck supple.  Cardiovascular: Normal rate, regular rhythm, normal heart sounds and intact distal pulses.  Pulmonary/Chest: Effort normal and breath sounds normal.  Abdominal: Soft. Bowel sounds are normal. There is no splenomegaly or hepatomegaly.  Genitourinary: Rectum normal and penis normal.  Genitourinary Comments: Rt Inguinal hernia very large BPH changes  Musculoskeletal: Normal range of motion.  Neurological: He is alert and oriented to person, place, and time. He has normal reflexes.  Skin: No rash noted. No erythema.  Psychiatric: He has a normal mood and affect. His behavior is normal. Judgment and thought content normal.    Results for orders placed or performed in visit on 06/25/17  CBC with Differential  Result Value Ref Range   WBC 7.3 3.4 - 10.8 x10E3/uL   RBC 4.60 4.14 - 5.80 x10E6/uL   Hemoglobin 14.6 13.0 - 17.7 g/dL   Hematocrit 42.9 37.5 - 51.0 %   MCV 93 79 - 97 fL   MCH 31.7 26.6 - 33.0 pg   MCHC 34.0 31.5 - 35.7 g/dL   RDW 14.5 12.3 - 15.4 %   Platelets 207 150 - 379 x10E3/uL   Neutrophils 59 Not Estab. %   Lymphs 30 Not Estab. %   Monocytes 9 Not Estab. %   Eos 2 Not Estab. %   Basos 0 Not Estab. %  Neutrophils Absolute 4.3 1.4 - 7.0 x10E3/uL   Lymphocytes Absolute 2.1 0.7 - 3.1 x10E3/uL   Monocytes Absolute 0.6 0.1 - 0.9 x10E3/uL   EOS (ABSOLUTE) 0.1 0.0 - 0.4 x10E3/uL   Basophils Absolute 0.0 0.0 - 0.2 x10E3/uL   Immature Granulocytes 0 Not Estab. %   Immature Grans (Abs) 0.0 0.0 - 0.1 x10E3/uL  Comp Met (CMET)  Result Value Ref Range   Glucose 86 65 - 99 mg/dL   BUN 21 8 - 27 mg/dL   Creatinine, Ser 1.25 0.76 - 1.27 mg/dL   GFR calc non Af Amer 58 (L) >59 mL/min/1.73   GFR calc Af Amer 67 >59 mL/min/1.73   BUN/Creatinine Ratio 17 10 - 24   Sodium 141 134 - 144 mmol/L   Potassium 4.4 3.5 - 5.2 mmol/L   Chloride 104  96 - 106 mmol/L   CO2 24 20 - 29 mmol/L   Calcium 9.6 8.6 - 10.2 mg/dL   Total Protein 6.5 6.0 - 8.5 g/dL   Albumin 3.7 3.5 - 4.8 g/dL   Globulin, Total 2.8 1.5 - 4.5 g/dL   Albumin/Globulin Ratio 1.3 1.2 - 2.2   Bilirubin Total 0.3 0.0 - 1.2 mg/dL   Alkaline Phosphatase 68 39 - 117 IU/L   AST 22 0 - 40 IU/L   ALT 15 0 - 44 IU/L  Urinalysis, Routine w reflex microscopic  Result Value Ref Range   Specific Gravity, UA <1.005 (L) 1.005 - 1.030   pH, UA 7.0 5.0 - 7.5   Color, UA Yellow Yellow   Appearance Ur Clear Clear   Leukocytes, UA Negative Negative   Protein, UA Negative Negative/Trace   Glucose, UA Negative Negative   Ketones, UA Negative Negative   RBC, UA Negative Negative   Bilirubin, UA Negative Negative   Urobilinogen, Ur 0.2 0.2 - 1.0 mg/dL   Nitrite, UA Negative Negative  TSH  Result Value Ref Range   TSH 1.090 0.450 - 4.500 uIU/mL  PSA  Result Value Ref Range   Prostate Specific Ag, Serum 2.4 0.0 - 4.0 ng/mL  Lipid Panel w/o Chol/HDL Ratio  Result Value Ref Range   Cholesterol, Total 189 100 - 199 mg/dL   Triglycerides 97 0 - 149 mg/dL   HDL 58 >39 mg/dL   VLDL Cholesterol Cal 19 5 - 40 mg/dL   LDL Calculated 112 (H) 0 - 99 mg/dL  Valproic Acid level  Result Value Ref Range   Valproic Acid Lvl 43 (L) 50 - 100 ug/mL      Assessment & Plan:   Problem List Items Addressed This Visit      Cardiovascular and Mediastinum   Benign hypertension - Primary (Chronic)     Digestive   Reflux esophagitis    The current medical regimen is effective;  continue present plan and medications.       Relevant Medications   omeprazole (PRILOSEC) 40 MG capsule     Genitourinary   BPH with obstruction/lower urinary tract symptoms    The current medical regimen is effective;  continue present plan and medications.       Relevant Medications   tamsulosin (FLOMAX) 0.4 MG CAPS capsule     Other   Bipolar affective disorder, manic, severe, with psychotic behavior  ([place])    The current medical regimen is effective;  continue present plan and medications.       Inguinal hernia    The current medical regimen is effective;  continue present plan and medications.  Hyperchloremia   Advanced care planning/counseling discussion    A voluntary discussion about advance care planning including the explanation and discussion of advance directives was extensively discussed  with the patient.  Explanation about the health care proxy and Living will was reviewed and packet with forms with explanation of how to fill them out was given.    Patient was offered a separate [place] visit for further assistance with forms.  Time spent:  16+ min      Individuals present. pt        Other Visit Diagnoses    Thyroid disorder screen       Colon cancer screening       Relevant Orders   Ambulatory referral to Gastroenterology    Pressure elevated today was good yesterday and good on home checking.  Patient will continue checking at home if staying elevated will make an appointment.  Follow up plan: Return in about 6 months (around 12/27/2017) for BMP.

## 2017-07-03 ENCOUNTER — Encounter: Payer: Self-pay | Admitting: Family Medicine

## 2017-07-09 ENCOUNTER — Encounter: Payer: Self-pay | Admitting: *Deleted

## 2017-08-15 DIAGNOSIS — F3113 Bipolar disorder, current episode manic without psychotic features, severe: Secondary | ICD-10-CM | POA: Diagnosis not present

## 2017-09-02 ENCOUNTER — Ambulatory Visit: Payer: Self-pay

## 2017-09-02 ENCOUNTER — Ambulatory Visit: Payer: Self-pay | Admitting: Family Medicine

## 2017-09-02 NOTE — Telephone Encounter (Signed)
Pt. Reports he checked his BP at [facility] this morning, after walking there, and got 180/107  And  171/98 at 10:00 this morning. Denies any symptoms. Reports "They took me off my BP medication when they started me on Depakote." Does not have a Home BP monitor. Request an appointment tomorrow. Spoke to [facility] and will send triage over for review. Instructed pt. To go to ED if he develops symptoms - headache , blurred vision. Reason for Disposition . Systolic BP  >= 180 OR Diastolic >= 110  Answer Assessment - Initial Assessment Questions 1. BLOOD PRESSURE: "What is the blood pressure?" "Did you take at least two measurements 5 minutes apart?"     180/107    171/98   10:00 2. ONSET: "When did you take your blood pressure?"     10:00 3. HOW: "How did you obtain the blood pressure?" (e.g., visiting nurse, automatic home BP monitor)     At [facility] 4. HISTORY: "Do you have a history of high blood pressure?"     Yes - but they stopped my medication. 5. MEDICATIONS: "Are you taking any medications for blood pressure?" "Have you missed any doses recently?"     I don't take it anymore. 6. OTHER SYMPTOMS: "Do you have any symptoms?" (e.g., headache, chest pain, blurred vision, difficulty breathing, weakness)     No symptoms 7. PREGNANCY: "Is there any chance you are pregnant?" "When was your last menstrual period?"     No  Protocols used: HIGH BLOOD PRESSURE-A-AH

## 2017-09-02 NOTE — Telephone Encounter (Signed)
Needs appointment

## 2017-09-02 NOTE — Telephone Encounter (Signed)
Pt calling to report BP 183/107 taken this am at [employer]. Pt stated that he took his BP at [employer] after walking a mile to get there.  Pt stated he took it a total of 3 times but cannot remember the readings. Pt denies headache, chest pain, blurred vision, difficulty breathing or any weakness. Pt stated that when he is driven to [employer] his BP's are normal. Disposition recommended pt to see a physician within 24 hours. No appts available. Routing note back to office high priority. Care advice given per protocol. Pt advised to purchase a home BP monitor and to sit quietly for 5 minutes in a air conditioned room before he takes his BP.  Reason for Disposition . Systolic BP  >= 180 OR Diastolic >= 110  Answer Assessment - Initial Assessment Questions 1. BLOOD PRESSURE: "What is the blood pressure?" "Did you take at least two measurements 5 minutes apart?"     183/107 2. ONSET: "When did you take your blood pressure?"    1030 am  3. HOW: "How did you obtain the blood pressure?" (e.g., visiting nurse, automatic home BP monitor)  [facility] 4. HISTORY: "Do you have a history of high blood pressure?"     no 5. MEDICATIONS: "Are you taking any medications for blood pressure?" "Have you missed any doses recently?"     No  6. OTHER SYMPTOMS: "Do you have any symptoms?" (e.g., headache, chest pain, blurred vision, difficulty breathing, weakness)     no 7. PREGNANCY: "Is there any chance you are pregnant?" "When was your last menstrual period?"     n/a  Protocols used: HIGH BLOOD PRESSURE-A-AH

## 2017-09-03 ENCOUNTER — Encounter: Payer: Self-pay | Admitting: Unknown Physician Specialty

## 2017-09-03 ENCOUNTER — Ambulatory Visit (INDEPENDENT_AMBULATORY_CARE_PROVIDER_SITE_OTHER): Payer: Medicare Other | Admitting: Unknown Physician Specialty

## 2017-09-03 DIAGNOSIS — I10 Essential (primary) hypertension: Secondary | ICD-10-CM | POA: Diagnosis not present

## 2017-09-03 MED ORDER — LISINOPRIL 10 MG PO TABS: 10.0000 mg | ORAL_TABLET | Freq: Every day | ORAL | 3 refills | Status: DC

## 2017-09-03 NOTE — Telephone Encounter (Signed)
Looks like he's seeing you.

## 2017-09-03 NOTE — Telephone Encounter (Signed)
Called patient to check on his BP today. Pt stated it is running in the mid 150s/90s. Pt stated he is coming in today to see Dr. Laural Benes @ 1

## 2017-09-03 NOTE — Progress Notes (Signed)
BP (!) 148/85 (BP Location: Left Arm, Cuff Size: Normal)   Pulse 99   Temp 98.3 F (36.8 C) (Oral)   Wt 141 lb 3.2 oz (64 kg)   SpO2 96%   BMI 22.79 kg/m    Subjective:    Patient ID: Tony Fowler, male    DOB: [birth year], 72 y.o.   MRN: [id number]  HPI: Tony Fowler is a 72 y.o. male  Chief Complaint  Patient presents with  . Hypertension    pt states his BP was around 180/110   Hypertension Average home BPs Checked at [facility] and was high.  Bought his own BP cuff with SBP being around 165.     No problems or lightheadedness No chest pain with exertion or shortness of breath No Edema Reviewed chart and high last visit but no BP medication started at the time.  Labs reviewed showin a GFR of 58  : Relevant past medical, surgical, family and social history reviewed and updated as indicated. Interim medical history since our last visit reviewed. Allergies and medications reviewed and updated.  Review of Systems  Constitutional: Negative.   Respiratory: Negative.   Cardiovascular: Negative.   Gastrointestinal: Negative.   Genitourinary: Negative.     Per HPI unless specifically indicated above     Objective:    BP (!) 148/85 (BP Location: Left Arm, Cuff Size: Normal)   Pulse 99   Temp 98.3 F (36.8 C) (Oral)   Wt 141 lb 3.2 oz (64 kg)   SpO2 96%   BMI 22.79 kg/m   Wt Readings from Last 3 Encounters:  09/03/17 141 lb 3.2 oz (64 kg)  06/26/17 147 lb (66.7 kg)  06/25/17 148 lb 11.2 oz (67.4 kg)    Physical Exam  Constitutional: He is oriented to person, place, and time. He appears well-developed and well-nourished. No distress.  HENT:  Head: Normocephalic and atraumatic.  Eyes: Conjunctivae and lids are normal. Right eye exhibits no discharge. Left eye exhibits no discharge. No scleral icterus.  Neck: Normal range of motion. Neck supple. No JVD present. Carotid bruit is not present.  Cardiovascular: Normal rate, regular rhythm and normal heart sounds.    Pulmonary/Chest: Effort normal and breath sounds normal. No respiratory distress.  Abdominal: Normal appearance. There is no splenomegaly or hepatomegaly.  Musculoskeletal: Normal range of motion.  Neurological: He is alert and oriented to person, place, and time.  Skin: Skin is warm, dry and intact. No rash noted. No pallor.  Psychiatric: He has a normal mood and affect. His behavior is normal. Judgment and thought content normal.    Results for orders placed or performed in visit on 06/25/17  CBC with Differential  Result Value Ref Range   WBC 7.3 3.4 - 10.8 x10E3/uL   RBC 4.60 4.14 - 5.80 x10E6/uL   Hemoglobin 14.6 13.0 - 17.7 g/dL   Hematocrit 42.9 37.5 - 51.0 %   MCV 93 79 - 97 fL   MCH 31.7 26.6 - 33.0 pg   MCHC 34.0 31.5 - 35.7 g/dL   RDW 14.5 12.3 - 15.4 %   Platelets 207 150 - 379 x10E3/uL   Neutrophils 59 Not Estab. %   Lymphs 30 Not Estab. %   Monocytes 9 Not Estab. %   Eos 2 Not Estab. %   Basos 0 Not Estab. %   Neutrophils Absolute 4.3 1.4 - 7.0 x10E3/uL   Lymphocytes Absolute 2.1 0.7 - 3.1 x10E3/uL   Monocytes Absolute 0.6 0.1 - 0.9 x10E3/uL  EOS (ABSOLUTE) 0.1 0.0 - 0.4 x10E3/uL   Basophils Absolute 0.0 0.0 - 0.2 x10E3/uL   Immature Granulocytes 0 Not Estab. %   Immature Grans (Abs) 0.0 0.0 - 0.1 x10E3/uL  Comp Met (CMET)  Result Value Ref Range   Glucose 86 65 - 99 mg/dL   BUN 21 8 - 27 mg/dL   Creatinine, Ser 1.25 0.76 - 1.27 mg/dL   GFR calc non Af Amer 58 (L) >59 mL/min/1.73   GFR calc Af Amer 67 >59 mL/min/1.73   BUN/Creatinine Ratio 17 10 - 24   Sodium 141 134 - 144 mmol/L   Potassium 4.4 3.5 - 5.2 mmol/L   Chloride 104 96 - 106 mmol/L   CO2 24 20 - 29 mmol/L   Calcium 9.6 8.6 - 10.2 mg/dL   Total Protein 6.5 6.0 - 8.5 g/dL   Albumin 3.7 3.5 - 4.8 g/dL   Globulin, Total 2.8 1.5 - 4.5 g/dL   Albumin/Globulin Ratio 1.3 1.2 - 2.2   Bilirubin Total 0.3 0.0 - 1.2 mg/dL   Alkaline Phosphatase 68 39 - 117 IU/L   AST 22 0 - 40 IU/L   ALT 15 0 - 44  IU/L  Urinalysis, Routine w reflex microscopic  Result Value Ref Range   Specific Gravity, UA <1.005 (L) 1.005 - 1.030   pH, UA 7.0 5.0 - 7.5   Color, UA Yellow Yellow   Appearance Ur Clear Clear   Leukocytes, UA Negative Negative   Protein, UA Negative Negative/Trace   Glucose, UA Negative Negative   Ketones, UA Negative Negative   RBC, UA Negative Negative   Bilirubin, UA Negative Negative   Urobilinogen, Ur 0.2 0.2 - 1.0 mg/dL   Nitrite, UA Negative Negative  TSH  Result Value Ref Range   TSH 1.090 0.450 - 4.500 uIU/mL  PSA  Result Value Ref Range   Prostate Specific Ag, Serum 2.4 0.0 - 4.0 ng/mL  Lipid Panel w/o Chol/HDL Ratio  Result Value Ref Range   Cholesterol, Total 189 100 - 199 mg/dL   Triglycerides 97 0 - 149 mg/dL   HDL 58 >39 mg/dL   VLDL Cholesterol Cal 19 5 - 40 mg/dL   LDL Calculated 112 (H) 0 - 99 mg/dL  Valproic Acid level  Result Value Ref Range   Valproic Acid Lvl 43 (L) 50 - 100 ug/mL      Assessment & Plan:   Problem List Items Addressed This Visit      Unprioritized   Benign hypertension (Chronic)    New problem and diagnosis.  Start Lisinopril 10 mg daily.  Recheck in 1 month for BMP or CMP.  Discussed DASH diet. Printout given      Relevant Medications   lisinopril (PRINIVIL,ZESTRIL) 10 MG tablet       Follow up plan: Return in about 1 month (around 10/01/2017) for Me or MAC.

## 2017-09-03 NOTE — Patient Instructions (Signed)
DASH Eating Plan DASH stands for "Dietary Approaches to Stop Hypertension." The DASH eating plan is a healthy eating plan that has been shown to reduce high blood pressure (hypertension). It may also reduce your risk for type 2 diabetes, heart disease, and stroke. The DASH eating plan may also help with weight loss. What are tips for following this plan? General guidelines  Avoid eating more than 2,300 mg (milligrams) of salt (sodium) a day. If you have hypertension, you may need to reduce your sodium intake to 1,500 mg a day.  Limit alcohol intake to no more than 1 drink a day for nonpregnant women and 2 drinks a day for men. One drink equals 12 oz of beer, 5 oz of wine, or 1 oz of hard liquor.  Work with your health care provider to maintain a healthy body weight or to lose weight. Ask what an ideal weight is for you.  Get at least 30 minutes of exercise that causes your heart to beat faster (aerobic exercise) most days of the week. Activities may include walking, swimming, or biking.  Work with your health care provider or diet and nutrition specialist (dietitian) to adjust your eating plan to your individual calorie needs. Reading food labels  Check food labels for the amount of sodium per serving. Choose foods with less than 5 percent of the Daily Value of sodium. Generally, foods with less than 300 mg of sodium per serving fit into this eating plan.  To find whole grains, look for the word "whole" as the first word in the ingredient list. Shopping  Buy products labeled as "low-sodium" or "no salt added."  Buy fresh foods. Avoid canned foods and premade or frozen meals. Cooking  Avoid adding salt when cooking. Use salt-free seasonings or herbs instead of table salt or sea salt. Check with your health care provider or pharmacist before using salt substitutes.  Do not fry foods. Cook foods using healthy methods such as baking, boiling, grilling, and broiling instead.  Cook with  heart-healthy oils, such as olive, canola, soybean, or sunflower oil. Meal planning   Eat a balanced diet that includes: ? 5 or more servings of fruits and vegetables each day. At each meal, try to fill half of your plate with fruits and vegetables. ? Up to 6-8 servings of whole grains each day. ? Less than 6 oz of lean meat, poultry, or fish each day. A 3-oz serving of meat is about the same size as a deck of cards. One egg equals 1 oz. ? 2 servings of low-fat dairy each day. ? A serving of nuts, seeds, or beans 5 times each week. ? Heart-healthy fats. Healthy fats called Omega-3 fatty acids are found in foods such as flaxseeds and coldwater fish, like sardines, salmon, and mackerel.  Limit how much you eat of the following: ? Canned or prepackaged foods. ? Food that is high in trans fat, such as fried foods. ? Food that is high in saturated fat, such as fatty meat. ? Sweets, desserts, sugary drinks, and other foods with added sugar. ? Full-fat dairy products.  Do not salt foods before eating.  Try to eat at least 2 vegetarian meals each week.  Eat more home-cooked food and less restaurant, buffet, and fast food.  When eating at a restaurant, ask that your food be prepared with less salt or no salt, if possible. What foods are recommended? The items listed may not be a complete list. Talk with your dietitian about what   dietary choices are best for you. Grains Whole-grain or whole-wheat bread. Whole-grain or whole-wheat pasta. Brown rice. Oatmeal. Quinoa. Bulgur. Whole-grain and low-sodium cereals. Pita bread. Low-fat, low-sodium crackers. Whole-wheat flour tortillas. Vegetables Fresh or frozen vegetables (raw, steamed, roasted, or grilled). Low-sodium or reduced-sodium tomato and vegetable juice. Low-sodium or reduced-sodium tomato sauce and tomato paste. Low-sodium or reduced-sodium canned vegetables. Fruits All fresh, dried, or frozen fruit. Canned fruit in natural juice (without  added sugar). Meat and other protein foods Skinless chicken or turkey. Ground chicken or turkey. Pork with fat trimmed off. Fish and seafood. Egg whites. Dried beans, peas, or lentils. Unsalted nuts, nut butters, and seeds. Unsalted canned beans. Lean cuts of beef with fat trimmed off. Low-sodium, lean deli meat. Dairy Low-fat (1%) or fat-free (skim) milk. Fat-free, low-fat, or reduced-fat cheeses. Nonfat, low-sodium ricotta or cottage cheese. Low-fat or nonfat yogurt. Low-fat, low-sodium cheese. Fats and oils Soft margarine without trans fats. Vegetable oil. Low-fat, reduced-fat, or light mayonnaise and salad dressings (reduced-sodium). Canola, safflower, olive, soybean, and sunflower oils. Avocado. Seasoning and other foods Herbs. Spices. Seasoning mixes without salt. Unsalted popcorn and pretzels. Fat-free sweets. What foods are not recommended? The items listed may not be a complete list. Talk with your dietitian about what dietary choices are best for you. Grains Baked goods made with fat, such as croissants, muffins, or some breads. Dry pasta or rice meal packs. Vegetables Creamed or fried vegetables. Vegetables in a cheese sauce. Regular canned vegetables (not low-sodium or reduced-sodium). Regular canned tomato sauce and paste (not low-sodium or reduced-sodium). Regular tomato and vegetable juice (not low-sodium or reduced-sodium). Pickles. Olives. Fruits Canned fruit in a light or heavy syrup. Fried fruit. Fruit in cream or butter sauce. Meat and other protein foods Fatty cuts of meat. Ribs. Fried meat. Bacon. Sausage. Bologna and other processed lunch meats. Salami. Fatback. Hotdogs. Bratwurst. Salted nuts and seeds. Canned beans with added salt. Canned or smoked fish. Whole eggs or egg yolks. Chicken or turkey with skin. Dairy Whole or 2% milk, cream, and half-and-half. Whole or full-fat cream cheese. Whole-fat or sweetened yogurt. Full-fat cheese. Nondairy creamers. Whipped toppings.  Processed cheese and cheese spreads. Fats and oils Butter. Stick margarine. Lard. Shortening. Ghee. Bacon fat. Tropical oils, such as coconut, palm kernel, or palm oil. Seasoning and other foods Salted popcorn and pretzels. Onion salt, garlic salt, seasoned salt, table salt, and sea salt. Worcestershire sauce. Tartar sauce. Barbecue sauce. Teriyaki sauce. Soy sauce, including reduced-sodium. Steak sauce. Canned and packaged gravies. Fish sauce. Oyster sauce. Cocktail sauce. Horseradish that you find on the shelf. Ketchup. Mustard. Meat flavorings and tenderizers. Bouillon cubes. Hot sauce and Tabasco sauce. Premade or packaged marinades. Premade or packaged taco seasonings. Relishes. Regular salad dressings. Where to find more information:  National Heart, Lung, and Blood Institute: www.nhlbi.nih.gov  American Heart Association: www.heart.org Summary  The DASH eating plan is a healthy eating plan that has been shown to reduce high blood pressure (hypertension). It may also reduce your risk for type 2 diabetes, heart disease, and stroke.  With the DASH eating plan, you should limit salt (sodium) intake to 2,300 mg a day. If you have hypertension, you may need to reduce your sodium intake to 1,500 mg a day.  When on the DASH eating plan, aim to eat more fresh fruits and vegetables, whole grains, lean proteins, low-fat dairy, and heart-healthy fats.  Work with your health care provider or diet and nutrition specialist (dietitian) to adjust your eating plan to your individual   calorie needs. This information is not intended to replace advice given to you by your health care provider. Make sure you discuss any questions you have with your health care provider. Document Released: 03/14/2011 Document Revised: 03/18/2016 Document Reviewed: 03/18/2016 Elsevier Interactive Patient Education  2018 [employer] Inc.  

## 2017-09-03 NOTE — Assessment & Plan Note (Addendum)
New problem and diagnosis.  Start Lisinopril 10 mg daily.  Recheck in 1 month for BMP or CMP.  Discussed DASH diet. Printout given

## 2017-09-10 ENCOUNTER — Ambulatory Visit: Payer: Medicare Other | Admitting: Family Medicine

## 2017-10-15 ENCOUNTER — Ambulatory Visit (INDEPENDENT_AMBULATORY_CARE_PROVIDER_SITE_OTHER): Payer: Medicare Other | Admitting: Unknown Physician Specialty

## 2017-10-15 ENCOUNTER — Encounter: Payer: Self-pay | Admitting: Unknown Physician Specialty

## 2017-10-15 DIAGNOSIS — I10 Essential (primary) hypertension: Secondary | ICD-10-CM

## 2017-10-15 NOTE — Progress Notes (Signed)
BP 136/80 (BP Location: Left Arm)   Pulse 88   Temp 98.7 F (37.1 C) (Oral)   Ht 5' 6" (1.676 m)   Wt 141 lb 6.4 oz (64.1 kg)   SpO2 97%   BMI 22.82 kg/m    Subjective:    Patient ID: Tony Fowler, male    DOB: Aug 14, 1945, 72 y.o.   MRN: [id number]  HPI: Tony Fowler is a 72 y.o. male  Chief Complaint  Patient presents with  . Hypertension   Hypertension Using medications without difficulty.  BP is around 160/100 in the AM and goes down through the day at 140/80s in the afternoon and 120/80 in the evening.  Pt has a couple of concerns:  Interaction with Depacote as was told not to take BP meds with Depacote and a concern with going too low at night if medication is increased.  No problems or lightheadedness No chest pain with exertion or shortness of breath No Edema   Relevant past medical, surgical, family and social history reviewed and updated as indicated. Interim medical history since our last visit reviewed. Allergies and medications reviewed and updated.  Review of Systems  Per HPI unless specifically indicated above     Objective:    BP 136/80 (BP Location: Left Arm)   Pulse 88   Temp 98.7 F (37.1 C) (Oral)   Ht 5' 6" (1.676 m)   Wt 141 lb 6.4 oz (64.1 kg)   SpO2 97%   BMI 22.82 kg/m   Wt Readings from Last 3 Encounters:  10/15/17 141 lb 6.4 oz (64.1 kg)  09/03/17 141 lb 3.2 oz (64 kg)  06/26/17 147 lb (66.7 kg)    Physical Exam  Constitutional: He is oriented to person, place, and time. He appears well-developed and well-nourished. No distress.  HENT:  Head: Normocephalic and atraumatic.  Eyes: Conjunctivae and lids are normal. Right eye exhibits no discharge. Left eye exhibits no discharge. No scleral icterus.  Neck: Normal range of motion. Neck supple. No JVD present. Carotid bruit is not present.  Cardiovascular: Normal rate, regular rhythm and normal heart sounds.  Pulmonary/Chest: Effort normal and breath sounds normal. No respiratory  distress.  Abdominal: Normal appearance. There is no splenomegaly or hepatomegaly.  Musculoskeletal: Normal range of motion.  Neurological: He is alert and oriented to person, place, and time.  Skin: Skin is warm, dry and intact. No rash noted. No pallor.  Psychiatric: He has a normal mood and affect. His behavior is normal. Judgment and thought content normal.    Results for orders placed or performed in visit on 06/25/17  CBC with Differential  Result Value Ref Range   WBC 7.3 3.4 - 10.8 x10E3/uL   RBC 4.60 4.14 - 5.80 x10E6/uL   Hemoglobin 14.6 13.0 - 17.7 g/dL   Hematocrit 42.9 37.5 - 51.0 %   MCV 93 79 - 97 fL   MCH 31.7 26.6 - 33.0 pg   MCHC 34.0 31.5 - 35.7 g/dL   RDW 14.5 12.3 - 15.4 %   Platelets 207 150 - 379 x10E3/uL   Neutrophils 59 Not Estab. %   Lymphs 30 Not Estab. %   Monocytes 9 Not Estab. %   Eos 2 Not Estab. %   Basos 0 Not Estab. %   Neutrophils Absolute 4.3 1.4 - 7.0 x10E3/uL   Lymphocytes Absolute 2.1 0.7 - 3.1 x10E3/uL   Monocytes Absolute 0.6 0.1 - 0.9 x10E3/uL   EOS (ABSOLUTE) 0.1 0.0 - 0.4 x10E3/uL  Basophils Absolute 0.0 0.0 - 0.2 x10E3/uL   Immature Granulocytes 0 Not Estab. %   Immature Grans (Abs) 0.0 0.0 - 0.1 x10E3/uL  Comp Met (CMET)  Result Value Ref Range   Glucose 86 65 - 99 mg/dL   BUN 21 8 - 27 mg/dL   Creatinine, Ser 1.25 0.76 - 1.27 mg/dL   GFR calc non Af Amer 58 (L) >59 mL/min/1.73   GFR calc Af Amer 67 >59 mL/min/1.73   BUN/Creatinine Ratio 17 10 - 24   Sodium 141 134 - 144 mmol/L   Potassium 4.4 3.5 - 5.2 mmol/L   Chloride 104 96 - 106 mmol/L   CO2 24 20 - 29 mmol/L   Calcium 9.6 8.6 - 10.2 mg/dL   Total Protein 6.5 6.0 - 8.5 g/dL   Albumin 3.7 3.5 - 4.8 g/dL   Globulin, Total 2.8 1.5 - 4.5 g/dL   Albumin/Globulin Ratio 1.3 1.2 - 2.2   Bilirubin Total 0.3 0.0 - 1.2 mg/dL   Alkaline Phosphatase 68 39 - 117 IU/L   AST 22 0 - 40 IU/L   ALT 15 0 - 44 IU/L  Urinalysis, Routine w reflex microscopic  Result Value Ref Range    Specific Gravity, UA <1.005 (L) 1.005 - 1.030   pH, UA 7.0 5.0 - 7.5   Color, UA Yellow Yellow   Appearance Ur Clear Clear   Leukocytes, UA Negative Negative   Protein, UA Negative Negative/Trace   Glucose, UA Negative Negative   Ketones, UA Negative Negative   RBC, UA Negative Negative   Bilirubin, UA Negative Negative   Urobilinogen, Ur 0.2 0.2 - 1.0 mg/dL   Nitrite, UA Negative Negative  TSH  Result Value Ref Range   TSH 1.090 0.450 - 4.500 uIU/mL  PSA  Result Value Ref Range   Prostate Specific Ag, Serum 2.4 0.0 - 4.0 ng/mL  Lipid Panel w/o Chol/HDL Ratio  Result Value Ref Range   Cholesterol, Total 189 100 - 199 mg/dL   Triglycerides 97 0 - 149 mg/dL   HDL 58 >39 mg/dL   VLDL Cholesterol Cal 19 5 - 40 mg/dL   LDL Calculated 112 (H) 0 - 99 mg/dL  Valproic Acid level  Result Value Ref Range   Valproic Acid Lvl 43 (L) 50 - 100 ug/mL      Assessment & Plan:   Problem List Items Addressed This Visit      Unprioritized   Benign hypertension (Chronic)    BP is improved with 136/80 on recheck.  High AM sugars but pt anxious about increasing medications at this time.  Will change th medication to evening dosing.  No drug interactions with Depakote found.            Follow up plan: Return for appt with Dr. Crissman Oct 3.      

## 2017-10-15 NOTE — Assessment & Plan Note (Addendum)
BP is improved with 136/80 on recheck.  High AM sugars but pt anxious about increasing medications at this time.  Will change th medication to evening dosing.  No drug interactions with Depakote found.

## 2017-10-16 LAB — COMPREHENSIVE METABOLIC PANEL
A/G RATIO: 1.6 (ref 1.2–2.2)
ALT: 12 IU/L (ref 0–44)
AST: 19 IU/L (ref 0–40)
Albumin: 4.1 g/dL (ref 3.5–4.8)
Alkaline Phosphatase: 64 IU/L (ref 39–117)
BUN/Creatinine Ratio: 17 (ref 10–24)
BUN: 21 mg/dL (ref 8–27)
Bilirubin Total: 0.3 mg/dL (ref 0.0–1.2)
CALCIUM: 9.8 mg/dL (ref 8.6–10.2)
CHLORIDE: 106 mmol/L (ref 96–106)
CO2: 23 mmol/L (ref 20–29)
Creatinine, Ser: 1.23 mg/dL (ref 0.76–1.27)
GFR calc Af Amer: 68 mL/min/{1.73_m2} (ref >59)
GFR calc non Af Amer: 59 mL/min/{1.73_m2} — ABNORMAL LOW (ref >59)
GLOBULIN, TOTAL: 2.5 g/dL (ref 1.5–4.5)
Glucose: 75 mg/dL (ref 65–99)
POTASSIUM: 4.9 mmol/L (ref 3.5–5.2)
Sodium: 142 mmol/L (ref 134–144)
Total Protein: 6.6 g/dL (ref 6.0–8.5)

## 2017-10-17 ENCOUNTER — Encounter: Payer: Self-pay | Admitting: Unknown Physician Specialty

## 2017-11-13 DIAGNOSIS — F3113 Bipolar disorder, current episode manic without psychotic features, severe: Secondary | ICD-10-CM | POA: Diagnosis not present

## 2017-11-14 DIAGNOSIS — F3113 Bipolar disorder, current episode manic without psychotic features, severe: Secondary | ICD-10-CM | POA: Diagnosis not present

## 2017-12-16 DIAGNOSIS — H9191 Unspecified hearing loss, right ear: Secondary | ICD-10-CM | POA: Diagnosis not present

## 2017-12-16 DIAGNOSIS — G251 Drug-induced tremor: Secondary | ICD-10-CM | POA: Diagnosis not present

## 2017-12-16 DIAGNOSIS — F3289 Other specified depressive episodes: Secondary | ICD-10-CM | POA: Diagnosis not present

## 2018-01-02 DIAGNOSIS — J0100 Acute maxillary sinusitis, unspecified: Secondary | ICD-10-CM | POA: Diagnosis not present

## 2018-01-02 DIAGNOSIS — H6981 Other specified disorders of Eustachian tube, right ear: Secondary | ICD-10-CM | POA: Diagnosis not present

## 2018-01-08 ENCOUNTER — Encounter: Payer: Self-pay | Admitting: Family Medicine

## 2018-01-08 ENCOUNTER — Ambulatory Visit (INDEPENDENT_AMBULATORY_CARE_PROVIDER_SITE_OTHER): Payer: Medicare Other | Admitting: Family Medicine

## 2018-01-08 VITALS — BP 138/86 | HR 85 | Temp 98.7°F | Ht 66.0 in | Wt 140.5 lb

## 2018-01-08 DIAGNOSIS — R251 Tremor, unspecified: Secondary | ICD-10-CM

## 2018-01-08 DIAGNOSIS — I10 Essential (primary) hypertension: Secondary | ICD-10-CM | POA: Diagnosis not present

## 2018-01-08 DIAGNOSIS — G8929 Other chronic pain: Secondary | ICD-10-CM | POA: Diagnosis not present

## 2018-01-08 DIAGNOSIS — Z23 Encounter for immunization: Secondary | ICD-10-CM

## 2018-01-08 DIAGNOSIS — M25562 Pain in left knee: Secondary | ICD-10-CM

## 2018-01-08 MED ORDER — LISINOPRIL 10 MG PO TABS: 10.0000 mg | ORAL_TABLET | Freq: Every day | ORAL | 1 refills | Status: DC

## 2018-01-08 NOTE — Assessment & Plan Note (Signed)
Followed by nurology

## 2018-01-08 NOTE — Assessment & Plan Note (Signed)
The current medical regimen is effective;  continue present plan and medications.  

## 2018-01-08 NOTE — Assessment & Plan Note (Signed)
Discussed knee pain issue will refer to orthopedics to further evaluate

## 2018-01-08 NOTE — Progress Notes (Signed)
BP 138/86 (BP Location: Left Arm)   Pulse 85   Temp 98.7 F (37.1 C)   Ht 5\' 6"  (1.676 m)   Wt 140 lb 8 oz (63.7 kg)   SpO2 99%   BMI 22.68 kg/m    Subjective:    Patient ID: Tony Fowler, male    DOB: 1946-02-25, 72 y.o.   MRN: [id number]  HPI: Tony Fowler is a 72 y.o. male  Chief Complaint  Patient presents with  . Hypertension  Patient follow-up hypertension doing well no complaints from blood pressure medications.  Taking lisinopril without problems and faithfully. Blood pressures been good had ear nose and throat doctor has been working with neurologist for tremor is starting new medications to see if that may help.  So far no change but is just started these changes. Patient working with ear nose and throat on some fluid behind his ear which may be exacerbated by dental causes getting ready to see a dentist sometime this fall.  Patient also with some left knee difficulty wears a knee brace because it feels like without the knee brace his left patella moves an uncontrollable fashion and may be at increased risk for falling as a consequence he walks very slowly.  His knee does not seem to really hurt click or lock but just is uncomfortable and feels more comfortable in a knee brace.  Relevant past medical, surgical, family and social history reviewed and updated as indicated. Interim medical history since our last visit reviewed. Allergies and medications reviewed and updated.  Review of Systems  Constitutional: Negative.   Respiratory: Negative.   Cardiovascular: Negative.     Per HPI unless specifically indicated above     Objective:    BP 138/86 (BP Location: Left Arm)   Pulse 85   Temp 98.7 F (37.1 C)   Ht 5\' 6"  (1.676 m)   Wt 140 lb 8 oz (63.7 kg)   SpO2 99%   BMI 22.68 kg/m   Wt Readings from Last 3 Encounters:  01/08/18 140 lb 8 oz (63.7 kg)  10/15/17 141 lb 6.4 oz (64.1 kg)  09/03/17 141 lb 3.2 oz (64 kg)    Physical Exam  Constitutional: He  is oriented to person, place, and time. He appears well-developed and well-nourished.  HENT:  Head: Normocephalic and atraumatic.  Eyes: Conjunctivae and EOM are normal.  Neck: Normal range of motion.  Cardiovascular: Normal rate, regular rhythm and normal heart sounds.  Pulmonary/Chest: Effort normal and breath sounds normal.  Musculoskeletal: Normal range of motion.  Neurological: He is alert and oriented to person, place, and time.  Skin: No erythema.  Psychiatric: He has a normal mood and affect. His behavior is normal. Judgment and thought content normal.    Results for orders placed or performed in visit on 10/15/17  Comprehensive metabolic panel  Result Value Ref Range   Glucose 75 65 - 99 mg/dL   BUN 21 8 - 27 mg/dL   Creatinine, Ser 4.09 0.76 - 1.27 mg/dL   GFR calc non Af Amer 59 (L) >59 mL/min/1.73   GFR calc Af Amer 68 >59 mL/min/1.73   BUN/Creatinine Ratio 17 10 - 24   Sodium 142 134 - 144 mmol/L   Potassium 4.9 3.5 - 5.2 mmol/L   Chloride 106 96 - 106 mmol/L   CO2 23 20 - 29 mmol/L   Calcium 9.8 8.6 - 10.2 mg/dL   Total Protein 6.6 6.0 - 8.5 g/dL   Albumin 4.1 3.5 -  4.8 g/dL   Globulin, Total 2.5 1.5 - 4.5 g/dL   Albumin/Globulin Ratio 1.6 1.2 - 2.2   Bilirubin Total 0.3 0.0 - 1.2 mg/dL   Alkaline Phosphatase 64 39 - 117 IU/L   AST 19 0 - 40 IU/L   ALT 12 0 - 44 IU/L      Assessment & Plan:   Problem List Items Addressed This Visit      Cardiovascular and Mediastinum   Benign hypertension - Primary (Chronic)    The current medical regimen is effective;  continue present plan and medications.       Relevant Medications   lisinopril (PRINIVIL,ZESTRIL) 10 MG tablet   Other Relevant Orders   Basic metabolic panel     Other   Tremor    Followed by nurology      Knee pain, left    Discussed knee pain issue will refer to orthopedics to further evaluate      Relevant Orders   Ambulatory referral to Orthopedic Surgery    Other Visit Diagnoses     Immunization due       Relevant Orders   Flu vaccine HIGH DOSE PF (Fluzone High dose) (Completed)       Follow up plan: Return in about 6 months (around 07/10/2018) for Physical Exam.

## 2018-01-09 LAB — BASIC METABOLIC PANEL
BUN/Creatinine Ratio: 20 (ref 10–24)
BUN: 26 mg/dL (ref 8–27)
CALCIUM: 10.1 mg/dL (ref 8.6–10.2)
CHLORIDE: 103 mmol/L (ref 96–106)
CO2: 25 mmol/L (ref 20–29)
Creatinine, Ser: 1.32 mg/dL — ABNORMAL HIGH (ref 0.76–1.27)
GFR, EST AFRICAN AMERICAN: 63 mL/min/{1.73_m2} (ref >59)
GFR, EST NON AFRICAN AMERICAN: 54 mL/min/{1.73_m2} — AB (ref >59)
Glucose: 96 mg/dL (ref 65–99)
POTASSIUM: 4.5 mmol/L (ref 3.5–5.2)
SODIUM: 143 mmol/L (ref 134–144)

## 2018-01-21 DIAGNOSIS — H6980 Other specified disorders of Eustachian tube, unspecified ear: Secondary | ICD-10-CM | POA: Diagnosis not present

## 2018-01-21 DIAGNOSIS — J0100 Acute maxillary sinusitis, unspecified: Secondary | ICD-10-CM | POA: Diagnosis not present

## 2018-02-11 DIAGNOSIS — F3113 Bipolar disorder, current episode manic without psychotic features, severe: Secondary | ICD-10-CM | POA: Diagnosis not present

## 2018-05-08 DIAGNOSIS — M1712 Unilateral primary osteoarthritis, left knee: Secondary | ICD-10-CM | POA: Diagnosis not present

## 2018-05-08 DIAGNOSIS — G251 Drug-induced tremor: Secondary | ICD-10-CM | POA: Diagnosis not present

## 2018-05-27 DIAGNOSIS — F3113 Bipolar disorder, current episode manic without psychotic features, severe: Secondary | ICD-10-CM | POA: Diagnosis not present

## 2018-06-10 DIAGNOSIS — F3113 Bipolar disorder, current episode manic without psychotic features, severe: Secondary | ICD-10-CM | POA: Diagnosis not present

## 2018-06-13 ENCOUNTER — Other Ambulatory Visit: Payer: Self-pay

## 2018-06-13 ENCOUNTER — Inpatient Hospital Stay
Admission: EM | Admit: 2018-06-13 | Discharge: 2018-06-18 | DRG: 871 | Disposition: A | Discharge status: Home Health | Payer: Medicare Other | Attending: Internal Medicine | Admitting: Internal Medicine

## 2018-06-13 ENCOUNTER — Emergency Department: Payer: Medicare Other

## 2018-06-13 ENCOUNTER — Encounter: Payer: Self-pay | Admitting: Emergency Medicine

## 2018-06-13 DIAGNOSIS — R0602 Shortness of breath: Secondary | ICD-10-CM

## 2018-06-13 DIAGNOSIS — Z79899 Other long term (current) drug therapy: Secondary | ICD-10-CM

## 2018-06-13 DIAGNOSIS — E440 Moderate protein-calorie malnutrition: Secondary | ICD-10-CM | POA: Diagnosis present

## 2018-06-13 DIAGNOSIS — Z880 Allergy status to penicillin: Secondary | ICD-10-CM

## 2018-06-13 DIAGNOSIS — K4090 Unilateral inguinal hernia, without obstruction or gangrene, not specified as recurrent: Secondary | ICD-10-CM | POA: Diagnosis present

## 2018-06-13 DIAGNOSIS — J9601 Acute respiratory failure with hypoxia: Secondary | ICD-10-CM | POA: Diagnosis not present

## 2018-06-13 DIAGNOSIS — I129 Hypertensive chronic kidney disease with stage 1 through stage 4 chronic kidney disease, or unspecified chronic kidney disease: Secondary | ICD-10-CM | POA: Diagnosis present

## 2018-06-13 DIAGNOSIS — J189 Pneumonia, unspecified organism: Secondary | ICD-10-CM | POA: Diagnosis not present

## 2018-06-13 DIAGNOSIS — N400 Enlarged prostate without lower urinary tract symptoms: Secondary | ICD-10-CM | POA: Diagnosis present

## 2018-06-13 DIAGNOSIS — K219 Gastro-esophageal reflux disease without esophagitis: Secondary | ICD-10-CM | POA: Diagnosis present

## 2018-06-13 DIAGNOSIS — G252 Other specified forms of tremor: Secondary | ICD-10-CM | POA: Diagnosis present

## 2018-06-13 DIAGNOSIS — Z7982 Long term (current) use of aspirin: Secondary | ICD-10-CM

## 2018-06-13 DIAGNOSIS — N182 Chronic kidney disease, stage 2 (mild): Secondary | ICD-10-CM | POA: Diagnosis present

## 2018-06-13 DIAGNOSIS — T380X5A Adverse effect of glucocorticoids and synthetic analogues, initial encounter: Secondary | ICD-10-CM | POA: Diagnosis not present

## 2018-06-13 DIAGNOSIS — E860 Dehydration: Secondary | ICD-10-CM | POA: Diagnosis present

## 2018-06-13 DIAGNOSIS — Z818 Family history of other mental and behavioral disorders: Secondary | ICD-10-CM | POA: Diagnosis not present

## 2018-06-13 DIAGNOSIS — R0682 Tachypnea, not elsewhere classified: Secondary | ICD-10-CM | POA: Diagnosis not present

## 2018-06-13 DIAGNOSIS — Z716 Tobacco abuse counseling: Secondary | ICD-10-CM | POA: Diagnosis not present

## 2018-06-13 DIAGNOSIS — F319 Bipolar disorder, unspecified: Secondary | ICD-10-CM | POA: Diagnosis present

## 2018-06-13 DIAGNOSIS — R05 Cough: Secondary | ICD-10-CM

## 2018-06-13 DIAGNOSIS — Z8249 Family history of ischemic heart disease and other diseases of the circulatory system: Secondary | ICD-10-CM | POA: Diagnosis not present

## 2018-06-13 DIAGNOSIS — Z9181 History of falling: Secondary | ICD-10-CM

## 2018-06-13 DIAGNOSIS — M1712 Unilateral primary osteoarthritis, left knee: Secondary | ICD-10-CM | POA: Diagnosis present

## 2018-06-13 DIAGNOSIS — R531 Weakness: Secondary | ICD-10-CM | POA: Diagnosis not present

## 2018-06-13 DIAGNOSIS — X58XXXA Exposure to other specified factors, initial encounter: Secondary | ICD-10-CM | POA: Diagnosis present

## 2018-06-13 DIAGNOSIS — R197 Diarrhea, unspecified: Secondary | ICD-10-CM | POA: Diagnosis not present

## 2018-06-13 DIAGNOSIS — Z6822 Body mass index (BMI) 22.0-22.9, adult: Secondary | ICD-10-CM

## 2018-06-13 DIAGNOSIS — R6520 Severe sepsis without septic shock: Secondary | ICD-10-CM

## 2018-06-13 DIAGNOSIS — J440 Chronic obstructive pulmonary disease with acute lower respiratory infection: Secondary | ICD-10-CM | POA: Diagnosis present

## 2018-06-13 DIAGNOSIS — N179 Acute kidney failure, unspecified: Secondary | ICD-10-CM | POA: Diagnosis not present

## 2018-06-13 DIAGNOSIS — R059 Cough, unspecified: Secondary | ICD-10-CM

## 2018-06-13 DIAGNOSIS — F17210 Nicotine dependence, cigarettes, uncomplicated: Secondary | ICD-10-CM | POA: Diagnosis present

## 2018-06-13 DIAGNOSIS — R52 Pain, unspecified: Secondary | ICD-10-CM

## 2018-06-13 DIAGNOSIS — J441 Chronic obstructive pulmonary disease with (acute) exacerbation: Secondary | ICD-10-CM | POA: Diagnosis present

## 2018-06-13 DIAGNOSIS — G479 Sleep disorder, unspecified: Secondary | ICD-10-CM | POA: Diagnosis not present

## 2018-06-13 DIAGNOSIS — A419 Sepsis, unspecified organism: Secondary | ICD-10-CM | POA: Diagnosis not present

## 2018-06-13 DIAGNOSIS — Z720 Tobacco use: Secondary | ICD-10-CM | POA: Diagnosis not present

## 2018-06-13 DIAGNOSIS — J181 Lobar pneumonia, unspecified organism: Secondary | ICD-10-CM | POA: Diagnosis not present

## 2018-06-13 DIAGNOSIS — R000 Tachycardia, unspecified: Secondary | ICD-10-CM | POA: Diagnosis not present

## 2018-06-13 DIAGNOSIS — R42 Dizziness and giddiness: Secondary | ICD-10-CM | POA: Diagnosis not present

## 2018-06-13 HISTORY — DX: Other specified forms of tremor: G25.2

## 2018-06-13 LAB — URINALYSIS, COMPLETE (UACMP) WITH MICROSCOPIC
Glucose, UA: NEGATIVE mg/dL
Hgb urine dipstick: NEGATIVE
Ketones, ur: NEGATIVE mg/dL
Leukocytes,Ua: NEGATIVE
Nitrite: NEGATIVE
Protein, ur: 100 mg/dL — AB
Specific Gravity, Urine: 1.026 (ref 1.005–1.030)
pH: 5 (ref 5.0–8.0)

## 2018-06-13 LAB — CBC
HCT: 36.5 % — ABNORMAL LOW (ref 39.0–52.0)
HCT: 41.1 % (ref 39.0–52.0)
Hemoglobin: 12.1 g/dL — ABNORMAL LOW (ref 13.0–17.0)
Hemoglobin: 14.1 g/dL (ref 13.0–17.0)
MCH: 30.9 pg (ref 26.0–34.0)
MCH: 30.9 pg (ref 26.0–34.0)
MCHC: 33.2 g/dL (ref 30.0–36.0)
MCHC: 34.3 g/dL (ref 30.0–36.0)
MCV: 90.1 fL (ref 80.0–100.0)
MCV: 93.1 fL (ref 80.0–100.0)
PLATELETS: 233 10*3/uL (ref 150–400)
Platelets: 258 10*3/uL (ref 150–400)
RBC: 3.92 MIL/uL — ABNORMAL LOW (ref 4.22–5.81)
RBC: 4.56 MIL/uL (ref 4.22–5.81)
RDW: 13.3 % (ref 11.5–15.5)
RDW: 13.7 % (ref 11.5–15.5)
WBC: 15.7 10*3/uL — ABNORMAL HIGH (ref 4.0–10.5)
WBC: 19.6 10*3/uL — ABNORMAL HIGH (ref 4.0–10.5)
nRBC: 0 % (ref 0.0–0.2)
nRBC: 0 % (ref 0.0–0.2)

## 2018-06-13 LAB — PROTIME-INR
INR: 1.4 — ABNORMAL HIGH (ref 0.8–1.2)
Prothrombin Time: 16.6 seconds — ABNORMAL HIGH (ref 11.4–15.2)

## 2018-06-13 LAB — BASIC METABOLIC PANEL
ANION GAP: 13 (ref 5–15)
BUN: 25 mg/dL — ABNORMAL HIGH (ref 8–23)
CO2: 18 mmol/L — ABNORMAL LOW (ref 22–32)
Calcium: 10 mg/dL (ref 8.9–10.3)
Chloride: 103 mmol/L (ref 98–111)
Creatinine, Ser: 1.42 mg/dL — ABNORMAL HIGH (ref 0.61–1.24)
GFR calc Af Amer: 57 mL/min — ABNORMAL LOW (ref >60)
GFR calc non Af Amer: 49 mL/min — ABNORMAL LOW (ref >60)
Glucose, Bld: 171 mg/dL — ABNORMAL HIGH (ref 70–99)
Potassium: 4.4 mmol/L (ref 3.5–5.1)
Sodium: 134 mmol/L — ABNORMAL LOW (ref 135–145)

## 2018-06-13 LAB — INFLUENZA PANEL BY PCR (TYPE A & B)
Influenza A By PCR: NEGATIVE
Influenza B By PCR: NEGATIVE

## 2018-06-13 LAB — COMPREHENSIVE METABOLIC PANEL
ALBUMIN: 2.5 g/dL — AB (ref 3.5–5.0)
ALT: 18 U/L (ref 0–44)
AST: 26 U/L (ref 15–41)
Alkaline Phosphatase: 49 U/L (ref 38–126)
Anion gap: 9 (ref 5–15)
BUN: 25 mg/dL — AB (ref 8–23)
CO2: 21 mmol/L — ABNORMAL LOW (ref 22–32)
Calcium: 8.8 mg/dL — ABNORMAL LOW (ref 8.9–10.3)
Chloride: 106 mmol/L (ref 98–111)
Creatinine, Ser: 1.42 mg/dL — ABNORMAL HIGH (ref 0.61–1.24)
GFR calc Af Amer: 57 mL/min — ABNORMAL LOW (ref >60)
GFR calc non Af Amer: 49 mL/min — ABNORMAL LOW (ref >60)
GLUCOSE: 150 mg/dL — AB (ref 70–99)
Potassium: 4.3 mmol/L (ref 3.5–5.1)
SODIUM: 136 mmol/L (ref 135–145)
Total Bilirubin: 0.7 mg/dL (ref 0.3–1.2)
Total Protein: 6.1 g/dL — ABNORMAL LOW (ref 6.5–8.1)

## 2018-06-13 LAB — BRAIN NATRIURETIC PEPTIDE: B Natriuretic Peptide: 204 pg/mL — ABNORMAL HIGH (ref 0.0–100.0)

## 2018-06-13 LAB — APTT: aPTT: 35 seconds (ref 24–36)

## 2018-06-13 LAB — LACTIC ACID, PLASMA
Lactic Acid, Venous: 2.7 mmol/L (ref 0.5–1.9)
Lactic Acid, Venous: 2.9 mmol/L (ref 0.5–1.9)

## 2018-06-13 LAB — PROCALCITONIN: Procalcitonin: 28.6 ng/mL

## 2018-06-13 LAB — TROPONIN I: Troponin I: 0.03 ng/mL (ref <0.03)

## 2018-06-13 LAB — MAGNESIUM: MAGNESIUM: 1.7 mg/dL (ref 1.7–2.4)

## 2018-06-13 MED ORDER — IPRATROPIUM-ALBUTEROL 0.5-2.5 (3) MG/3ML IN SOLN
3.0000 mL | Freq: Once | RESPIRATORY_TRACT | Status: AC
  Administered 2018-06-13: 3 mL via RESPIRATORY_TRACT
  Filled 2018-06-13: qty 3

## 2018-06-13 MED ORDER — SODIUM CHLORIDE 0.9 % IV BOLUS
1000.0000 mL | Freq: Once | INTRAVENOUS | Status: AC
  Administered 2018-06-13: 1000 mL via INTRAVENOUS

## 2018-06-13 MED ORDER — HYDROCODONE-ACETAMINOPHEN 5-325 MG PO TABS: 1.0000 | ORAL_TABLET | ORAL | Status: DC | PRN

## 2018-06-13 MED ORDER — ONDANSETRON HCL 4 MG/2ML IJ SOLN: 4.0000 mg | Freq: Four times a day (QID) | INTRAMUSCULAR | Status: DC | PRN

## 2018-06-13 MED ORDER — ASPIRIN EC 81 MG PO TBEC
81.0000 mg | DELAYED_RELEASE_TABLET | Freq: Every day | ORAL | Status: DC
  Administered 2018-06-14 – 2018-06-18 (×5): 81 mg via ORAL
  Filled 2018-06-13 (×5): qty 1

## 2018-06-13 MED ORDER — ARIPIPRAZOLE 5 MG PO TABS
5.0000 mg | ORAL_TABLET | Freq: Every day | ORAL | Status: DC
  Administered 2018-06-14 – 2018-06-18 (×5): 5 mg via ORAL
  Filled 2018-06-13 (×5): qty 1

## 2018-06-13 MED ORDER — AZITHROMYCIN 500 MG PO TABS
500.0000 mg | ORAL_TABLET | Freq: Once | ORAL | Status: AC
  Administered 2018-06-13: 500 mg via ORAL
  Filled 2018-06-13: qty 1

## 2018-06-13 MED ORDER — METHYLPREDNISOLONE SODIUM SUCC 125 MG IJ SOLR
125.0000 mg | Freq: Once | INTRAMUSCULAR | Status: AC
  Administered 2018-06-13: 125 mg via INTRAVENOUS
  Filled 2018-06-13: qty 2

## 2018-06-13 MED ORDER — TAMSULOSIN HCL 0.4 MG PO CAPS
0.4000 mg | ORAL_CAPSULE | Freq: Every day | ORAL | Status: DC
  Administered 2018-06-13 – 2018-06-17 (×5): 0.4 mg via ORAL
  Filled 2018-06-13 (×5): qty 1

## 2018-06-13 MED ORDER — BISACODYL 5 MG PO TBEC: 5.0000 mg | DELAYED_RELEASE_TABLET | Freq: Every day | ORAL | Status: DC | PRN

## 2018-06-13 MED ORDER — ALBUTEROL SULFATE (2.5 MG/3ML) 0.083% IN NEBU
2.5000 mg | INHALATION_SOLUTION | RESPIRATORY_TRACT | Status: DC | PRN
  Administered 2018-06-15: 2.5 mg via RESPIRATORY_TRACT
  Filled 2018-06-13: qty 3

## 2018-06-13 MED ORDER — SODIUM CHLORIDE 0.9 % IV SOLN
1.0000 g | INTRAVENOUS | Status: DC
  Filled 2018-06-13: qty 10

## 2018-06-13 MED ORDER — GABAPENTIN 300 MG PO CAPS
300.0000 mg | ORAL_CAPSULE | Freq: Three times a day (TID) | ORAL | Status: DC
  Administered 2018-06-13 – 2018-06-18 (×15): 300 mg via ORAL
  Filled 2018-06-13 (×15): qty 1

## 2018-06-13 MED ORDER — ACETAMINOPHEN 325 MG PO TABS
650.0000 mg | ORAL_TABLET | Freq: Four times a day (QID) | ORAL | Status: DC | PRN
  Administered 2018-06-13: 650 mg via ORAL
  Filled 2018-06-13: qty 2

## 2018-06-13 MED ORDER — SODIUM CHLORIDE 0.9 % IV SOLN
INTRAVENOUS | Status: DC
  Administered 2018-06-13 – 2018-06-15 (×4): via INTRAVENOUS

## 2018-06-13 MED ORDER — SODIUM CHLORIDE 0.9 % IV SOLN
500.0000 mg | INTRAVENOUS | Status: AC
  Administered 2018-06-14 – 2018-06-15 (×2): 500 mg via INTRAVENOUS
  Filled 2018-06-13 (×2): qty 500

## 2018-06-13 MED ORDER — ALBUTEROL SULFATE (2.5 MG/3ML) 0.083% IN NEBU: 2.5000 mg | INHALATION_SOLUTION | Freq: Four times a day (QID) | RESPIRATORY_TRACT | Status: DC

## 2018-06-13 MED ORDER — FLUTICASONE PROPIONATE 50 MCG/ACT NA SUSP
2.0000 | Freq: Every day | NASAL | Status: DC
  Administered 2018-06-14 – 2018-06-18 (×5): 2 via NASAL
  Filled 2018-06-13: qty 16

## 2018-06-13 MED ORDER — ALBUTEROL SULFATE (2.5 MG/3ML) 0.083% IN NEBU
2.5000 mg | INHALATION_SOLUTION | Freq: Four times a day (QID) | RESPIRATORY_TRACT | Status: DC
  Administered 2018-06-13 – 2018-06-15 (×7): 2.5 mg via RESPIRATORY_TRACT
  Filled 2018-06-13 (×7): qty 3

## 2018-06-13 MED ORDER — SODIUM CHLORIDE 0.9 % IV SOLN
1.0000 g | Freq: Once | INTRAVENOUS | Status: AC
  Administered 2018-06-13: 1 g via INTRAVENOUS
  Filled 2018-06-13: qty 10

## 2018-06-13 MED ORDER — GUAIFENESIN-DM 100-10 MG/5ML PO SYRP
5.0000 mL | ORAL_SOLUTION | ORAL | Status: DC | PRN
  Administered 2018-06-15: 5 mL via ORAL
  Filled 2018-06-13: qty 5

## 2018-06-13 MED ORDER — CARBAMAZEPINE 200 MG PO TABS
200.0000 mg | ORAL_TABLET | Freq: Three times a day (TID) | ORAL | Status: DC
  Administered 2018-06-13 – 2018-06-18 (×15): 200 mg via ORAL
  Filled 2018-06-13 (×16): qty 1

## 2018-06-13 MED ORDER — SENNOSIDES-DOCUSATE SODIUM 8.6-50 MG PO TABS: 1.0000 | ORAL_TABLET | Freq: Every evening | ORAL | Status: DC | PRN

## 2018-06-13 MED ORDER — PANTOPRAZOLE SODIUM 40 MG PO TBEC
40.0000 mg | DELAYED_RELEASE_TABLET | Freq: Every day | ORAL | Status: DC
  Administered 2018-06-14 – 2018-06-18 (×5): 40 mg via ORAL
  Filled 2018-06-13 (×5): qty 1

## 2018-06-13 MED ORDER — ENOXAPARIN SODIUM 40 MG/0.4ML ~~LOC~~ SOLN
40.0000 mg | SUBCUTANEOUS | Status: DC
  Administered 2018-06-13 – 2018-06-17 (×5): 40 mg via SUBCUTANEOUS
  Filled 2018-06-13 (×5): qty 0.4

## 2018-06-13 MED ORDER — ACETAMINOPHEN 650 MG RE SUPP: 650.0000 mg | Freq: Four times a day (QID) | RECTAL | Status: DC | PRN

## 2018-06-13 MED ORDER — ONDANSETRON HCL 4 MG PO TABS: 4.0000 mg | ORAL_TABLET | Freq: Four times a day (QID) | ORAL | Status: DC | PRN

## 2018-06-13 NOTE — ED Provider Notes (Signed)
[facility] Emergency Department Provider Note  ____________________________________________  Time seen: Approximately 4:12 PM  I have reviewed the triage vital signs and the nursing notes.   HISTORY  Chief Complaint Cough; Shortness of Breath; and Dizziness   HPI Tony Fowler is a 73 y.o. male history of hypertension, bipolar disorder, BPH, smoking, GERD who presents for evaluation of cough and shortness of breath.  Patient has had a cough, subjective fevers and progressively worsening shortness of breath for 6 days.  Has been exposed to several grandchildren with URI symptoms.  His cough is productive but he is unable to bring any phlegm up.  His shortness of breath is now constant and severe.  He is a smoker but has no known diagnosis of COPD.  Has had nausea and significantly decreased appetite, occasional vomiting but no diarrhea.  Daughter is concerned because patient has been unable to keep he is psychiatric medications down and she is concerned that he will have psychiatric decompensation.   Past Medical History:  Diagnosis Date  . Bipolar 1 disorder (HCC)   . Coarse tremors   . Hypertension     Patient Active Problem List   Diagnosis Date Noted  . Sepsis (HCC) 06/13/2018  . Knee pain, left 01/08/2018  . Advanced care planning/counseling discussion 06/26/2017  . Hyperchloremia 02/04/2017  . Tremor 05/16/2016  . Reflux esophagitis 05/16/2016  . Inguinal hernia 05/16/2016  . Bipolar affective disorder, manic, severe, with psychotic behavior (HCC) 12/01/2015  . Tobacco use disorder 12/01/2015  . BPH with obstruction/lower urinary tract symptoms 02/22/2015  . Benign hypertension 09/07/2014    Past Surgical History:  Procedure Laterality Date  . TONSILECTOMY/ADENOIDECTOMY WITH MYRINGOTOMY      Prior to Admission medications   Medication Sig Start Date End Date Taking? Authorizing Provider  ARIPiprazole (ABILIFY) 5 MG tablet Take by mouth.     Yes [provider]  aspirin EC 81 MG tablet Take 81 mg by mouth daily.   Yes [provider]  carbamazepine (TEGRETOL) 200 MG tablet Take 200 mg by mouth 3 (three) times daily.   Yes [provider]  fluticasone (FLONASE) 50 MCG/ACT nasal spray Place 2 sprays into both nostrils daily.   Yes [provider]  gabapentin (NEURONTIN) 300 MG capsule Take 600 mg by mouth 3 (three) times daily.    Yes [provider]  lisinopril (PRINIVIL,ZESTRIL) 10 MG tablet Take 1 tablet (10 mg total) by mouth daily. 01/08/18  Yes Crissman, Redge Gainer, MD  Multiple Vitamin (MULTIVITAMIN) tablet Take 1 tablet by mouth daily.   Yes [provider]  omeprazole (PRILOSEC) 40 MG capsule Take 1 capsule (40 mg total) by mouth daily. 06/26/17  Yes Crissman, Redge Gainer, MD  tamsulosin (FLOMAX) 0.4 MG CAPS capsule Take 1 capsule (0.4 mg total) by mouth daily. 06/26/17  Yes Crissman, Redge Gainer, MD  divalproex (DEPAKOTE) 500 MG DR tablet Take 1 tablet (500 mg total) by mouth 2 (two) times daily. Patient not taking: Reported on 06/13/2018 12/13/15   Shari Prows, MD    Allergies Penicillins  Family History  Problem Relation Age of Onset  . Heart disease Mother   . Hypertension Mother   . Mental illness Mother   . Heart disease Brother   . Hypertension Brother     Social History Social History   Tobacco Use  . Smoking status: Current Every Day Smoker    Packs/day: 0.50    Years: 0.00    Pack years:  0.00    Types: Cigarettes  . Smokeless tobacco: Never Used  Substance Use Topics  . Alcohol use: No    Alcohol/week: 0.0 standard drinks    Comment: 1 beer occasionally   . Drug use: No    Review of Systems  Constitutional: + fever. Eyes: Negative for visual changes. ENT: Negative for sore throat. Neck: No neck pain  Cardiovascular: Negative for chest pain. Respiratory: + shortness of breath, cough Gastrointestinal: Negative for abdominal pain,diarrhea. +  N/V Genitourinary: Negative for dysuria. Musculoskeletal: Negative for back pain. Skin: Negative for rash. Neurological: Negative for headaches, weakness or numbness. Psych: No SI or HI  ____________________________________________   PHYSICAL EXAM:  VITAL SIGNS: ED Triage Vitals  Enc Vitals Group     BP 06/13/18 1518 (!) 149/114     Pulse Rate 06/13/18 1518 (!) 133     Resp 06/13/18 1518 (!) 36     Temp 06/13/18 1518 98.9 F (37.2 C)     Temp Source 06/13/18 1518 Oral     SpO2 06/13/18 1518 (!) 84 %     Weight 06/13/18 1520 137 lb (62.1 kg)     Height 06/13/18 1520 5\' 6"  (1.676 m)     Head Circumference --      Peak Flow --      Pain Score 06/13/18 1520 0     Pain Loc --      Pain Edu? --      Excl. in GC? --     Constitutional: Alert and oriented, in respiratory distress.  HEENT:      Head: Normocephalic and atraumatic.         Eyes: Conjunctivae are normal. Sclera is non-icteric.       Mouth/Throat: Mucous membranes are dry.       Neck: Supple with no signs of meningismus. Cardiovascular: Tachycardic with regular rhythm. Respiratory: Tachypneic, hypoxic to 84% which improves on 4 L nasal cannula, severely diminished air movement bilaterally with no crackles or wheezes  Gastrointestinal: Soft, non tender, and non distended with positive bowel sounds. No rebound or guarding. Musculoskeletal: Nontender with normal range of motion in all extremities. No edema, cyanosis, or erythema of extremities. Neurologic: Normal speech and language. Face is symmetric. Moving all extremities. No gross focal neurologic deficits are appreciated. Skin: Skin is warm, dry and intact. No rash noted. Psychiatric: Mood and affect are normal. Speech and behavior are normal.  ____________________________________________   LABS (all labs ordered are listed, but only abnormal results are displayed)  Labs Reviewed  BASIC METABOLIC PANEL - Abnormal; Notable for the following components:       Result Value   Sodium 134 (*)    CO2 18 (*)    Glucose, Bld 171 (*)    BUN 25 (*)    Creatinine, Ser 1.42 (*)    GFR calc non Af Amer 49 (*)    GFR calc Af Amer 57 (*)    All other components within normal limits  CBC - Abnormal; Notable for the following components:   WBC 19.6 (*)    All other components within normal limits  URINALYSIS, COMPLETE (UACMP) WITH MICROSCOPIC - Abnormal; Notable for the following components:   Color, Urine AMBER (*)    APPearance HAZY (*)    Bilirubin Urine SMALL (*)    Protein, ur 100 (*)    Bacteria, UA RARE (*)    All other components within normal limits  BRAIN NATRIURETIC PEPTIDE - Abnormal; Notable for the following  components:   B Natriuretic Peptide 204.0 (*)    All other components within normal limits  LACTIC ACID, PLASMA - Abnormal; Notable for the following components:   Lactic Acid, Venous 2.7 (*)    All other components within normal limits  CULTURE, BLOOD (ROUTINE X 2)  CULTURE, BLOOD (ROUTINE X 2)  TROPONIN I  INFLUENZA PANEL BY PCR (TYPE A & B)  COMPREHENSIVE METABOLIC PANEL  URINALYSIS, ROUTINE W REFLEX MICROSCOPIC  LACTIC ACID, PLASMA   ____________________________________________  EKG  ED ECG REPORT I, Nita Sickle, the attending physician, personally viewed and interpreted this ECG.  Sinus tachycardia, rate of 128, normal intervals, right axis deviation, mild ST depressions on lateral leads with no ST elevation.  New when compared to prior. ____________________________________________  RADIOLOGY  I have personally reviewed the images performed during this visit and I agree with the Radiologist's read.   Interpretation by Radiologist:  Dg Chest Port 1 View  Result Date: 06/13/2018 CLINICAL DATA:  Cough, weakness EXAM: PORTABLE CHEST 1 VIEW COMPARISON:  04/20/2016 FINDINGS: Consolidation noted in both lower lobes, right greater than left concerning for pneumonia. Heart is normal size. No effusions. No acute bony  abnormality. IMPRESSION: Bilateral lower lobe airspace consolidations, right greater than left compatible with pneumonia. Electronically Signed   By: Charlett Nose M.D.   On: 06/13/2018 16:46      ____________________________________________   PROCEDURES  Procedure(s) performed: None Procedures Critical Care performed:  Yes  CRITICAL CARE Performed by: Nita Sickle  ?  Total critical care time: 35 min  Critical care time was exclusive of separately billable procedures and treating other patients.  Critical care was necessary to treat or prevent imminent or life-threatening deterioration.  Critical care was time spent personally by me on the following activities: development of treatment plan with patient and/or surrogate as well as nursing, discussions with consultants, evaluation of patient's response to treatment, examination of patient, obtaining history from patient or surrogate, ordering and performing treatments and interventions, ordering and review of laboratory studies, ordering and review of radiographic studies, pulse oximetry and re-evaluation of patient's condition.  ____________________________________________   INITIAL IMPRESSION / ASSESSMENT AND PLAN / ED COURSE  73 y.o. male history of hypertension, bipolar disorder, BPH, smoking, GERD who presents for evaluation of cough and shortness of breath, fever.  Patient arrives in moderate respiratory distress, hypoxic, tachycardic, severely diminished air movement bilaterally.  No history of COPD but patient is a chronic smoker therefore will be started on duo nebs and Solu-Medrol.  Chest x-ray is concerning for bilateral pneumonia.  No recent hospitalization therefore will treat as community-acquired pneumonia with Rocephin and doxycycline.  Will give IV fluids for tachycardia.  Patient is afebrile at this time however has received Tylenol at home prior to arrival to the emergency room.  Labs showing white count of 19.6  meeting sepsis criteria with tachycardia, tachypnea and leukocytosis. Anticipate admission.       As part of my medical decision making, I reviewed the following data within the electronic MEDICAL RECORD NUMBER Nursing notes reviewed and incorporated, Labs reviewed , EKG interpreted , Old EKG reviewed, Old chart reviewed, Radiograph reviewed , Discussed with admitting physician , Notes from prior ED visits and [place] Controlled Substance Database    Pertinent labs & imaging results that were available during my care of the patient were reviewed by me and considered in my medical decision making (see chart for details).    ____________________________________________   FINAL CLINICAL IMPRESSION(S) / ED  DIAGNOSES  Final diagnoses:  Community acquired pneumonia, unspecified laterality  Acute respiratory failure with hypoxia (HCC)  Sepsis with acute hypoxic respiratory failure without septic shock, due to unspecified organism [facility])      NEW MEDICATIONS STARTED DURING THIS VISIT:  ED Discharge Orders    None       Note:  This document was prepared using Dragon voice recognition software and may include unintentional dictation errors.    Don Perking, [place], MD 06/13/18 [phone]

## 2018-06-13 NOTE — Progress Notes (Signed)
   06/13/18 2000  Clinical Encounter Type  Visited With Patient and family together  Visit Type Initial;Spiritual support  Referral From Physician

## 2018-06-13 NOTE — H&P (Signed)
[facility] [place] [facility]   PATIENT NAME: Tony Fowler    MR#:  [id number]  DATE OF BIRTH:  11-12-1945  DATE OF ADMISSION:  06/13/2018  PRIMARY CARE PHYSICIAN: Steele Sizer, MD   REQUESTING/REFERRING PHYSICIAN: Nita Sickle, MD  CHIEF COMPLAINT:   Chief Complaint  Patient presents with  . Cough  . Shortness of Breath  . Dizziness   Cough, shortness of breath and dizziness for 6 days. HISTORY OF PRESENT ILLNESS:  Tony Fowler  is a 73 y.o. male with a known history of bipolar disorder, coarse tremors and hypertension.  The patient is sent from home to ED due to above chief complaints.  The patient also complains of fever chills, nausea, decreased appetite, cough with phlegm and worsening shortness of breath.  The patient's grandchildren had some cold recently.  The patient is found hypoxia at 85% and put on oxygen by nasal cannula 4 L.  He also has tachycardia, tachypnea, leukocytosis, chest x-ray show bilateral basilar pneumonia.  He is treated with antibiotics, IV fluid in the ED.  ED physician request admission.  PAST MEDICAL HISTORY:   Past Medical History:  Diagnosis Date  . Bipolar 1 disorder (HCC)   . Coarse tremors   . Hypertension     PAST SURGICAL HISTORY:   Past Surgical History:  Procedure Laterality Date  . TONSILECTOMY/ADENOIDECTOMY WITH MYRINGOTOMY      SOCIAL HISTORY:   Social History   Tobacco Use  . Smoking status: Current Every Day Smoker    Packs/day: 0.50    Years: 0.00    Pack years: 0.00    Types: Cigarettes  . Smokeless tobacco: Never Used  Substance Use Topics  . Alcohol use: No    Alcohol/week: 0.0 standard drinks    Comment: 1 beer occasionally     FAMILY HISTORY:   Family History  Problem Relation Age of Onset  . Heart disease Mother   . Hypertension Mother   . Mental illness Mother   . Heart disease Brother   . Hypertension Brother     DRUG ALLERGIES:   Allergies  Allergen  Reactions  . Penicillins Rash    Childhood allergy, more than 30 years ago. No hospital required.    REVIEW OF SYSTEMS:   Review of Systems  Constitutional: Positive for chills, fever and malaise/fatigue.  HENT: Negative for sore throat.   Eyes: Negative for blurred vision and double vision.  Respiratory: Positive for cough, sputum production and shortness of breath. Negative for hemoptysis, wheezing and stridor.   Cardiovascular: Negative for chest pain, palpitations, orthopnea and leg swelling.  Gastrointestinal: Positive for nausea. Negative for abdominal pain, blood in stool, diarrhea, melena and vomiting.  Genitourinary: Negative for dysuria, flank pain and hematuria.  Musculoskeletal: Negative for back pain and joint pain.  Skin: Negative for rash.  Neurological: Positive for dizziness. Negative for sensory change, focal weakness, seizures, loss of consciousness, weakness and headaches.  Endo/Heme/Allergies: Negative for polydipsia.  Psychiatric/Behavioral: Negative for depression. The patient is not nervous/anxious.     MEDICATIONS AT HOME:   Prior to Admission medications   Medication Sig Start Date End Date Taking? Authorizing Provider  ARIPiprazole (ABILIFY) 5 MG tablet Take by mouth.    Yes [provider]  aspirin EC 81 MG tablet Take 81 mg by mouth daily.   Yes [provider]  carbamazepine (TEGRETOL) 200 MG tablet Take 200 mg by mouth 3 (three) times daily.   Yes [provider]  fluticasone (FLONASE) 50 MCG/ACT nasal spray Place 2 sprays into both nostrils daily.   Yes [provider]  gabapentin (NEURONTIN) 300 MG capsule Take 600 mg by mouth 3 (three) times daily.    Yes [provider]  lisinopril (PRINIVIL,ZESTRIL) 10 MG tablet Take 1 tablet (10 mg total) by mouth daily. 01/08/18  Yes Crissman, Redge Gainer, MD  Multiple Vitamin (MULTIVITAMIN) tablet Take 1 tablet by mouth daily.   Yes [provider]  omeprazole  (PRILOSEC) 40 MG capsule Take 1 capsule (40 mg total) by mouth daily. 06/26/17  Yes Crissman, Redge Gainer, MD  tamsulosin (FLOMAX) 0.4 MG CAPS capsule Take 1 capsule (0.4 mg total) by mouth daily. 06/26/17  Yes Crissman, Redge Gainer, MD  divalproex (DEPAKOTE) 500 MG DR tablet Take 1 tablet (500 mg total) by mouth 2 (two) times daily. Patient not taking: Reported on 06/13/2018 12/13/15   Pucilowska, Ellin Goodie, MD      VITAL SIGNS:  Blood pressure (!) 112/100, pulse (!) 110, temperature 98.9 F (37.2 C), temperature source Oral, resp. rate (!) 36, height 5\' 6"  (1.676 m), weight 62.1 kg, SpO2 91 %.  PHYSICAL EXAMINATION:  Physical Exam  GENERAL:  73 y.o.-year-old patient lying in the bed with no acute distress.  EYES: Pupils equal, round, reactive to light and accommodation. No scleral icterus. Extraocular muscles intact.  HEENT: Head atraumatic, normocephalic. Oropharynx and nasopharynx clear.  NECK:  Supple, no jugular venous distention. No thyroid enlargement, no tenderness.  LUNGS: Normal breath sounds bilaterally, no wheezing, bilateral basilar crackles, no use of accessory muscles of respiration.  CARDIOVASCULAR: S1, S2 normal. No murmurs, rubs, or gallops.  ABDOMEN: Soft, nontender, nondistended. Bowel sounds present. No organomegaly or mass.  EXTREMITIES: No pedal edema, cyanosis, or clubbing.  NEUROLOGIC: Cranial nerves II through XII are intact. Muscle strength 5/5 in all extremities. Sensation intact. Gait not checked.  PSYCHIATRIC: The patient is alert and oriented x 3.  SKIN: No obvious rash, lesion, or ulcer.   LABORATORY PANEL:   CBC Recent Labs  Lab 06/13/18 1544  WBC 19.6*  HGB 14.1  HCT 41.1  PLT 258   ------------------------------------------------------------------------------------------------------------------  Chemistries  Recent Labs  Lab 06/13/18 1544  NA 134*  K 4.4  CL 103  CO2 18*  GLUCOSE 171*  BUN 25*  CREATININE 1.42*  CALCIUM 10.0    ------------------------------------------------------------------------------------------------------------------  Cardiac Enzymes No results for input(s): TROPONINI in the last 168 hours. ------------------------------------------------------------------------------------------------------------------  RADIOLOGY:  Dg Chest Port 1 View  Result Date: 06/13/2018 CLINICAL DATA:  Cough, weakness EXAM: PORTABLE CHEST 1 VIEW COMPARISON:  04/20/2016 FINDINGS: Consolidation noted in both lower lobes, right greater than left concerning for pneumonia. Heart is normal size. No effusions. No acute bony abnormality. IMPRESSION: Bilateral lower lobe airspace consolidations, right greater than left compatible with pneumonia. Electronically Signed   By: Charlett Nose M.D.   On: 06/13/2018 16:46      IMPRESSION AND PLAN:   Acute respiratory failure with hypoxia due to sepsis and pneumonia. The patient will be admitted to medical floor. Continue oxygen by nasal cannula, albuterol every 6 hours, Robitussin as needed. Continue Levaquin, follow-up CBC and blood culture.  Follow sepsis protocol. Follow-up influenza test and respiratory panel.  Acute renal failure due to dehydration.  Hold lisinopril, IV fluid support and follow-up BMP. Hypertension.  Hold lisinopril due to sepsis and acute renal failure. Bipolar disorder.  Continue home medication. Tobacco abuse.  Smoking cessation was counseled for 3 to 4 minutes.  All the records are reviewed and case discussed with ED provider. Management plans discussed with the patient, her daughter and they are in agreement.  CODE STATUS: Full code  TOTAL TIME TAKING CARE OF THIS PATIENT: 37 minutes.    Shaune Pollack M.D on 06/13/2018 at 5:16 PM  Between 7am to 6pm - Pager - [phone]  After 6pm go to [email or web address] - Social research officer, government  [facility]  Office  [phone]  CC: Primary care physician; Steele Sizer,  MD   Note: This dictation was prepared with Dragon dictation along with smaller phrase technology. Any transcriptional errors that result from this process are unin

## 2018-06-13 NOTE — ED Notes (Signed)
ED TO INPATIENT HANDOFF REPORT  ED Nurse Name and Phone #: Dora Sims [phone]  S Name/Age/Gender Tony Fowler 73 y.o. male Room/Bed: ED12A/ED12A  Code Status   Code Status: Prior  Home/SNF/Other Home Patient oriented to: self, place, time and situation Is this baseline? Yes   Triage Complete: Triage complete  Chief Complaint SOB  Triage Note Patient presents to the ED with cough/congestion since Sunday and some intermittent dizziness and weakness since Wednesday.  Patient fell on Wednesday and has some bruising to the right side of his face.  [facility] and has recently had medication changes with changing from depakote to tegretol and reduced patient's gabapentin.     Allergies Allergies  Allergen Reactions  . Penicillins Rash    Childhood allergy, more than 30 years ago. No hospital required.    Level of Care/Admitting Diagnosis ED Disposition    ED Disposition Condition Comment   Admit  Hospital Area: [facility] [[id number]]  Level of Care: Med-Surg [16]  Diagnosis: Sepsis [facility]) [[id number]]  Admitting Physician: Shaune Pollack [[id number]]  Attending Physician: Shaune Pollack [[id number]]  Estimated length of stay: 3 - 4 days  Certification:: I certify this patient will need inpatient services for at least 2 midnights  PT Class (Do Not Modify): Inpatient [101]  PT Acc Code (Do Not Modify): Private [1]       B Medical/Surgery History Past Medical History:  Diagnosis Date  . Bipolar 1 disorder (HCC)   . Coarse tremors   . Hypertension    Past Surgical History:  Procedure Laterality Date  . TONSILECTOMY/ADENOIDECTOMY WITH MYRINGOTOMY       A IV Location/Drains/Wounds Patient Lines/Drains/Airways Status   Active Line/Drains/Airways    Name:   Placement date:   Placement time:   Site:   Days:   Peripheral IV 06/13/18 Left Forearm   06/13/18    1544    Forearm   less than 1   Peripheral IV 06/13/18 Right Wrist   06/13/18    1643     Wrist   less than 1          Intake/Output Last 24 hours  Intake/Output Summary (Last 24 hours) at 06/13/2018 1758 Last data filed at 06/13/2018 1745 Gross per 24 hour  Intake 100 ml  Output -  Net 100 ml    Labs/Imaging Results for orders placed or performed during the hospital encounter of 06/13/18 (from the past 48 hour(s))  Basic metabolic panel     Status: Abnormal   Collection Time: 06/13/18  3:44 PM  Result Value Ref Range   Sodium 134 (L) 135 - 145 mmol/L   Potassium 4.4 3.5 - 5.1 mmol/L   Chloride 103 98 - 111 mmol/L   CO2 18 (L) 22 - 32 mmol/L   Glucose, Bld 171 (H) 70 - 99 mg/dL   BUN 25 (H) 8 - 23 mg/dL   Creatinine, Ser 3.79 (H) 0.61 - 1.24 mg/dL   Calcium 02.4 8.9 - 09.7 mg/dL   GFR calc non Af Amer 49 (L) >60 mL/min   GFR calc Af Amer 57 (L) >60 mL/min   Anion gap 13 5 - 15    Comment: Performed at [facility], [address] [place], [place] [address]  CBC     Status: Abnormal   Collection Time: 06/13/18  3:44 PM  Result Value Ref Range   WBC 19.6 (H) 4.0 - 10.5 K/uL   RBC 4.56 4.22 - 5.81 MIL/uL  Hemoglobin 14.1 13.0 - 17.0 g/dL   HCT 15.8 30.9 - 40.7 %   MCV 90.1 80.0 - 100.0 fL   MCH 30.9 26.0 - 34.0 pg   MCHC 34.3 30.0 - 36.0 g/dL   RDW 68.0 88.1 - 10.3 %   Platelets 258 150 - 400 K/uL   nRBC 0.0 0.0 - 0.2 %    Comment: Performed at [facility], [address] [place], [place] [address]  Brain natriuretic peptide     Status: Abnormal   Collection Time: 06/13/18  3:46 PM  Result Value Ref Range   B Natriuretic Peptide 204.0 (H) 0.0 - 100.0 pg/mL    Comment: Performed at [facility], [address] [place], [place] [address]  Urinalysis, Complete w Microscopic     Status: Abnormal   Collection Time: 06/13/18  4:06 PM  Result Value Ref Range   Color, Urine AMBER (A) YELLOW    Comment: BIOCHEMICALS MAY BE AFFECTED BY COLOR   APPearance HAZY (A) CLEAR   Specific Gravity, Urine 1.026 1.005 - 1.030   pH 5.0 5.0 -  8.0   Glucose, UA NEGATIVE NEGATIVE mg/dL   Hgb urine dipstick NEGATIVE NEGATIVE   Bilirubin Urine SMALL (A) NEGATIVE   Ketones, ur NEGATIVE NEGATIVE mg/dL   Protein, ur 244 (A) NEGATIVE mg/dL   Nitrite NEGATIVE NEGATIVE   Leukocytes,Ua NEGATIVE NEGATIVE   RBC / HPF 6-10 0 - 5 RBC/hpf   WBC, UA 0-5 0 - 5 WBC/hpf   Bacteria, UA RARE (A) NONE SEEN   Squamous Epithelial / LPF 0-5 0 - 5   Mucus PRESENT    Sperm, UA PRESENT     Comment: Performed at [facility], [address] [place], [place] [address]  Influenza panel by PCR (type A & B)     Status: None   Collection Time: 06/13/18  4:44 PM  Result Value Ref Range   Influenza A By PCR NEGATIVE NEGATIVE   Influenza B By PCR NEGATIVE NEGATIVE    Comment: (NOTE) The Xpert Xpress Flu assay is intended as an aid in the diagnosis of  influenza and should not be used as a sole basis for treatment.  This  assay is FDA approved for nasopharyngeal swab specimens only. Nasal  washings and aspirates are unacceptable for Xpert Xpress Flu testing. Performed at [facility], [address] [place], [place] [address]   Lactic acid, plasma     Status: Abnormal   Collection Time: 06/13/18  4:44 PM  Result Value Ref Range   Lactic Acid, Venous 2.7 (HH) 0.5 - 1.9 mmol/L    Comment: CRITICAL RESULT CALLED TO, READ BACK BY AND VERIFIED WITH Rafik Koppel AT 1735 06/13/2018.PMF Performed at [facility], [address] [place], [place] [address]    Dg Chest Port 1 View  Result Date: 06/13/2018 CLINICAL DATA:  Cough, weakness EXAM: PORTABLE CHEST 1 VIEW COMPARISON:  04/20/2016 FINDINGS: Consolidation noted in both lower lobes, right greater than left concerning for pneumonia. Heart is normal size. No effusions. No acute bony abnormality. IMPRESSION: Bilateral lower lobe airspace consolidations, right greater than left compatible with pneumonia. Electronically Signed   By: Charlett Nose M.D.   On: 06/13/2018 16:46     Pending Labs Unresulted Labs (From admission, onward)    Start     Ordered   06/13/18 1619  Lactic acid, plasma  Now then every 2 hours,   STAT     06/13/18 1618  06/13/18 1618  Comprehensive metabolic panel  ONCE - STAT,   STAT     06/13/18 1618   06/13/18 1618  Urinalysis, Routine w reflex microscopic  ONCE - STAT,   STAT     06/13/18 1618   06/13/18 1554  Blood culture (routine x 2)  BLOOD CULTURE X 2,   STAT     06/13/18 1553   06/13/18 1546  Troponin I - ONCE - STAT  ONCE - STAT,   STAT     06/13/18 1545   Signed and Held  Basic metabolic panel  Tomorrow morning,   R     Signed and Held   Signed and Held  CBC  Tomorrow morning,   R     Signed and Held   Signed and Held  Creatinine, serum  (enoxaparin (LOVENOX)    CrCl >/= 30 ml/min)  Weekly,   R    Comments:  while on enoxaparin therapy    Signed and Held   Signed and Held  Magnesium  Add-on,   R     Signed and Held   Signed and Held  Procalcitonin  ONCE - STAT,   R     Signed and Held   Signed and Held  Protime-INR  ONCE - STAT,   R     Signed and Held   Signed and Held  APTT  ONCE - STAT,   R     Signed and Held   Signed and Held  Respiratory Panel by PCR  (Respiratory virus panel with precautions)  ONCE - STAT,   R     Signed and Held          Vitals/Pain Today's Vitals   06/13/18 1615 06/13/18 1716 06/13/18 1730 06/13/18 1747  BP: (!) 112/100 118/77 120/83 108/87  Pulse: (!) 110 (!) 108  (!) 104  Resp: (!) 36 (!) 28 (!) 36 (!) 32  Temp:      TempSrc:      SpO2: 91% 93%  95%  Weight:      Height:      PainSc: 0-No pain 0-No pain      Isolation Precautions Droplet precaution  Medications Medications  ipratropium-albuterol (DUONEB) 0.5-2.5 (3) MG/3ML nebulizer solution 3 mL (3 mLs Nebulization Given 06/13/18 1617)  ipratropium-albuterol (DUONEB) 0.5-2.5 (3) MG/3ML nebulizer solution 3 mL (3 mLs Nebulization Given 06/13/18 1617)  ipratropium-albuterol (DUONEB) 0.5-2.5 (3) MG/3ML nebulizer solution 3 mL  (3 mLs Nebulization Given 06/13/18 1617)  sodium chloride 0.9 % bolus 1,000 mL (1,000 mLs Intravenous Transfusing/Transfer 06/13/18 1740)  cefTRIAXone (ROCEPHIN) 1 g in sodium chloride 0.9 % 100 mL IVPB (0 g Intravenous Stopped 06/13/18 1745)  azithromycin (ZITHROMAX) tablet 500 mg (500 mg Oral Given 06/13/18 1745)  methylPREDNISolone sodium succinate (SOLU-MEDROL) 125 mg/2 mL injection 125 mg (125 mg Intravenous Given 06/13/18 1652)  sodium chloride 0.9 % bolus 1,000 mL (1,000 mLs Intravenous Transfusing/Transfer 06/13/18 1740)    Mobility walks High fall risk   Focused Assessments Pulmonary Assessment Handoff:  Lung sounds: Bilateral Breath Sounds: Diminished O2 Device: Nasal Cannula O2 Flow Rate (L/min): 4 L/min      R Recommendations: See Admitting Provider Note  Report given to:   Additional Notes:

## 2018-06-13 NOTE — ED Notes (Signed)
Dr. Don Perking aware of lactic acid of 2.7.

## 2018-06-13 NOTE — ED Notes (Signed)
Daughter is with patient.

## 2018-06-13 NOTE — Progress Notes (Signed)
Pharmacy Antibiotic Note  Tony Fowler is a 73 y.o. male admitted on 06/13/2018 with pneumonia.  Pharmacy has been consulted for ceftriaxone and azithromycin dosing.  Plan: Patient received ceftriaxone in the ED. No note of allergy in notes. Allergy report is rash. Will start the patient on ceftriaxone 1 g q24H and azithromycin 500 mg q24H.   Height: 5\' 6"  (167.6 cm) Weight: 137 lb (62.1 kg) IBW/kg (Calculated) : 63.8  Temp (24hrs), Avg:98.9 F (37.2 C), Min:98.9 F (37.2 C), Max:98.9 F (37.2 C)  Recent Labs  Lab 06/13/18 1544 06/13/18 1644  WBC 19.6*  --   CREATININE 1.42*  --   LATICACIDVEN  --  2.7*    Estimated Creatinine Clearance: 41.9 mL/min (A) (by C-G formula based on SCr of 1.42 mg/dL (H)).    Allergies  Allergen Reactions  . Penicillins Rash    Childhood allergy, more than 30 years ago. No hospital required.    Antimicrobials this admission: 3/6 ceftriaxone >>  3/6 azithro >>   Dose adjustments this admission: none  Microbiology results: 3/7 BCx: pending  Thank you for allowing pharmacy to be a part of this patient's care.  Ronnald Ramp, PharmD, BCPS 06/13/2018 6:13 PM

## 2018-06-13 NOTE — ED Triage Notes (Signed)
Patient presents to the ED with cough/congestion since Sunday and some intermittent dizziness and weakness since Wednesday.  Patient fell on Wednesday and has some bruising to the right side of his face.  Patient is a patient at [facility] and has recently had medication changes with changing from depakote to tegretol and reduced patient's gabapentin.

## 2018-06-13 NOTE — Progress Notes (Signed)
Advanced Care Plan.  Purpose of Encounter: CODE STATUS. Parties in Attendance: The patient, his daughter, RN and me.  Patient's Decisional Capacity: Yes. Medical Story: Tony Fowler  is a 74 y.o. male with a known history of bipolar disorder, coarse tremors and hypertension.  The patient is being admitted for acute respiratory failure with hypoxia due to sepsis and pneumonia, acute renal failure due to dehydration.  I discussed with the patient's about his current condition, prognosis and CODE STATUS.  Initially the patient did not want to be resuscitated or intubated if he has cardiopulmonary arrest.  Then after discussing with his daughter, he changed his mind and decided to be resuscitated and intubated if he has cardiopulmonary arrest. Plan:  Code Status: Full code. Time spent discussing advance care planning: 18 minutes.

## 2018-06-13 NOTE — Progress Notes (Signed)
CODE SEPSIS - PHARMACY COMMUNICATION  **Broad Spectrum Antibiotics should be administered within 1 hour of Sepsis diagnosis**  Time Code Sepsis Called/Page Received: 1618  Antibiotics Ordered: ceftriaxone and azithromycin  Time of 1st antibiotic administration: 1715   Ronnald Ramp ,PharmD, BCPS Clinical Pharmacist  06/13/2018  4:53 PM

## 2018-06-13 NOTE — Progress Notes (Signed)
Critical lactic- 2.9 reported to Dr. Imogene Burn. No new orders at this time.

## 2018-06-14 ENCOUNTER — Inpatient Hospital Stay: Payer: Medicare Other

## 2018-06-14 LAB — BASIC METABOLIC PANEL
Anion gap: 10 (ref 5–15)
BUN: 27 mg/dL — ABNORMAL HIGH (ref 8–23)
CO2: 20 mmol/L — AB (ref 22–32)
Calcium: 9.4 mg/dL (ref 8.9–10.3)
Chloride: 107 mmol/L (ref 98–111)
Creatinine, Ser: 1.57 mg/dL — ABNORMAL HIGH (ref 0.61–1.24)
GFR calc Af Amer: 51 mL/min — ABNORMAL LOW (ref >60)
GFR calc non Af Amer: 44 mL/min — ABNORMAL LOW (ref >60)
Glucose, Bld: 174 mg/dL — ABNORMAL HIGH (ref 70–99)
Potassium: 4.8 mmol/L (ref 3.5–5.1)
Sodium: 137 mmol/L (ref 135–145)

## 2018-06-14 LAB — LACTIC ACID, PLASMA
Lactic Acid, Venous: 1.3 mmol/L (ref 0.5–1.9)
Lactic Acid, Venous: 2.8 mmol/L (ref 0.5–1.9)

## 2018-06-14 MED ORDER — METHYLPREDNISOLONE SODIUM SUCC 40 MG IJ SOLR
40.0000 mg | Freq: Two times a day (BID) | INTRAMUSCULAR | Status: DC
  Administered 2018-06-14 – 2018-06-15 (×3): 40 mg via INTRAVENOUS
  Filled 2018-06-14 (×3): qty 1

## 2018-06-14 MED ORDER — BUDESONIDE 0.5 MG/2ML IN SUSP
0.5000 mg | Freq: Two times a day (BID) | RESPIRATORY_TRACT | Status: DC
  Administered 2018-06-14 – 2018-06-18 (×8): 0.5 mg via RESPIRATORY_TRACT
  Filled 2018-06-14 (×8): qty 2

## 2018-06-14 MED ORDER — SODIUM CHLORIDE 0.9 % IV SOLN
2.0000 g | INTRAVENOUS | Status: DC
  Administered 2018-06-14 – 2018-06-16 (×3): 2 g via INTRAVENOUS
  Filled 2018-06-14 (×3): qty 2
  Filled 2018-06-14: qty 20

## 2018-06-14 MED ORDER — SODIUM CHLORIDE 0.9 % IV BOLUS
1000.0000 mL | Freq: Once | INTRAVENOUS | Status: AC
  Administered 2018-06-14: 1000 mL via INTRAVENOUS

## 2018-06-14 NOTE — Progress Notes (Signed)
Wean off oxygen. No falls. The patient was up in the chair for about  2 hours. Incentive spirometer provided.

## 2018-06-14 NOTE — Progress Notes (Addendum)
Patient ID: Tony Fowler, male   DOB: 06/11/1945, 73 y.o.   MRN: [id number]  [employer] PROGRESS NOTE  Tony Fowler HYQ:[id number] DOB: 10-07-1945 DOA: 06/13/2018 PCP: Tony Sizer, MD  HPI/Subjective: Patient with dizziness, room spinning, difficulty walking.  Also having shortness of breath dry cough and some nausea.  He does not wear oxygen at home.  Objective: Vitals:   06/14/18 0914 06/14/18 1214  BP: 107/71 116/73  Pulse: (!) 104 94  Resp: 18 (!) 24  Temp: 98 F (36.7 C) 98.9 F (37.2 C)  SpO2: 94% 94%    Filed Weights   06/13/18 1520  Weight: 62.1 kg    ROS: Review of Systems  Constitutional: Positive for malaise/fatigue. Negative for chills and fever.  Eyes: Negative for blurred vision.  Respiratory: Positive for cough and shortness of breath.   Cardiovascular: Negative for chest pain.  Gastrointestinal: Positive for nausea. Negative for abdominal pain, constipation, diarrhea and vomiting.  Genitourinary: Negative for dysuria.  Musculoskeletal: Negative for joint pain.  Neurological: Positive for dizziness. Negative for headaches.   Exam: Physical Exam  Constitutional: He is oriented to person, place, and time.  HENT:  Fowler: No mucosal edema.  Mouth/Throat: No oropharyngeal exudate or posterior oropharyngeal edema.  Right tympanic membrane cloudy.  Left tympanic membrane normal  Eyes: Pupils are equal, round, and reactive to light. Conjunctivae, EOM and lids are normal.  Neck: No JVD present. Carotid bruit is not present. No edema present. No thyroid mass and no thyromegaly present.  Cardiovascular: S1 normal and S2 normal. Exam reveals no gallop.  No murmur heard. Pulses:      Dorsalis pedis pulses are 2+ on the right side and 2+ on the left side.  Respiratory: No respiratory distress. He has decreased breath sounds in the right middle field, the right lower field, the left middle field and the left lower field. He has wheezes in the right middle  field, the right lower field, the left middle field and the left lower field. He has no rhonchi. He has no rales.  GI: Soft. Bowel sounds are normal. There is no abdominal tenderness.  Large right inguinal hernia which is reducible but came right back out when I let go.  Musculoskeletal:     Left knee: He exhibits deformity. He exhibits no swelling and no effusion. No tenderness found. No medial joint line and no lateral joint line tenderness noted.     Right ankle: He exhibits no swelling.     Left ankle: He exhibits no swelling.  Lymphadenopathy:    He has no cervical adenopathy.  Neurological: He is alert and oriented to person, place, and time. No cranial nerve deficit.  Skin: Skin is warm. No rash noted. Nails show no clubbing.  Psychiatric: He has a normal mood and affect.      Data Reviewed: Basic Metabolic Panel: Recent Labs  Lab 06/13/18 1544 06/13/18 1833 06/13/18 2343  NA 134* 136 137  K 4.4 4.3 4.8  CL 103 106 107  CO2 18* 21* 20*  GLUCOSE 171* 150* 174*  BUN 25* 25* 27*  CREATININE 1.42* 1.42* 1.57*  CALCIUM 10.0 8.8* 9.4  MG  --  1.7  --    Liver Function Tests: Recent Labs  Lab 06/13/18 1833  AST 26  ALT 18  ALKPHOS 49  BILITOT 0.7  PROT 6.1*  ALBUMIN 2.5*   CBC: Recent Labs  Lab 06/13/18 1544 06/13/18 2343  WBC 19.6* 15.7*  HGB 14.1 12.1*  HCT  41.1 36.5*  MCV 90.1 93.1  PLT 258 233   Cardiac Enzymes: Recent Labs  Lab 06/13/18 1833  TROPONINI 0.03*   BNP (last 3 results) Recent Labs    06/13/18 1546  BNP 204.0*     Recent Results (from the past 240 hour(s))  Blood culture (routine x 2)     Status: None (Preliminary result)   Collection Time: 06/13/18  3:44 PM  Result Value Ref Range Status   Specimen Description BLOOD LEFT FA  Final   Special Requests   Final    BOTTLES DRAWN AEROBIC AND ANAEROBIC Blood Culture results may not be optimal due to an inadequate volume of blood received in culture bottles   Culture   Final    NO  GROWTH < 24 HOURS Performed at [facility], [address] [place], [place] [address]    Report Status PENDING  Incomplete  Blood culture (routine x 2)     Status: None (Preliminary result)   Collection Time: 06/13/18  4:43 PM  Result Value Ref Range Status   Specimen Description BLOOD RT WRIST  Final   Special Requests   Final    BOTTLES DRAWN AEROBIC AND ANAEROBIC Blood Culture adequate volume   Culture   Final    NO GROWTH < 24 HOURS Performed at [facility], [address], [place], [place] [address]    Report Status PENDING  Incomplete     Studies: Dg Chest Port 1 View  Result Date: 06/13/2018 CLINICAL DATA:  Cough, weakness EXAM: PORTABLE CHEST 1 VIEW COMPARISON:  04/20/2016 FINDINGS: Consolidation noted in both lower lobes, right greater than left concerning for pneumonia. Heart is normal size. No effusions. No acute bony abnormality. IMPRESSION: Bilateral lower lobe airspace consolidations, right greater than left compatible with pneumonia. Electronically Signed   By: Tony Fowler M.D.   On: 06/13/2018 16:46    Scheduled Meds: . albuterol  2.5 mg Nebulization Q6H WA  . ARIPiprazole  5 mg Oral Daily  . aspirin EC  81 mg Oral Daily  . budesonide (PULMICORT) nebulizer solution  0.5 mg Nebulization BID  . carbamazepine  200 mg Oral TID  . enoxaparin (LOVENOX) injection  40 mg Subcutaneous Q24H  . fluticasone  2 spray Each Nare Daily  . gabapentin  300 mg Oral TID  . methylPREDNISolone (SOLU-MEDROL) injection  40 mg Intravenous Q12H  . pantoprazole  40 mg Oral Daily  . tamsulosin  0.4 mg Oral Daily   Continuous Infusions: . sodium chloride 100 mL/hr at 06/14/18 0526  . azithromycin    . cefTRIAXone (ROCEPHIN)  IV      Assessment/Plan:  1. Acute hypoxic respiratory failure.  Oxygen supplementation.  Taper oxygen down as much as possible.  Patient does not wear oxygen at home so hopefully still be able to get off oxygen. 2. Clinical sepsis with  pneumonia.  Patient on Rocephin and Zithromax. 3. COPD exacerbation with bronchospasm.  Continue Solu-Medrol and nebulizer treatments. 4. Dizziness.  We will get physical therapy evaluation.  Antibiotics will treat infection.  Patient getting IV fluids.  Will put in PRN meclizine. 5. GERD on PPI 6. BPH on Flomax 7. Bipolar disorder on numerous psychiatric medications 8. Weakness and dizziness.  Physical therapy evaluation 9. Left knee pain.  X-ray ordered.  Results still pending. 10. Creatinine no improvement with IV fluids.  Could be chronic kidney disease stage III  Code Status:     Code Status Orders  (From admission, onward)  Start     Ordered   06/13/18 1813  Full code  Continuous     06/13/18 1812        Code Status History    Date Active Date Inactive Code Status Order ID Comments User Context   12/01/2015 2039 12/14/2015 2015 Full Code [id number]  Clapacs, Jackquline Denmark, MD Inpatient     Family Communication: Spoke with 2 daughters at the bedside Disposition Plan: We will need to breathe better and hopefully get off oxygen prior to disposition  Antibiotics:  Rocephin  Zithromax  Time spent: 30 minutes  Rhiley Tarver [employer]

## 2018-06-14 NOTE — Progress Notes (Signed)
MD was paged due to patient elevated lactic acid. Per MD will bolus 1 Liter.

## 2018-06-14 NOTE — Clinical Social Work Note (Signed)
The CSW received a consult that the patient would like to discuss options for changing his outpatient mental health provider. The CSW visited the patient at bedside. His daughter was present; the patient requested that she remain during the discussion. The CSW provided the client with a list of providers in [place] and [place] who both accept Medicare and have current openings for older adults. The patient and his daughter thanked the CSW and indicated no further needs. The CSW is signing off. Please consult should needs arise.  Argentina Ponder, MSW, LCSW [phone]

## 2018-06-15 ENCOUNTER — Inpatient Hospital Stay: Payer: Medicare Other

## 2018-06-15 DIAGNOSIS — E440 Moderate protein-calorie malnutrition: Secondary | ICD-10-CM

## 2018-06-15 LAB — BASIC METABOLIC PANEL
Anion gap: 8 (ref 5–15)
BUN: 33 mg/dL — ABNORMAL HIGH (ref 8–23)
CO2: 18 mmol/L — ABNORMAL LOW (ref 22–32)
Calcium: 10 mg/dL (ref 8.9–10.3)
Chloride: 113 mmol/L — ABNORMAL HIGH (ref 98–111)
Creatinine, Ser: 1.07 mg/dL (ref 0.61–1.24)
GFR calc Af Amer: >60 mL/min (ref >60)
GFR calc non Af Amer: >60 mL/min (ref >60)
GLUCOSE: 152 mg/dL — AB (ref 70–99)
Potassium: 4.2 mmol/L (ref 3.5–5.1)
Sodium: 139 mmol/L (ref 135–145)

## 2018-06-15 LAB — RESPIRATORY PANEL BY PCR
Adenovirus: NOT DETECTED
Bordetella pertussis: NOT DETECTED
CORONAVIRUS OC43-RVPPCR: NOT DETECTED
Chlamydophila pneumoniae: NOT DETECTED
Coronavirus 229E: NOT DETECTED
Coronavirus HKU1: NOT DETECTED
Coronavirus NL63: NOT DETECTED
Influenza A: NOT DETECTED
Influenza B: NOT DETECTED
Metapneumovirus: NOT DETECTED
Mycoplasma pneumoniae: NOT DETECTED
PARAINFLUENZA VIRUS 4-RVPPCR: NOT DETECTED
Parainfluenza Virus 1: NOT DETECTED
Parainfluenza Virus 2: NOT DETECTED
Parainfluenza Virus 3: NOT DETECTED
Respiratory Syncytial Virus: NOT DETECTED
Rhinovirus / Enterovirus: NOT DETECTED

## 2018-06-15 LAB — CBC
HCT: 34.1 % — ABNORMAL LOW (ref 39.0–52.0)
Hemoglobin: 11.2 g/dL — ABNORMAL LOW (ref 13.0–17.0)
MCH: 31 pg (ref 26.0–34.0)
MCHC: 32.8 g/dL (ref 30.0–36.0)
MCV: 94.5 fL (ref 80.0–100.0)
Platelets: 269 10*3/uL (ref 150–400)
RBC: 3.61 MIL/uL — ABNORMAL LOW (ref 4.22–5.81)
RDW: 14.4 % (ref 11.5–15.5)
WBC: 17.8 10*3/uL — ABNORMAL HIGH (ref 4.0–10.5)
nRBC: 0 % (ref 0.0–0.2)

## 2018-06-15 MED ORDER — ADULT MULTIVITAMIN W/MINERALS CH
1.0000 | ORAL_TABLET | Freq: Every day | ORAL | Status: DC
  Administered 2018-06-15 – 2018-06-18 (×4): 1 via ORAL
  Filled 2018-06-15 (×4): qty 1

## 2018-06-15 MED ORDER — METHYLPREDNISOLONE SODIUM SUCC 40 MG IJ SOLR
40.0000 mg | Freq: Every day | INTRAMUSCULAR | Status: DC
  Administered 2018-06-16 – 2018-06-18 (×3): 40 mg via INTRAVENOUS
  Filled 2018-06-15 (×3): qty 1

## 2018-06-15 MED ORDER — MECLIZINE HCL 12.5 MG PO TABS
12.5000 mg | ORAL_TABLET | Freq: Once | ORAL | Status: AC
  Administered 2018-06-15: 12.5 mg via ORAL
  Filled 2018-06-15: qty 1

## 2018-06-15 MED ORDER — MECLIZINE HCL 12.5 MG PO TABS
12.5000 mg | ORAL_TABLET | Freq: Three times a day (TID) | ORAL | Status: DC | PRN
  Filled 2018-06-15: qty 1

## 2018-06-15 MED ORDER — ENSURE ENLIVE PO LIQD
237.0000 mL | Freq: Three times a day (TID) | ORAL | Status: DC
  Administered 2018-06-15 – 2018-06-17 (×7): 237 mL via ORAL

## 2018-06-15 MED ORDER — GADOBUTROL 1 MMOL/ML IV SOLN
6.0000 mL | Freq: Once | INTRAVENOUS | Status: AC | PRN
  Administered 2018-06-15: 6 mL via INTRAVENOUS

## 2018-06-15 NOTE — Progress Notes (Signed)
Initial Nutrition Assessment  DOCUMENTATION CODES:   Non-severe (moderate) malnutrition in context of acute illness/injury  INTERVENTION:   - Ensure Enlive po TID, each supplement provides 350 kcal and 20 grams of protein  - Recommend liberalizing diet to Regular  - MVI with minerals daily  NUTRITION DIAGNOSIS:   Moderate Malnutrition related to acute illness (acute respirtaory failure with hypoxia due to sepsis and PNA) as evidenced by mild fat depletion, moderate muscle depletion.  GOAL:   Patient will meet greater than or equal to 90% of their needs  MONITOR:   PO intake, Supplement acceptance, I & O's, Weight trends  REASON FOR ASSESSMENT:   Malnutrition Screening Tool    ASSESSMENT:   73 year old male who presented to the ED on 3/7 with cough, SOB, dizziness. PMH of HTN, bipolar disorder, GERD. Pt admitted with acute respiratory failure with hypoxia due to sepsis and PNA.  Spoke with pt at bedside who was eating lunch at time of visit. Pt minimally interactive during RD interview, typically answering questions with one or two words.  Pt states that his appetite has been poor since being admitted to the hospital. Pt states that it is because he does not feel well. Pt reports having a normal appetite PTA and eating 3 "normal" meals daily that he would cook for himself. Pt denies any difficulties chewing or swallowing.  Pt states he does not know if he last lost weight. Pt reports his UBW as 138 lbs. Per weight history in chart, pt has lost 5.3 kg over the last 1 year. This is a 7.9% weight loss which is not significant for timeframe.  Pt brightens up visibly when asked if he would like an oral nutrition supplement. Pt states that he would and does not care what flavor it is because he likes them all.  Pt states that he ambulates using a walker at home.  Meal Completion: 90-100%  Medications reviewed and include: Solu-medrol, Protonix, IV antibiotics  Labs reviewed:  BUN 33 (H)  UOP: 550 ml x 24 hours I/O's: +2.7 L since admit  NUTRITION - FOCUSED PHYSICAL EXAM:    Most Recent Value  Orbital Region  Moderate depletion  Upper Arm Region  Mild depletion  Thoracic and Lumbar Region  Mild depletion  Buccal Region  Mild depletion  Temple Region  Moderate depletion  Clavicle Bone Region  Moderate depletion  Clavicle and Acromion Bone Region  Moderate depletion  Scapular Bone Region  Moderate depletion  Dorsal Hand  Mild depletion  Patellar Region  Mild depletion  Anterior Thigh Region  Moderate depletion  Posterior Calf Region  Moderate depletion  Edema (RD Assessment)  None  Hair  Reviewed  Eyes  Reviewed  Mouth  Reviewed  Skin  Reviewed  Nails  Reviewed       Diet Order:   Diet Order            Diet regular Room service appropriate? Yes; Fluid consistency: Thin  Diet effective now              EDUCATION NEEDS:   No education needs have been identified at this time  Skin:  Skin Assessment: Reviewed RN Assessment  Last BM:  06/13/18  Height:   Ht Readings from Last 1 Encounters:  06/13/18 5\' 6"  (1.676 m)    Weight:   Wt Readings from Last 1 Encounters:  06/13/18 62.1 kg    Ideal Body Weight:  64.5 kg  BMI:  Body mass index is 22.11  kg/m.  Estimated Nutritional Needs:   Kcal:  1600-1800  Protein:  80-95 grams  Fluid:  >/= 1.5 L    Earma Reading, MS, RD, LDN Inpatient Clinical Dietitian Pager: [phone] Weekend/After Hours: [phone]

## 2018-06-15 NOTE — Evaluation (Signed)
Physical Therapy Evaluation Patient Details Name: Tony Fowler MRN: [id number] DOB: 02-12-1946 Today's Date: 06/15/2018   History of Present Illness  From MD H&P:  Pt is a 73 y.o. male with a known history of bipolar disorder, coarse tremors and hypertension.  The patient was sent from home to ED due to SOB and dizziness with complaints of fever chills, nausea, decreased appetite, cough with phlegm and worsening shortness of breath.  The patient was found with hypoxia at 85% and put on oxygen by nasal cannula 4 L.  He also had tachycardia, tachypnea, leukocytosis, chest x-ray show bilateral basilar pneumonia. Assessment inlcudes: Acute hypoxic respiratory failure, Clinical sepsis with pneumonia, COPD exacerbation, dizziness, bipolar disorder, and L knee pain.    Clinical Impression  Pt presents with deficits in strength, transfers, mobility, gait, balance, and activity tolerance.  Pt required extra time and effort with bed mobility tasks but no physical assistance.  Pt was CGA with transfers with verbal cues for general sequencing.  Pt was able to amb with a RW 125' with slow cadence and with drifting left/right that improved with cues.  Pt was able to amb 2 x 30' without an AD and CGA but with decreased stability grossly and with reaching out for UE support.  Strength deficits noted but equal L vs R to BUEs and BLEs.  Sensation to light touch and proprioception equal L vs R.  Pt will benefit from HHPT services upon discharge along with supervision with mobility to safely address above deficits for decreased caregiver assistance and eventual return to PLOF.      Follow Up Recommendations Home health PT;Supervision for mobility/OOB    Equipment Recommendations  Rolling walker with 5" wheels    Recommendations for Other Services       Precautions / Restrictions Precautions Precautions: Fall Restrictions Weight Bearing Restrictions: No      Mobility  Bed Mobility Overal bed mobility:  Modified Independent             General bed mobility comments: Extra time and effort with bed mobility tasks but no physical assistance required  Transfers Overall transfer level: Needs assistance Equipment used: Rolling walker (2 wheeled) Transfers: Sit to/from Stand Sit to Stand: Min guard         General transfer comment: Min verbal cues for hand placement  Ambulation/Gait Ambulation/Gait assistance: Min guard Gait Distance (Feet): 125 Feet x 1 with RW, 30' x 2 without AD Assistive device: Rolling walker (2 wheeled)/ No AD Gait Pattern/deviations: Step-through pattern;Drifts right/left;Decreased step length - right;Decreased step length - left;Trunk flexed Gait velocity: decreased   General Gait Details: Slow cadence with amb with min-mod drifting left/right with verbal cues for upright posture and amb closer to the [employer]    Modified Rankin (Stroke Patients Only)       Balance Overall balance assessment: Needs assistance Sitting-balance support: Bilateral upper extremity supported Sitting balance-Leahy Scale: Fair     Standing balance support: Bilateral upper extremity supported;During functional activity;No upper extremity supported Standing balance-Leahy Scale: Fair                               Pertinent Vitals/Pain Pain Assessment: No/denies pain    Home Living Family/patient expects to be discharged to:: Private residence Living Arrangements: Children;Other (Comment)(daughter) Available Help at Discharge: Family;Available PRN/intermittently Type of Home: House Home Access: Stairs  to enter Entrance Stairs-Rails: Right Entrance Stairs-Number of Steps: 3 Home Layout: One level Home Equipment: Walker - 4 wheels;Cane - quad      Prior Function Level of Independence: Independent         Comments: Ind Amb without AD limited community distances, Ind with ADLs, no fall history     Hand  Dominance        Extremity/Trunk Assessment   Upper Extremity Assessment Upper Extremity Assessment: Generalized weakness    Lower Extremity Assessment Lower Extremity Assessment: Generalized weakness       Communication   Communication: No difficulties  Cognition Arousal/Alertness: Awake/alert Behavior During Therapy: WFL for tasks assessed/performed Overall Cognitive Status: Within Functional Limits for tasks assessed                                        General Comments      Exercises Total Joint Exercises Ankle Circles/Pumps: Strengthening;Both;10 reps Quad Sets: Strengthening;Both;10 reps Heel Slides: AROM;Both;10 reps Hip ABduction/ADduction: Strengthening;Both;10 reps Straight Leg Raises: Strengthening;Both;10 reps Long Arc Quad: Strengthening;Both;10 reps Knee Flexion: Strengthening;Both;10 reps   Assessment/Plan    PT Assessment Patient needs continued PT services  PT Problem List Decreased strength;Decreased activity tolerance;Decreased balance;Decreased mobility;Decreased knowledge of use of DME       PT Treatment Interventions DME instruction;Gait training;Stair training;Functional mobility training;Therapeutic activities;Therapeutic exercise;Balance training;Patient/family education    PT Goals (Current goals can be found in the Care Plan section)  Acute Rehab PT Goals Patient Stated Goal: To get stronger with better balance PT Goal Formulation: With patient Time For Goal Achievement: 06/28/18 Potential to Achieve Goals: Good    Frequency Min 2X/week   Barriers to discharge        Co-evaluation               AM-PAC PT "6 Clicks" Mobility  Outcome Measure Help needed turning from your back to your side while in a flat bed without using bedrails?: A Little Help needed moving from lying on your back to sitting on the side of a flat bed without using bedrails?: A Little Help needed moving to and from a bed to a chair  (including a wheelchair)?: A Little Help needed standing up from a chair using your arms (e.g., wheelchair or bedside chair)?: A Little Help needed to walk in hospital room?: A Little Help needed climbing 3-5 steps with a railing? : A Little 6 Click Score: 18    End of Session Equipment Utilized During Treatment: Gait belt Activity Tolerance: Patient tolerated treatment well Patient left: in bed;with bed alarm set;with call bell/phone within reach;Other (comment)(Pt declined up in chair ) Nurse Communication: Mobility status PT Visit Diagnosis: Unsteadiness on feet (R26.81);Difficulty in walking, not elsewhere classified (R26.2);Muscle weakness (generalized) (M62.81)    Time: 1735-6701 PT Time Calculation (min) (ACUTE ONLY): 34 min   Charges:   PT Evaluation $PT Eval Moderate Complexity: 1 Mod PT Treatments $Gait Training: 8-22 mins $Therapeutic Exercise: 8-22 mins        D. Scott Lesleyanne Politte PT, DPT 06/15/18, 1:30 PM

## 2018-06-15 NOTE — Progress Notes (Signed)
Per MD okay for RN to DC droplet precautions. respiratory panel was negative,

## 2018-06-15 NOTE — Progress Notes (Signed)
SATURATION QUALIFICATIONS: (This note is used to comply with regulatory documentation for home oxygen)  Patient Saturations on Room Air at Rest = 95%  Patient Saturations on Room Air while Ambulating = 93%   

## 2018-06-15 NOTE — Progress Notes (Signed)
Patient ID: Tony Fowler, male   DOB: 07/30/1945, 73 y.o.   MRN: [id number]  [employer] PROGRESS NOTE  Tony Fowler PFY:[id number] DOB: [birth year] DOA: 06/13/2018 PCP: Tony Fowler, Tony Fowler  HPI/Subjective: Patient still having some dizziness.  Patient breathing a little bit better than yesterday.  Still with some cough and shortness of breath.  Objective: Vitals:   06/15/18 0353 06/15/18 0911  BP: 135/86 (!) 145/82  Pulse: 90 95  Resp:  20  Temp: 97.9 F (36.6 C) 97.7 F (36.5 C)  SpO2: 91% 94%    Filed Weights   06/13/18 1520  Weight: 62.1 kg    ROS: Review of Systems  Constitutional: Positive for malaise/fatigue. Negative for chills and fever.  Eyes: Negative for blurred vision.  Respiratory: Positive for cough and shortness of breath.   Cardiovascular: Negative for chest pain.  Gastrointestinal: Positive for nausea. Negative for abdominal pain, constipation, diarrhea and vomiting.  Genitourinary: Negative for dysuria.  Musculoskeletal: Negative for joint pain.  Neurological: Positive for dizziness. Negative for headaches.   Exam: Physical Exam  Constitutional: He is oriented to person, place, and time.  HENT:  Nose: No mucosal edema.  Mouth/Throat: No oropharyngeal exudate or posterior oropharyngeal edema.  Right tympanic membrane cloudy.  Left tympanic membrane normal  Eyes: Pupils are equal, round, and reactive to light. Conjunctivae, EOM and lids are normal.  Neck: No JVD present. Carotid bruit is not present. No edema present. No thyroid mass and no thyromegaly present.  Cardiovascular: S1 normal and S2 normal. Exam reveals no gallop.  No murmur heard. Pulses:      Dorsalis pedis pulses are 2+ on the right side and 2+ on the left side.  Respiratory: No respiratory distress. He has decreased breath sounds in the right lower field and the left lower field. He has no wheezes. He has rhonchi in the right lower field and the left lower field. He has no rales.   GI: Soft. Bowel sounds are normal. There is no abdominal tenderness.  Large right inguinal hernia which is reducible but came right back out when I let go.  Musculoskeletal:     Left knee: He exhibits deformity. He exhibits no swelling and no effusion. No tenderness found. No medial joint line and no lateral joint line tenderness noted.     Right ankle: He exhibits no swelling.     Left ankle: He exhibits no swelling.  Lymphadenopathy:    He has no cervical adenopathy.  Neurological: He is alert and oriented to person, place, and time. No cranial nerve deficit.  Skin: Skin is warm. No rash noted. Nails show no clubbing.  Psychiatric: He has a normal mood and affect.      Data Reviewed: Basic Metabolic Panel: Recent Labs  Lab 06/13/18 1544 06/13/18 1833 06/13/18 2343 06/15/18 0415  NA 134* 136 137 139  K 4.4 4.3 4.8 4.2  CL 103 106 107 113*  CO2 18* 21* 20* 18*  GLUCOSE 171* 150* 174* 152*  BUN 25* 25* 27* 33*  CREATININE 1.42* 1.42* 1.57* 1.07  CALCIUM 10.0 8.8* 9.4 10.0  MG  --  1.7  --   --    Liver Function Tests: Recent Labs  Lab 06/13/18 1833  AST 26  ALT 18  ALKPHOS 49  BILITOT 0.7  PROT 6.1*  ALBUMIN 2.5*   CBC: Recent Labs  Lab 06/13/18 1544 06/13/18 2343 06/15/18 0415  WBC 19.6* 15.7* 17.8*  HGB 14.1 12.1* 11.2*  HCT 41.1 36.5* 34.1*  MCV 90.1 93.1 94.5  PLT 258 233 269   Cardiac Enzymes: Recent Labs  Lab 06/13/18 1833  TROPONINI 0.03*   BNP (last 3 results) Recent Labs    06/13/18 1546  BNP 204.0*     Recent Results (from the past 240 hour(s))  Blood culture (routine x 2)     Status: None (Preliminary result)   Collection Time: 06/13/18  3:44 PM  Result Value Ref Range Status   Specimen Description BLOOD LEFT FA  Final   Special Requests   Final    BOTTLES DRAWN AEROBIC AND ANAEROBIC Blood Culture results may not be optimal due to an inadequate volume of blood received in culture bottles   Culture   Final    NO GROWTH 2  DAYS Performed at [facility], [address] [place], [place] [address]    Report Status PENDING  Incomplete  Blood culture (routine x 2)     Status: None (Preliminary result)   Collection Time: 06/13/18  4:43 PM  Result Value Ref Range Status   Specimen Description BLOOD RT WRIST  Final   Special Requests   Final    BOTTLES DRAWN AEROBIC AND ANAEROBIC Blood Culture adequate volume   Culture   Final    NO GROWTH 2 DAYS Performed at [facility], [address] [place], [place] [address]    Report Status PENDING  Incomplete  Respiratory Panel by PCR     Status: None   Collection Time: 06/13/18  4:44 PM  Result Value Ref Range Status   Adenovirus NOT DETECTED NOT DETECTED Final   Coronavirus 229E NOT DETECTED NOT DETECTED Final    Comment: (NOTE) The Coronavirus on the Respiratory Panel, DOES NOT test for the novel  Coronavirus (2019 nCoV)    Coronavirus HKU1 NOT DETECTED NOT DETECTED Final   Coronavirus NL63 NOT DETECTED NOT DETECTED Final   Coronavirus OC43 NOT DETECTED NOT DETECTED Final   Metapneumovirus NOT DETECTED NOT DETECTED Final   Rhinovirus / Enterovirus NOT DETECTED NOT DETECTED Final   Influenza A NOT DETECTED NOT DETECTED Final   Influenza B NOT DETECTED NOT DETECTED Final   Parainfluenza Virus 1 NOT DETECTED NOT DETECTED Final   Parainfluenza Virus 2 NOT DETECTED NOT DETECTED Final   Parainfluenza Virus 3 NOT DETECTED NOT DETECTED Final   Parainfluenza Virus 4 NOT DETECTED NOT DETECTED Final   Respiratory Syncytial Virus NOT DETECTED NOT DETECTED Final   Bordetella pertussis NOT DETECTED NOT DETECTED Final   Chlamydophila pneumoniae NOT DETECTED NOT DETECTED Final   Mycoplasma pneumoniae NOT DETECTED NOT DETECTED Final    Comment: Performed at [facility], [address] [place], [place] [address]     Studies: Dg Knee 1-2 Views Left  Result Date: 06/14/2018 CLINICAL DATA:  Chronic left knee pain. EXAM: LEFT KNEE - 1-2 VIEW  COMPARISON:  None. FINDINGS: Vascular calcifications. Mild tricompartmental degenerative changes. No fracture, dislocation, or joint effusion. IMPRESSION: Mild tricompartmental degenerative changes. Electronically Signed   By: Gerome Sam III M.D   On: 06/14/2018 15:56   Mr Brain W Wo Contrast  Result Date: 06/15/2018 CLINICAL DATA:  Vertigo EXAM: MRI HEAD WITHOUT AND WITH CONTRAST TECHNIQUE: Multiplanar, multiecho pulse sequences of the brain and surrounding structures were obtained without and with intravenous contrast. CONTRAST:  6 mL Gadavist COMPARISON:  Brain MRI 07/08/2016 FINDINGS: BRAIN: There is no acute infarct, acute hemorrhage, hydrocephalus or extra-axial collection. The midline structures are normal. No midline shift or other  mass effect. Multifocal white matter hyperintensity, most commonly due to chronic ischemic microangiopathy. Generalized atrophy without lobar predilection. Susceptibility-sensitive sequences show no chronic microhemorrhage or superficial siderosis. VASCULAR: Major intracranial arterial and venous sinus flow voids are normal. SKULL AND UPPER CERVICAL SPINE: Calvarial bone marrow signal is normal. There is no skull base mass. Visualized upper cervical spine and soft tissues are normal. SINUSES/ORBITS: Moderate right maxillary sinus mucosal thickening. Small bilateral mastoid effusions. The orbits are normal. IMPRESSION: 1. No acute intracranial abnormality. 2. Generalized atrophy and chronic microvascular ischemia. Electronically Signed   By: Deatra Robinson M.D.   On: 06/15/2018 13:42   Dg Chest Port 1 View  Result Date: 06/13/2018 CLINICAL DATA:  Cough, weakness EXAM: PORTABLE CHEST 1 VIEW COMPARISON:  04/20/2016 FINDINGS: Consolidation noted in both lower lobes, right greater than left concerning for pneumonia. Heart is normal size. No effusions. No acute bony abnormality. IMPRESSION: Bilateral lower lobe airspace consolidations, right greater than left compatible with  pneumonia. Electronically Signed   By: Charlett Nose M.D.   On: 06/13/2018 16:46    Scheduled Meds: . albuterol  2.5 mg Nebulization Q6H WA  . ARIPiprazole  5 mg Oral Daily  . aspirin EC  81 mg Oral Daily  . budesonide (PULMICORT) nebulizer solution  0.5 mg Nebulization BID  . carbamazepine  200 mg Oral TID  . enoxaparin (LOVENOX) injection  40 mg Subcutaneous Q24H  . feeding supplement (ENSURE ENLIVE)  237 mL Oral TID BM  . fluticasone  2 spray Each Nare Daily  . gabapentin  300 mg Oral TID  . [START ON 06/16/2018] methylPREDNISolone (SOLU-MEDROL) injection  40 mg Intravenous Daily  . multivitamin with minerals  1 tablet Oral Daily  . pantoprazole  40 mg Oral Daily  . tamsulosin  0.4 mg Oral Daily   Continuous Infusions: . azithromycin Stopped (06/14/18 1944)  . cefTRIAXone (ROCEPHIN)  IV Stopped (06/14/18 1830)    Assessment/Plan:  1. Acute hypoxic respiratory failure.  Oxygen tapered to off. 2. Clinical sepsis with pneumonia.  Patient on Rocephin and Zithromax.  Lactic acidosis on presentation has improved.  White blood cell count still elevated secondary to steroids 3. COPD exacerbation with bronchospasm.  Lungs with better breath sounds today.  Decrease Solu-Medrol to 40 mg daily because the patient having difficulty sleeping.  Continue nebulizer treatments. 4. Dizziness.  MRI of the brain negative.  Patient referred to neck step therapy and balance center for continued dizziness.  Trial of meclizine. 5. Acute kidney injury on chronic kidney disease stage II.  Creatinine improved to 1.07 6. GERD on PPI 7. BPH on Flomax 8. Bipolar disorder on numerous psychiatric medications 9. Weakness and dizziness.  Physical therapy evaluation 10. Left knee pain.  X-ray done.  Will need to follow-up with orthopedic surgery as outpatient. 11. Large right inguinal hernia.  Can follow-up with general surgery as outpatient for respiratory symptoms need to resolve prior to any procedure.  Code  Status:     Code Status Orders  (From admission, onward)         Start     Ordered   06/13/18 1813  Full code  Continuous     06/13/18 1812        Code Status History    Date Active Date Inactive Code Status Order ID Comments User Context   12/01/2015 2039 12/14/2015 2015 Full Code [id number]  Clapacs, Tony Denmark, Tony Fowler Inpatient     Family Communication: Spoke with 2 daughters at the bedside yesterday Disposition  Plan: Evaluate on a day-to-day basis on when to go home  Antibiotics:  Rocephin  Zithromax  Time spent: 28 minutes  Charlean Carneal [employer]

## 2018-06-16 ENCOUNTER — Inpatient Hospital Stay: Payer: Medicare Other

## 2018-06-16 LAB — GASTROINTESTINAL PANEL BY PCR, STOOL (REPLACES STOOL CULTURE)

## 2018-06-16 LAB — C DIFFICILE QUICK SCREEN W PCR REFLEX
C Diff antigen: NEGATIVE
C Diff interpretation: NOT DETECTED
C Diff toxin: NEGATIVE

## 2018-06-16 MED ORDER — HYDROCOD POLST-CPM POLST ER 10-8 MG/5ML PO SUER
5.0000 mL | Freq: Two times a day (BID) | ORAL | Status: DC | PRN
  Administered 2018-06-16 (×2): 5 mL via ORAL
  Filled 2018-06-16 (×2): qty 5

## 2018-06-16 MED ORDER — SODIUM CHLORIDE 0.9 % IV SOLN
INTRAVENOUS | Status: AC
  Administered 2018-06-16: 18:00:00 via INTRAVENOUS

## 2018-06-16 MED ORDER — METHYLPREDNISOLONE SODIUM SUCC 40 MG IJ SOLR
40.0000 mg | Freq: Once | INTRAMUSCULAR | Status: AC
  Administered 2018-06-16: 40 mg via INTRAVENOUS
  Filled 2018-06-16: qty 1

## 2018-06-16 MED ORDER — IPRATROPIUM-ALBUTEROL 0.5-2.5 (3) MG/3ML IN SOLN
3.0000 mL | Freq: Four times a day (QID) | RESPIRATORY_TRACT | Status: DC
  Administered 2018-06-16 – 2018-06-18 (×11): 3 mL via RESPIRATORY_TRACT
  Filled 2018-06-16 (×11): qty 3

## 2018-06-16 MED ORDER — IPRATROPIUM-ALBUTEROL 0.5-2.5 (3) MG/3ML IN SOLN
3.0000 mL | RESPIRATORY_TRACT | Status: DC | PRN
  Administered 2018-06-16: 3 mL via RESPIRATORY_TRACT
  Filled 2018-06-16: qty 3

## 2018-06-16 MED ORDER — LISINOPRIL 10 MG PO TABS
10.0000 mg | ORAL_TABLET | Freq: Every day | ORAL | Status: DC
  Administered 2018-06-16 – 2018-06-18 (×3): 10 mg via ORAL
  Filled 2018-06-16 (×3): qty 1

## 2018-06-16 NOTE — Progress Notes (Signed)
MD notified of pt increased RR - 35. And increased SOB and wheezing. Pt O2 sats 93% on 2LO2NC. Albuteral tx given with little improvement. MD orders for Duoneb, IV solumedrol 40mg  x 1 now, Stat CXR and tussonex  PRN for cough. Duoneb, cough meds, and solumedrol given. Pt states he feels some improvement. Awaiting CXR results. Will continue to monitor.

## 2018-06-16 NOTE — Progress Notes (Signed)
RN administered PRN breathing treatment for shortness of breath and wheezing. Patient continued to experience shortness of breath, RT called to patient bedside for assessment. Patient has newly developed wheezes throughout and fine crackles. 2L [place] SAT at 93%, respirations 36bpm. RN aware and discussing further action plan with Hospitalist.

## 2018-06-16 NOTE — Progress Notes (Signed)
Patient ID: Tony Fowler, male   DOB: 1945/11/14, 73 y.o.   MRN: [id number]  [employer] PROGRESS NOTE  Tony Fowler [id number]:[id number] DOB: 06/16/1945 DOA: 06/13/2018 PCP: Steele Sizer, MD  HPI/Subjective: Patient is reporting watery diarrhea and feeling dizzy he denies any nausea vomiting.  Denies any abdominal pain. Still with some cough and shortness of breath.  Objective: Vitals:   06/16/18 0949 06/16/18 1414  BP: (!) 146/93 (!) 166/97  Pulse: 97 (!) 101  Resp: 18 20  Temp: 98 F (36.7 C) 98 F (36.7 C)  SpO2: 94% 95%    Filed Weights   06/13/18 1520  Weight: 62.1 kg    ROS: Review of Systems  Constitutional: Positive for malaise/fatigue. Negative for chills and fever.  Eyes: Negative for blurred vision.  Respiratory: Positive for cough and shortness of breath.   Cardiovascular: Negative for chest pain.  Gastrointestinal: Positive for diarrhea. Negative for abdominal pain, constipation, nausea and vomiting.  Genitourinary: Negative for dysuria.  Musculoskeletal: Negative for joint pain.  Neurological: Positive for dizziness. Negative for headaches.   Exam: Physical Exam  Constitutional: He is oriented to person, place, and time.  HENT:  Nose: No mucosal edema.  Mouth/Throat: No oropharyngeal exudate or posterior oropharyngeal edema.  Right tympanic membrane cloudy.  Left tympanic membrane normal  Eyes: Pupils are equal, round, and reactive to light. Conjunctivae, EOM and lids are normal.  Neck: No JVD present. Carotid bruit is not present. No edema present. No thyroid mass and no thyromegaly present.  Cardiovascular: S1 normal and S2 normal. Exam reveals no gallop.  No murmur heard. Pulses:      Dorsalis pedis pulses are 2+ on the right side and 2+ on the left side.  Respiratory: No respiratory distress. He has decreased breath sounds in the right lower field and the left lower field. He has no wheezes. He has rhonchi in the right lower field and the left  lower field. He has no rales.  GI: Soft. Bowel sounds are normal. There is no abdominal tenderness.  Large right inguinal hernia which is reducible but came right back out when I let go.  Musculoskeletal:     Left knee: He exhibits deformity. He exhibits no swelling and no effusion. No tenderness found. No medial joint line and no lateral joint line tenderness noted.     Right ankle: He exhibits no swelling.     Left ankle: He exhibits no swelling.  Lymphadenopathy:    He has no cervical adenopathy.  Neurological: He is alert and oriented to person, place, and time. No cranial nerve deficit.  Skin: Skin is warm. No rash noted. Nails show no clubbing.  Psychiatric: He has a normal mood and affect.      Data Reviewed: Basic Metabolic Panel: Recent Labs  Lab 06/13/18 1544 06/13/18 1833 06/13/18 2343 06/15/18 0415  NA 134* 136 137 139  K 4.4 4.3 4.8 4.2  CL 103 106 107 113*  CO2 18* 21* 20* 18*  GLUCOSE 171* 150* 174* 152*  BUN 25* 25* 27* 33*  CREATININE 1.42* 1.42* 1.57* 1.07  CALCIUM 10.0 8.8* 9.4 10.0  MG  --  1.7  --   --    Liver Function Tests: Recent Labs  Lab 06/13/18 1833  AST 26  ALT 18  ALKPHOS 49  BILITOT 0.7  PROT 6.1*  ALBUMIN 2.5*   CBC: Recent Labs  Lab 06/13/18 1544 06/13/18 2343 06/15/18 0415  WBC 19.6* 15.7* 17.8*  HGB 14.1 12.1* 11.2*  HCT 41.1 36.5* 34.1*  MCV 90.1 93.1 94.5  PLT 258 233 269   Cardiac Enzymes: Recent Labs  Lab 06/13/18 1833  TROPONINI 0.03*   BNP (last 3 results) Recent Labs    06/13/18 1546  BNP 204.0*     Recent Results (from the past 240 hour(s))  Blood culture (routine x 2)     Status: None (Preliminary result)   Collection Time: 06/13/18  3:44 PM  Result Value Ref Range Status   Specimen Description BLOOD LEFT FA  Final   Special Requests   Final    BOTTLES DRAWN AEROBIC AND ANAEROBIC Blood Culture results may not be optimal due to an inadequate volume of blood received in culture bottles   Culture    Final    NO GROWTH 3 DAYS Performed at [facility], [address] [place], [place] [address]    Report Status PENDING  Incomplete  Blood culture (routine x 2)     Status: None (Preliminary result)   Collection Time: 06/13/18  4:43 PM  Result Value Ref Range Status   Specimen Description BLOOD RT WRIST  Final   Special Requests   Final    BOTTLES DRAWN AEROBIC AND ANAEROBIC Blood Culture adequate volume   Culture   Final    NO GROWTH 3 DAYS Performed at [facility], [address] [place], [place] [address]    Report Status PENDING  Incomplete  Respiratory Panel by PCR     Status: None   Collection Time: 06/13/18  4:44 PM  Result Value Ref Range Status   Adenovirus NOT DETECTED NOT DETECTED Final   Coronavirus 229E NOT DETECTED NOT DETECTED Final    Comment: (NOTE) The Coronavirus on the Respiratory Panel, DOES NOT test for the novel  Coronavirus (2019 nCoV)    Coronavirus HKU1 NOT DETECTED NOT DETECTED Final   Coronavirus NL63 NOT DETECTED NOT DETECTED Final   Coronavirus OC43 NOT DETECTED NOT DETECTED Final   Metapneumovirus NOT DETECTED NOT DETECTED Final   Rhinovirus / Enterovirus NOT DETECTED NOT DETECTED Final   Influenza A NOT DETECTED NOT DETECTED Final   Influenza B NOT DETECTED NOT DETECTED Final   Parainfluenza Virus 1 NOT DETECTED NOT DETECTED Final   Parainfluenza Virus 2 NOT DETECTED NOT DETECTED Final   Parainfluenza Virus 3 NOT DETECTED NOT DETECTED Final   Parainfluenza Virus 4 NOT DETECTED NOT DETECTED Final   Respiratory Syncytial Virus NOT DETECTED NOT DETECTED Final   Bordetella pertussis NOT DETECTED NOT DETECTED Final   Chlamydophila pneumoniae NOT DETECTED NOT DETECTED Final   Mycoplasma pneumoniae NOT DETECTED NOT DETECTED Final    Comment: Performed at [facility], [address], [place], [place] [address]  C difficile quick scan w PCR reflex     Status: None   Collection Time: 06/16/18 12:53 PM  Result Value Ref  Range Status   C Diff antigen NEGATIVE NEGATIVE Final   C Diff toxin NEGATIVE NEGATIVE Final   C Diff interpretation No C. difficile detected.  Final    Comment: Performed at [facility], [address] [place], [place] [address]     Studies: Mr Laqueta Jean Wo Contrast  Result Date: 06/15/2018 CLINICAL DATA:  Vertigo EXAM: MRI HEAD WITHOUT AND WITH CONTRAST TECHNIQUE: Multiplanar, multiecho pulse sequences of the brain and surrounding structures were obtained without and with intravenous contrast. CONTRAST:  6 mL Gadavist COMPARISON:  Brain MRI 07/08/2016 FINDINGS: BRAIN: There is no acute infarct, acute  hemorrhage, hydrocephalus or extra-axial collection. The midline structures are normal. No midline shift or other mass effect. Multifocal white matter hyperintensity, most commonly due to chronic ischemic microangiopathy. Generalized atrophy without lobar predilection. Susceptibility-sensitive sequences show no chronic microhemorrhage or superficial siderosis. VASCULAR: Major intracranial arterial and venous sinus flow voids are normal. SKULL AND UPPER CERVICAL SPINE: Calvarial bone marrow signal is normal. There is no skull base mass. Visualized upper cervical spine and soft tissues are normal. SINUSES/ORBITS: Moderate right maxillary sinus mucosal thickening. Small bilateral mastoid effusions. The orbits are normal. IMPRESSION: 1. No acute intracranial abnormality. 2. Generalized atrophy and chronic microvascular ischemia. Electronically Signed   By: Deatra Robinson M.D.   On: 06/15/2018 13:42   Dg Chest Port 1 View  Result Date: 06/16/2018 CLINICAL DATA:  Cough EXAM: PORTABLE CHEST 1 VIEW COMPARISON:  06/13/2018, 04/20/2016 FINDINGS: Diffuse interstitial opacity. Partial clearing of lower lobe pneumonia but with significant residual infiltrate. There may be small right effusion. Normal heart size. No pneumothorax. IMPRESSION: Partial clearing of right greater than left lower lobe pneumonia  though with significant residual. Imaging follow-up to resolution is advised. Electronically Signed   By: Jasmine Pang M.D.   On: 06/16/2018 01:13    Scheduled Meds: . ARIPiprazole  5 mg Oral Daily  . aspirin EC  81 mg Oral Daily  . budesonide (PULMICORT) nebulizer solution  0.5 mg Nebulization BID  . carbamazepine  200 mg Oral TID  . enoxaparin (LOVENOX) injection  40 mg Subcutaneous Q24H  . feeding supplement (ENSURE ENLIVE)  237 mL Oral TID BM  . fluticasone  2 spray Each Nare Daily  . gabapentin  300 mg Oral TID  . ipratropium-albuterol  3 mL Nebulization Q6H  . methylPREDNISolone (SOLU-MEDROL) injection  40 mg Intravenous Daily  . multivitamin with minerals  1 tablet Oral Daily  . pantoprazole  40 mg Oral Daily  . tamsulosin  0.4 mg Oral Daily   Continuous Infusions: . cefTRIAXone (ROCEPHIN)  IV 2 g (06/15/18 1710)    Assessment/Plan:  1. Acute watery diarrhea: Check stool for C. difficile toxin and GI panel, enteric precautions not starting any antibiotics yet.  Hydrate with IV fluids 2. Acute hypoxic respiratory failure from clinical sepsis with pneumonia.  Patient on Rocephin and Zithromax.  Lactic acidosis on presentation has improved.  White blood cell count still elevated secondary to steroids 3. COPD exacerbation with bronchospasm.  Continue Solu-Medrol to 40 mg daily and taper to p.o. steroids once clinically better Continue nebulizer treatments. 4. Dizziness.  MRI of the brain negative.  Patient referred to op therapy and balance center for continued dizziness.  Trial of meclizine. 5. Acute kidney injury on chronic kidney disease stage II.  Creatinine improved to 1.07 6. Hypertension blood pressure is elevated titrate blood pressure meds BPH on Flomax 7. Bipolar disorder on numerous psychiatric medications 8. Weakness and dizziness.  Physical therapy evaluation-recommending home health PT and rolling walker with 5 inch wheels 9. Left knee pain.  X-ray revealed  tricompartmental degenerative changes will need to follow-up with orthopedic surgery as outpatient. 10. Large right inguinal hernia.  Can follow-up with general surgery as outpatient for respiratory symptoms need to resolve prior to any procedure.  Code Status:     Code Status Orders  (From admission, onward)         Start     Ordered   06/13/18 1813  Full code  Continuous     06/13/18 1812        Code  Status History    Date Active Date Inactive Code Status Order ID Comments User Context   12/01/2015 2039 12/14/2015 2015 Full Code [id number]  Clapacs, Jackquline Denmark, MD Inpatient     Family Communication: Spoke with 2 daughters at the bedside yesterday Disposition Plan: Evaluate on a day-to-day basis on when to go home  Antibiotics:  Rocephin  Zithromax  Time spent: 33 minutes  Deanna Artis Sierah Lacewell  [employer]

## 2018-06-16 NOTE — TOC Initial Note (Signed)
Transition of Care [facility]) - Initial/Assessment Note    Patient Details  Name: Tony Fowler MRN: [id number] Date of Birth: 1945-07-29  Transition of [facility]) CM/SW Contact:    Chapman Fitch, RN Phone Number: 06/16/2018, 4:51 PM  Clinical Narrative:                  Patient admitted from home with acute diarrhea.  Assessment completed with daughter.  Patient lives at home with daughter. [facility]  PT has assessed patient and recommend home health PT.  Patient and daughter agreeable. CMS Medicare.gov Compare Post Acute Care list reviewed with daughter.  Copy left in the room, she will review this evening.  RNCM to follow up tomorrow to determine which home health agency they would like to use.    Patient has standard walker in home.  Anticipated needs for RW, BSC, and nebulizer.   Patient still currently requiring acute O2   Expected Discharge Plan: Home w Home Health Services Barriers to Discharge: Continued Medical Work up   Patient Goals and CMS Choice Patient states their goals for this hospitalization and ongoing recovery are:: "I want to make sure we get him all of the eqipment he needs before he leaves" CMS Medicare.gov Compare Post Acute Care list provided to:: Patient Represenative (must comment)(Daughter ) Choice offered to / list presented to : Adult Children  Expected Discharge Plan and Services Expected Discharge Plan: Home w Home Health Services Discharge Planning Services: CM Consult Post Acute Care Choice: Home Health, Durable Medical Equipment Living arrangements for the past 2 months: Single Family Home Expected Discharge Date: 06/15/18               DME Arranged: Nebulizer machine, 3-N-1, Walker rolling DME Agency: [employer]      Prior Living Arrangements/Services Living arrangements for the past 2 months: Single Family Home Lives with:: Adult Children Patient language and need for interpreter reviewed:: No Do you feel  safe going back to the place where you live?: Yes      Need for Family Participation in Patient Care: Yes (Comment) Care giver support system in place?: Yes (comment)   Criminal Activity/Legal Involvement Pertinent to Current Situation/Hospitalization: No - Comment as needed  Activities of Daily Living Home Assistive Devices/Equipment: Environmental consultant (specify type), Cane (specify quad or straight), Shower chair without back ADL Screening (condition at time of admission) Patient's cognitive ability adequate to safely complete daily activities?: Yes Is the patient deaf or have difficulty hearing?: No Does the patient have difficulty seeing, even when wearing glasses/contacts?: No Does the patient have difficulty concentrating, remembering, or making decisions?: No Patient able to express need for assistance with ADLs?: Yes Does the patient have difficulty dressing or bathing?: No Independently performs ADLs?: Yes (appropriate for developmental age) Does the patient have difficulty walking or climbing stairs?: Yes Weakness of Legs: Both Weakness of Arms/Hands: None  Permission Sought/Granted                  Emotional Assessment Appearance:: Appears stated age, Developmentally appropriate Attitude/Demeanor/Rapport: Gracious Affect (typically observed): Accepting, Pleasant Orientation: : Oriented to Self, Oriented to Place, Oriented to  Time, Oriented to Situation      Admission diagnosis:  SOB (shortness of breath) [R06.02] Acute respiratory failure with hypoxia (HCC) [J96.01] Community acquired pneumonia, unspecified laterality [J18.9] Sepsis with acute hypoxic respiratory failure without septic shock, due to unspecified organism (HCC) [A41.9, R65.20, J96.01] Patient Active Problem List   Diagnosis Date Noted  .  Malnutrition of moderate degree 06/15/2018  . Sepsis (HCC) 06/13/2018  . Knee pain, left 01/08/2018  . Advanced care planning/counseling discussion 06/26/2017  .  Hyperchloremia 02/04/2017  . Tremor 05/16/2016  . Reflux esophagitis 05/16/2016  . Inguinal hernia 05/16/2016  . Bipolar affective disorder, manic, severe, with psychotic behavior (HCC) 12/01/2015  . Tobacco use disorder 12/01/2015  . BPH with obstruction/lower urinary tract symptoms 02/22/2015  . Benign hypertension 09/07/2014   PCP:  Steele Sizer, MD Pharmacy:   [facility] [address] [place], [place] - [address] [place] [address] Phone: [phone] Fax: [phone]     Social Determinants of Health (SDOH) Interventions    Readmission Risk Interventions 30 Day Unplanned Readmission Risk Score     ED to Hosp-Admission (Current) from [facility]13/2018 [facility]  30 Day Unplanned Readmission Risk Score (%)  20 Filed at 06/16/2018 1600     This score is the patient's risk of an unplanned readmission within 30 days of being discharged (0 -100%). The score is based on dignosis, age, lab data, medications, orders, and past utilization.   Low:  0-14.9   Medium: 15-21.9   High: 22-29.9   Extreme: 30 and above       No flowsheet data found.

## 2018-06-16 NOTE — Progress Notes (Signed)
DUONEB treatment given per MD verbal order for shortness of breath and wheezing.

## 2018-06-16 NOTE — Care Management Important Message (Signed)
Copy of signed Medicare IM left with patient in room. 

## 2018-06-17 ENCOUNTER — Telehealth: Payer: Self-pay

## 2018-06-17 MED ORDER — FLORANEX PO PACK
1.0000 g | PACK | Freq: Three times a day (TID) | ORAL | Status: DC
  Administered 2018-06-17 – 2018-06-18 (×4): 1 g via ORAL
  Filled 2018-06-17 (×9): qty 1

## 2018-06-17 MED ORDER — CEFDINIR 300 MG PO CAPS
300.0000 mg | ORAL_CAPSULE | Freq: Two times a day (BID) | ORAL | Status: DC
  Administered 2018-06-17 – 2018-06-18 (×2): 300 mg via ORAL
  Filled 2018-06-17 (×4): qty 1

## 2018-06-17 NOTE — Progress Notes (Signed)
Physical Therapy Treatment Patient Details Name: Tony Fowler MRN: [id number] DOB: Sep 15, 1945 Today's Date: 06/17/2018    History of Present Illness From MD H&P:  Pt is a 73 y.o. male with a known history of bipolar disorder, coarse tremors and hypertension.  The patient was sent from home to ED due to SOB and dizziness with complaints of fever chills, nausea, decreased appetite, cough with phlegm and worsening shortness of breath.  The patient was found with hypoxia at 85% and put on oxygen by nasal cannula 4 L.  He also had tachycardia, tachypnea, leukocytosis, chest x-ray show bilateral basilar pneumonia. Assessment inlcudes: Acute hypoxic respiratory failure, Clinical sepsis with pneumonia, COPD exacerbation, dizziness, bipolar disorder, and L knee pain.    PT Comments    Pt agreeable to PT for bed exercises only; pt denies pain. Flat affect this session. Performs bed exercises with assist throughout, need for increased verbal and tactile cues as well as repeated instruction to stay on task. Pt fatigues easily. This session overall, demonstrating decline in function/strength. Declines out of bed due to uncontrolled BM. Pt also noted with limited verbiage to be repetitive in nature with a few comments (ie. I have tremors); with no tremors noted during session. Continue PT to progress participation, strength and endurance to improve all functional mobility.    Follow Up Recommendations  Home health PT;Supervision for mobility/OOB(dependent on mobility; may be declining)     Equipment Recommendations  Other (comment)    Recommendations for Other Services       Precautions / Restrictions Precautions Precautions: Fall Restrictions Weight Bearing Restrictions: No    Mobility  Bed Mobility               General bed mobility comments: Not tested; pt refuses due to bout of uncontrolled BM  Transfers                    Ambulation/Gait                 Stairs              Wheelchair Mobility    Modified Rankin (Stroke Patients Only)       Balance                                            Cognition Arousal/Alertness: Awake/alert Behavior During Therapy: Flat affect Overall Cognitive Status: Within Functional Limits for tasks assessed                                 General Comments: Pt follows 1 step instruction inconsistently requiring continued cues to stay on task. Pt repetitive in his limited verbage at time (ie. "I have tremors" repeated in monotone); no tremors noted during session      Exercises General Exercises - Lower Extremity Ankle Circles/Pumps: AROM;Both;15 reps Quad Sets: Strengthening;Both;15 reps Gluteal Sets: Strengthening;Both;15 reps Short Arc Quad: AROM;AAROM;Both;15 reps Heel Slides: AAROM;Both;15 reps Hip ABduction/ADduction: AAROM;Both;15 reps Straight Leg Raises: AAROM;Both;15 reps    General Comments        Pertinent Vitals/Pain Pain Assessment: No/denies pain    Home Living                      Prior Function  PT Goals (current goals can now be found in the care plan section) Progress towards PT goals: Not progressing toward goals - comment    Frequency    Min 2X/week      PT Plan Current plan remains appropriate;Other (comment)(may require change dependent on mobility; may be declining)    Co-evaluation              AM-PAC PT "6 Clicks" Mobility   Outcome Measure  Help needed turning from your back to your side while in a flat bed without using bedrails?: A Little Help needed moving from lying on your back to sitting on the side of a flat bed without using bedrails?: A Little Help needed moving to and from a bed to a chair (including a wheelchair)?: A Lot Help needed standing up from a chair using your arms (e.g., wheelchair or bedside chair)?: A Lot Help needed to walk in hospital room?: A Lot Help needed climbing  3-5 steps with a railing? : A Lot 6 Click Score: 14    End of Session Equipment Utilized During Treatment: Oxygen Activity Tolerance: Patient limited by fatigue;Other (comment)(weakness; flat affect) Patient left: in bed;with call bell/phone within reach;with bed alarm set   PT Visit Diagnosis: Unsteadiness on feet (R26.81);Difficulty in walking, not elsewhere classified (R26.2);Muscle weakness (generalized) (M62.81)     Time: 1062-6948 PT Time Calculation (min) (ACUTE ONLY): 23 min  Charges:  $Therapeutic Exercise: 23-37 mins                      Scot Dock, PTA 06/17/2018, 11:10 AM

## 2018-06-17 NOTE — Progress Notes (Signed)
Pt. is very weak and needs 24 hour assistance with ADL's. He is showing difficulty with using walker but easy to get re-oriented. I don't feel he is safe at home by himself especially with mobility. I spoke to the daughter,and she  said , she can be there at night and she will arrange someone that can be there for him during the day.

## 2018-06-17 NOTE — Progress Notes (Signed)
SATURATION QUALIFICATIONS: (This note is used to comply with regulatory documentation for home oxygen)  Patient Saturations on Room Air at Rest =  92 %  Patient Saturations on Room Air while Ambulating = 86%  Patient Saturations on 5L Liters of oxygen while Ambulating = 93%  Please briefly explain why patient needs home oxygen:

## 2018-06-17 NOTE — Telephone Encounter (Signed)
Copied from CRM [id number]. Topic: General - Other >> Jun 17, 2018  3:08 PM Leafy Ro wrote: Reason for CRM: Sonda Primes pt daughter would like dr Dossie Arbour to return her call concerning they are trying to discharge her dad from [facility] tomorrow . Pt dx PNA and daughter has some concerns >> Jun 17, 2018  4:42 PM Steele Sizer, MD wrote: call

## 2018-06-17 NOTE — Telephone Encounter (Addendum)
Phone call. Discussed with patient's daughter care and in the hospital with pneumonia patient's not ready for self-care at this point discussed possibility of readmission if he is sent home tomorrow discuss skilled nursing and TB testing.

## 2018-06-17 NOTE — Progress Notes (Signed)
Patient ID: Tony Fowler, male   DOB: 1945/12/08, 73 y.o.   MRN: [id number]  [facility] PROGRESS NOTE  Tony Fowler [id number]:[id number] DOB: 05/09/1945 DOA: 06/13/2018 PCP: Steele Sizer, MD  HPI/Subjective: Patient is reporting 1 bowel movement.  Extremely weak.  Daughter can arrange caregivers tomorrow and nobody is there to watch him today and patient feels unsafe to go home and stay by himself, he is at high risk for falls  Objective: Vitals:   06/17/18 1246 06/17/18 1358  BP: (!) 160/88   Pulse: 91   Resp: 16   Temp: 98.6 F (37 C)   SpO2: 93% 96%    Filed Weights   06/13/18 1520  Weight: 62.1 kg    ROS: Review of Systems  Constitutional: Positive for malaise/fatigue. Negative for chills and fever.  Eyes: Negative for blurred vision.  Respiratory: Positive for cough. Negative for shortness of breath.   Cardiovascular: Negative for chest pain.  Gastrointestinal: Negative for abdominal pain, constipation, diarrhea, nausea and vomiting.  Genitourinary: Negative for dysuria.  Musculoskeletal: Negative for joint pain.  Neurological: Negative for dizziness and headaches.   Exam: Physical Exam  Constitutional: He is oriented to person, place, and time.  HENT:  Nose: No mucosal edema.  Mouth/Throat: No oropharyngeal exudate or posterior oropharyngeal edema.  Right tympanic membrane cloudy.  Left tympanic membrane normal  Eyes: Pupils are equal, round, and reactive to light. Conjunctivae, EOM and lids are normal.  Neck: No JVD present. Carotid bruit is not present. No edema present. No thyroid mass and no thyromegaly present.  Cardiovascular: S1 normal and S2 normal. Exam reveals no gallop.  No murmur heard. Pulses:      Dorsalis pedis pulses are 2+ on the right side and 2+ on the left side.  Respiratory: No respiratory distress. He has decreased breath sounds in the right lower field and the left lower field. He has no wheezes. He has rhonchi. He has no rales.  GI:  Soft. Bowel sounds are normal. There is no abdominal tenderness.  Large right inguinal hernia which is reducible but came right back out when I let go.  Musculoskeletal:     Left knee: He exhibits deformity. He exhibits no swelling and no effusion. No tenderness found. No medial joint line and no lateral joint line tenderness noted.     Right ankle: He exhibits no swelling.     Left ankle: He exhibits no swelling.  Lymphadenopathy:    He has no cervical adenopathy.  Neurological: He is alert and oriented to person, place, and time. No cranial nerve deficit.  Skin: Skin is warm. No rash noted. Nails show no clubbing.  Psychiatric: He has a normal mood and affect.      Data Reviewed: Basic Metabolic Panel: Recent Labs  Lab 06/13/18 1544 06/13/18 1833 06/13/18 2343 06/15/18 0415  NA 134* 136 137 139  K 4.4 4.3 4.8 4.2  CL 103 106 107 113*  CO2 18* 21* 20* 18*  GLUCOSE 171* 150* 174* 152*  BUN 25* 25* 27* 33*  CREATININE 1.42* 1.42* 1.57* 1.07  CALCIUM 10.0 8.8* 9.4 10.0  MG  --  1.7  --   --    Liver Function Tests: Recent Labs  Lab 06/13/18 1833  AST 26  ALT 18  ALKPHOS 49  BILITOT 0.7  PROT 6.1*  ALBUMIN 2.5*   CBC: Recent Labs  Lab 06/13/18 1544 06/13/18 2343 06/15/18 0415  WBC 19.6* 15.7* 17.8*  HGB 14.1 12.1* 11.2*  HCT  41.1 36.5* 34.1*  MCV 90.1 93.1 94.5  PLT 258 233 269   Cardiac Enzymes: Recent Labs  Lab 06/13/18 1833  TROPONINI 0.03*   BNP (last 3 results) Recent Labs    06/13/18 1546  BNP 204.0*     Recent Results (from the past 240 hour(s))  Blood culture (routine x 2)     Status: None (Preliminary result)   Collection Time: 06/13/18  3:44 PM  Result Value Ref Range Status   Specimen Description BLOOD LEFT FA  Final   Special Requests   Final    BOTTLES DRAWN AEROBIC AND ANAEROBIC Blood Culture results may not be optimal due to an inadequate volume of blood received in culture bottles   Culture   Final    NO GROWTH 4  DAYS Performed at [facility], [address] [place], [place] [address]    Report Status PENDING  Incomplete  Blood culture (routine x 2)     Status: None (Preliminary result)   Collection Time: 06/13/18  4:43 PM  Result Value Ref Range Status   Specimen Description BLOOD RT WRIST  Final   Special Requests   Final    BOTTLES DRAWN AEROBIC AND ANAEROBIC Blood Culture adequate volume   Culture   Final    NO GROWTH 4 DAYS Performed at [facility], [address] [place], [place] [address]    Report Status PENDING  Incomplete  Respiratory Panel by PCR     Status: None   Collection Time: 06/13/18  4:44 PM  Result Value Ref Range Status   Adenovirus NOT DETECTED NOT DETECTED Final   Coronavirus 229E NOT DETECTED NOT DETECTED Final    Comment: (NOTE) The Coronavirus on the Respiratory Panel, DOES NOT test for the novel  Coronavirus (2019 nCoV)    Coronavirus HKU1 NOT DETECTED NOT DETECTED Final   Coronavirus NL63 NOT DETECTED NOT DETECTED Final   Coronavirus OC43 NOT DETECTED NOT DETECTED Final   Metapneumovirus NOT DETECTED NOT DETECTED Final   Rhinovirus / Enterovirus NOT DETECTED NOT DETECTED Final   Influenza A NOT DETECTED NOT DETECTED Final   Influenza B NOT DETECTED NOT DETECTED Final   Parainfluenza Virus 1 NOT DETECTED NOT DETECTED Final   Parainfluenza Virus 2 NOT DETECTED NOT DETECTED Final   Parainfluenza Virus 3 NOT DETECTED NOT DETECTED Final   Parainfluenza Virus 4 NOT DETECTED NOT DETECTED Final   Respiratory Syncytial Virus NOT DETECTED NOT DETECTED Final   Bordetella pertussis NOT DETECTED NOT DETECTED Final   Chlamydophila pneumoniae NOT DETECTED NOT DETECTED Final   Mycoplasma pneumoniae NOT DETECTED NOT DETECTED Final    Comment: Performed at [facility], [address], [place], [place] [address]  C difficile quick scan w PCR reflex     Status: None   Collection Time: 06/16/18 12:53 PM  Result Value Ref Range Status   C Diff  antigen NEGATIVE NEGATIVE Final   C Diff toxin NEGATIVE NEGATIVE Final   C Diff interpretation No C. difficile detected.  Final    Comment: Performed at [facility], [address] [place], [place] [address]  Gastrointestinal Panel by PCR , Stool     Status: None   Collection Time: 06/16/18 12:53 PM  Result Value Ref Range Status   Campylobacter species NOT DETECTED NOT DETECTED Final   Plesimonas shigelloides NOT DETECTED NOT DETECTED Final   Salmonella species NOT DETECTED NOT DETECTED Final   Yersinia enterocolitica NOT DETECTED NOT DETECTED Final  Vibrio species NOT DETECTED NOT DETECTED Final   Vibrio cholerae NOT DETECTED NOT DETECTED Final   Enteroaggregative E coli (EAEC) NOT DETECTED NOT DETECTED Final   Enteropathogenic E coli (EPEC) NOT DETECTED NOT DETECTED Final   Enterotoxigenic E coli (ETEC) NOT DETECTED NOT DETECTED Final   Shiga like toxin producing E coli (STEC) NOT DETECTED NOT DETECTED Final   Shigella/Enteroinvasive E coli (EIEC) NOT DETECTED NOT DETECTED Final   Cryptosporidium NOT DETECTED NOT DETECTED Final   Cyclospora cayetanensis NOT DETECTED NOT DETECTED Final   Entamoeba histolytica NOT DETECTED NOT DETECTED Final   Giardia lamblia NOT DETECTED NOT DETECTED Final   Adenovirus F40/41 NOT DETECTED NOT DETECTED Final   Astrovirus NOT DETECTED NOT DETECTED Final   Norovirus GI/GII NOT DETECTED NOT DETECTED Final   Rotavirus A NOT DETECTED NOT DETECTED Final   Sapovirus (I, II, IV, and V) NOT DETECTED NOT DETECTED Final    Comment: Performed at [facility], [address], [place], [place] [address]     Studies: Dg Chest Port 1 View  Result Date: 06/16/2018 CLINICAL DATA:  Cough EXAM: PORTABLE CHEST 1 VIEW COMPARISON:  06/13/2018, 04/20/2016 FINDINGS: Diffuse interstitial opacity. Partial clearing of lower lobe pneumonia but with significant residual infiltrate. There may be small right effusion. Normal heart size. No pneumothorax.  IMPRESSION: Partial clearing of right greater than left lower lobe pneumonia though with significant residual. Imaging follow-up to resolution is advised. Electronically Signed   By: Jasmine Pang M.D.   On: 06/16/2018 01:13    Scheduled Meds: . ARIPiprazole  5 mg Oral Daily  . aspirin EC  81 mg Oral Daily  . budesonide (PULMICORT) nebulizer solution  0.5 mg Nebulization BID  . carbamazepine  200 mg Oral TID  . enoxaparin (LOVENOX) injection  40 mg Subcutaneous Q24H  . feeding supplement (ENSURE ENLIVE)  237 mL Oral TID BM  . fluticasone  2 spray Each Nare Daily  . gabapentin  300 mg Oral TID  . ipratropium-albuterol  3 mL Nebulization Q6H  . lactobacillus  1 g Oral TID WC  . lisinopril  10 mg Oral Daily  . methylPREDNISolone (SOLU-MEDROL) injection  40 mg Intravenous Daily  . multivitamin with minerals  1 tablet Oral Daily  . pantoprazole  40 mg Oral Daily  . tamsulosin  0.4 mg Oral Daily   Continuous Infusions: . cefTRIAXone (ROCEPHIN)  IV 2 g (06/16/18 1734)    Assessment/Plan:  1. Acute watery diarrhea: Improved stool for C. difficile toxin is negative check stool for C. difficile toxin and GI panel negative.  Hydrated with IV fluids 2. Acute hypoxic respiratory failure from clinical sepsis with pneumonia.  Patient on Rocephin and Zithromax.  Lactic acidosis on presentation has improved.  White blood cell count still elevated secondary to steroids 3. COPD exacerbation with bronchospasm.  Continue Solu-Medrol to 40 mg daily and taper to p.o. steroids once clinically better Continue nebulizer treatments. 4. Dizziness.  MRI of the brain negative.  Patient referred to op therapy and balance center for continued dizziness.  Trial of meclizine. 5. Acute kidney injury on chronic kidney disease stage II.  Creatinine improved to 1.07 6. Hypertension blood pressure is elevated titrate blood pressure meds BPH on Flomax 7. Bipolar disorder on numerous psychiatric medications 8. Weakness and  dizziness.  Physical therapy evaluation-recommending home health PT and rolling walker with 5 inch wheels 9. Left knee pain.  X-ray revealed tricompartmental degenerative changes will need to follow-up with orthopedic surgery as outpatient.  10. Large right inguinal hernia.  Can follow-up with general surgery as outpatient for respiratory symptoms need to resolve prior to any procedure. 11. Disposition home tomorrow.  Daughter will arrange caregiver during daytime and she will watch him during nights as patient is at high risk for falls  Code Status:     Code Status Orders  (From admission, onward)         Start     Ordered   06/13/18 1813  Full code  Continuous     06/13/18 1812        Code Status History    Date Active Date Inactive Code Status Order ID Comments User Context   12/01/2015 2039 12/14/2015 2015 Full Code [id number]  Clapacs, Jackquline Denmark, MD Inpatient     Family Communication: Spoke with daughter Fleet Contras over phone Anticipate discharge in a.m.  Antibiotics:  Omnicef, Zithromax  Time spent: 29 minutes  Deanna Artis Guido Comp  [employer]

## 2018-06-18 LAB — CULTURE, BLOOD (ROUTINE X 2)
Culture: NO GROWTH
Culture: NO GROWTH
SPECIAL REQUESTS: ADEQUATE

## 2018-06-18 LAB — CBC
HCT: 33.4 % — ABNORMAL LOW (ref 39.0–52.0)
Hemoglobin: 11.3 g/dL — ABNORMAL LOW (ref 13.0–17.0)
MCH: 31.6 pg (ref 26.0–34.0)
MCHC: 33.8 g/dL (ref 30.0–36.0)
MCV: 93.3 fL (ref 80.0–100.0)
NRBC: 0 % (ref 0.0–0.2)
Platelets: 333 10*3/uL (ref 150–400)
RBC: 3.58 MIL/uL — AB (ref 4.22–5.81)
RDW: 14 % (ref 11.5–15.5)
WBC: 15 10*3/uL — ABNORMAL HIGH (ref 4.0–10.5)

## 2018-06-18 MED ORDER — PREDNISONE 10 MG PO TABS: 10.0000 mg | ORAL_TABLET | Freq: Every day | ORAL | 0 refills | Status: DC

## 2018-06-18 MED ORDER — ALBUTEROL SULFATE HFA 108 (90 BASE) MCG/ACT IN AERS: 2.0000 | INHALATION_SPRAY | Freq: Four times a day (QID) | RESPIRATORY_TRACT | 2 refills | Status: DC | PRN

## 2018-06-18 MED ORDER — ENSURE ENLIVE PO LIQD: 237.0000 mL | Freq: Three times a day (TID) | ORAL | 12 refills | Status: DC

## 2018-06-18 MED ORDER — IPRATROPIUM-ALBUTEROL 0.5-2.5 (3) MG/3ML IN SOLN: 3.0000 mL | Freq: Four times a day (QID) | RESPIRATORY_TRACT | 0 refills | Status: DC | PRN

## 2018-06-18 MED ORDER — TUBERCULIN PPD 5 UNIT/0.1ML ID SOLN
5.0000 [IU] | Freq: Once | INTRADERMAL | Status: DC
  Administered 2018-06-18: 5 [IU] via INTRADERMAL
  Filled 2018-06-18: qty 0.1

## 2018-06-18 MED ORDER — CEFDINIR 300 MG PO CAPS: 300.0000 mg | ORAL_CAPSULE | Freq: Two times a day (BID) | ORAL | 0 refills | Status: AC

## 2018-06-18 NOTE — Discharge Summary (Addendum)
[facility] - [place] [facility]   PATIENT NAME: Tony Fowler    MR#:  [id number]  DATE OF BIRTH:  May 21, 1945  DATE OF ADMISSION:  06/13/2018 ADMITTING PHYSICIAN: Shaune Pollack, MD  DATE OF DISCHARGE: 06/18/2018  PRIMARY CARE PHYSICIAN: Steele Sizer, MD   ADMISSION DIAGNOSIS:  SOB (shortness of breath) [R06.02] Acute respiratory failure with hypoxia (HCC) [J96.01] Community acquired pneumonia, unspecified laterality [J18.9] Sepsis with acute hypoxic respiratory failure without septic shock, due to unspecified organism (HCC) [A41.9, R65.20, J96.01]  DISCHARGE DIAGNOSIS:  Active Problems:   Sepsis (HCC)   Malnutrition of moderate degree Acute COPD exacerbation Pneumonia Sepsis secondary to pneumonia Acute on chronic kidney disease stage II Dizziness Inguinal hernia  SECONDARY DIAGNOSIS:   Past Medical History:  Diagnosis Date  . Bipolar 1 disorder (HCC)   . Coarse tremors   . Hypertension      ADMITTING HISTORY Tony Fowler  is a 73 y.o. male with a known history of bipolar disorder, coarse tremors and hypertension.  The patient is sent from home to ED due to above chief complaints.  The patient also complains of fever chills, nausea, decreased appetite, cough with phlegm and worsening shortness of breath.  The patient's grandchildren had some cold recently.  The patient is found hypoxia at 85% and put on oxygen by nasal cannula 4 L.  He also has tachycardia, tachypnea, leukocytosis, chest x-ray show bilateral basilar pneumonia.  He is treated with antibiotics, IV fluid in the ED.  ED physician request admission.  HOSPITAL COURSE:  Patient was admitted to medical floor.  Put on oxygen via nasal cannula.  Sepsis protocol was set up.  Was started on IV Rocephin and Zithromax antibiotics.  Patient received IV Solu-Medrol and nebulization therapy for COPD exacerbation.  Blood cultures did not reveal any growth.  Flu test was negative.  Respiratory panel was  also negative.  Patient shortness of breath improved.  He was weaned to oxygen via nasal cannula at 2 L.  Patient will be discharged home with home health services.  PPD has been placed and will be followed up by primary care physician.  Patient will be discharged on oral Omnicef and tapering dose of steroids.  CONSULTS OBTAINED:    DRUG ALLERGIES:   Allergies  Allergen Reactions  . Penicillins Rash    Childhood allergy, more than 30 years ago. No hospital required.    DISCHARGE MEDICATIONS:   Allergies as of 06/18/2018      Reactions   Penicillins Rash   Childhood allergy, more than 30 years ago. No hospital required.      Medication List    STOP taking these medications   divalproex 500 MG DR tablet Commonly known as:  DEPAKOTE     TAKE these medications   albuterol 108 (90 Base) MCG/ACT inhaler Commonly known as:  PROVENTIL HFA;VENTOLIN HFA Inhale 2 puffs into the lungs every 6 (six) hours as needed for wheezing or shortness of breath.   ARIPiprazole 5 MG tablet Commonly known as:  ABILIFY Take by mouth.   aspirin EC 81 MG tablet Take 81 mg by mouth daily.   carbamazepine 200 MG tablet Commonly known as:  TEGRETOL Take 200 mg by mouth 3 (three) times daily.   cefdinir 300 MG capsule Commonly known as:  OMNICEF Take 1 capsule (300 mg total) by mouth every 12 (twelve) hours for 5 days.   feeding supplement (ENSURE ENLIVE) Liqd Take 237 mLs by mouth 3 (three) times daily  between meals.   fluticasone 50 MCG/ACT nasal spray Commonly known as:  FLONASE Place 2 sprays into both nostrils daily.   gabapentin 300 MG capsule Commonly known as:  NEURONTIN Take 600 mg by mouth 3 (three) times daily.   ipratropium-albuterol 0.5-2.5 (3) MG/3ML Soln Commonly known as:  DUONEB Take 3 mLs by nebulization every 6 (six) hours as needed for up to 30 days.   lisinopril 10 MG tablet Commonly known as:  PRINIVIL,ZESTRIL Take 1 tablet (10 mg total) by mouth daily.    multivitamin tablet Take 1 tablet by mouth daily.   omeprazole 40 MG capsule Commonly known as:  PRILOSEC Take 1 capsule (40 mg total) by mouth daily.   predniSONE 10 MG tablet Commonly known as:  DELTASONE Take 1 tablet (10 mg total) by mouth daily. Label  & dispense according to the schedule below.  6 tablets day one, then 5 table day 2, then 4 tablets day 3, then 3 tablets day 4, 2 tablets day 5, then 1 tablet day 6, then stop   tamsulosin 0.4 MG Caps capsule Commonly known as:  FLOMAX Take 1 capsule (0.4 mg total) by mouth daily.            Durable Medical Equipment  (From admission, onward)         Start     Ordered   06/17/18 1231  For home use only DME oxygen  Once    Question Answer Comment  Mode or (Route) Nasal cannula   Liters per Minute 5   Frequency Continuous (stationary and portable oxygen unit needed)   Oxygen conserving device Yes   Oxygen delivery system Gas      06/17/18 1231   06/17/18 0844  For home use only DME Bedside commode  Once    Question:  Patient needs a bedside commode to treat with the following condition  Answer:  Weakness   06/17/18 0843   06/17/18 0844  For home use only DME Nebulizer machine  Once    Question:  Patient needs a nebulizer to treat with the following condition  Answer:  SOB (shortness of breath)   06/17/18 0844   06/16/18 0852  For home use only DME Walker rolling  Once    Comments:  RW with 5 " wheels  Question:  Patient needs a walker to treat with the following condition  Answer:  Weakness   06/16/18 0851          Today  Patient seen today No shortness of breath Comfortable on oxygen via nasal cannula at 2 L Hemodynamically stable Will be discharged home with home health services  VITAL SIGNS:  Blood pressure (!) 155/86, pulse 91, temperature 97.9 F (36.6 C), temperature source Oral, resp. rate 18, height  (1.676 m), weight 62.1 kg, SpO2 93 %.  I/O:    Intake/Output Summary (Last 24 hours) at  06/18/2018 1357 Last data filed at 06/18/2018 0900 Gross per 24 hour  Intake 120 ml  Output 1300 ml  Net -1180 ml    PHYSICAL EXAMINATION:  Physical Exam  GENERAL:  73 y.o.-year-old patient lying in the bed with no acute distress.  LUNGS: Normal breath sounds bilaterally, no wheezing, rales,rhonchi or crepitation. No use of accessory muscles of respiration.  CARDIOVASCULAR: S1, S2 normal. No murmurs, rubs, or gallops.  ABDOMEN: Soft, non-tender, non-distended. Bowel sounds present. No organomegaly or mass.  NEUROLOGIC: Moves all 4 extremities. PSYCHIATRIC: The patient is alert and oriented x 3.  SKIN:  No obvious rash, lesion, or ulcer.   DATA REVIEW:   CBC Recent Labs  Lab 06/18/18 0545  WBC 15.0*  HGB 11.3*  HCT 33.4*  PLT 333    Chemistries  Recent Labs  Lab 06/13/18 1833  06/15/18 0415  NA 136   < > 139  K 4.3   < > 4.2  CL 106   < > 113*  CO2 21*   < > 18*  GLUCOSE 150*   < > 152*  BUN 25*   < > 33*  CREATININE 1.42*   < > 1.07  CALCIUM 8.8*   < > 10.0  MG 1.7  --   --   AST 26  --   --   ALT 18  --   --   ALKPHOS 49  --   --   BILITOT 0.7  --   --    < > = values in this interval not displayed.    Cardiac Enzymes Recent Labs  Lab 06/13/18 1833  TROPONINI 0.03*    Microbiology Results  Results for orders placed or performed during the hospital encounter of 06/13/18  Blood culture (routine x 2)     Status: None   Collection Time: 06/13/18  3:44 PM  Result Value Ref Range Status   Specimen Description BLOOD LEFT FA  Final   Special Requests   Final    BOTTLES DRAWN AEROBIC AND ANAEROBIC Blood Culture results may not be optimal due to an inadequate volume of blood received in culture bottles   Culture   Final    NO GROWTH 5 DAYS Performed at [facility], [address] [place], [place] [address]    Report Status 06/18/2018 FINAL  Final  Blood culture (routine x 2)     Status: None   Collection Time: 06/13/18  4:43 PM  Result Value  Ref Range Status   Specimen Description BLOOD RT WRIST  Final   Special Requests   Final    BOTTLES DRAWN AEROBIC AND ANAEROBIC Blood Culture adequate volume   Culture   Final    NO GROWTH 5 DAYS Performed at [facility], [address] [place], [place] [address]    Report Status 06/18/2018 FINAL  Final  Respiratory Panel by PCR     Status: None   Collection Time: 06/13/18  4:44 PM  Result Value Ref Range Status   Adenovirus NOT DETECTED NOT DETECTED Final   Coronavirus 229E NOT DETECTED NOT DETECTED Final    Comment: (NOTE) The Coronavirus on the Respiratory Panel, DOES NOT test for the novel  Coronavirus (2019 nCoV)    Coronavirus HKU1 NOT DETECTED NOT DETECTED Final   Coronavirus NL63 NOT DETECTED NOT DETECTED Final   Coronavirus OC43 NOT DETECTED NOT DETECTED Final   Metapneumovirus NOT DETECTED NOT DETECTED Final   Rhinovirus / Enterovirus NOT DETECTED NOT DETECTED Final   Influenza A NOT DETECTED NOT DETECTED Final   Influenza B NOT DETECTED NOT DETECTED Final   Parainfluenza Virus 1 NOT DETECTED NOT DETECTED Final   Parainfluenza Virus 2 NOT DETECTED NOT DETECTED Final   Parainfluenza Virus 3 NOT DETECTED NOT DETECTED Final   Parainfluenza Virus 4 NOT DETECTED NOT DETECTED Final   Respiratory Syncytial Virus NOT DETECTED NOT DETECTED Final   Bordetella pertussis NOT DETECTED NOT DETECTED Final   Chlamydophila pneumoniae NOT DETECTED NOT DETECTED Final   Mycoplasma pneumoniae NOT DETECTED NOT DETECTED Final    Comment: Performed at [facility], [address], [place], [place] [address]  C difficile quick scan w PCR reflex     Status: None   Collection Time: 06/16/18 12:53 PM  Result Value Ref Range Status   C Diff antigen NEGATIVE NEGATIVE Final   C Diff toxin NEGATIVE NEGATIVE Final   C Diff interpretation No C. difficile detected.  Final    Comment: Performed at [facility], [address] [place], [place] [address]  Gastrointestinal  Panel by PCR , Stool     Status: None   Collection Time: 06/16/18 12:53 PM  Result Value Ref Range Status   Campylobacter species NOT DETECTED NOT DETECTED Final   Plesimonas shigelloides NOT DETECTED NOT DETECTED Final   Salmonella species NOT DETECTED NOT DETECTED Final   Yersinia enterocolitica NOT DETECTED NOT DETECTED Final   Vibrio species NOT DETECTED NOT DETECTED Final   Vibrio cholerae NOT DETECTED NOT DETECTED Final   Enteroaggregative E coli (EAEC) NOT DETECTED NOT DETECTED Final   Enteropathogenic E coli (EPEC) NOT DETECTED NOT DETECTED Final   Enterotoxigenic E coli (ETEC) NOT DETECTED NOT DETECTED Final   Shiga like toxin producing E coli (STEC) NOT DETECTED NOT DETECTED Final   Shigella/Enteroinvasive E coli (EIEC) NOT DETECTED NOT DETECTED Final   Cryptosporidium NOT DETECTED NOT DETECTED Final   Cyclospora cayetanensis NOT DETECTED NOT DETECTED Final   Entamoeba histolytica NOT DETECTED NOT DETECTED Final   Giardia lamblia NOT DETECTED NOT DETECTED Final   Adenovirus F40/41 NOT DETECTED NOT DETECTED Final   Astrovirus NOT DETECTED NOT DETECTED Final   Norovirus GI/GII NOT DETECTED NOT DETECTED Final   Rotavirus A NOT DETECTED NOT DETECTED Final   Sapovirus (I, II, IV, and V) NOT DETECTED NOT DETECTED Final    Comment: Performed at [facility], [address], [place], [place] [address]    RADIOLOGY:  No results found.  Follow up with PCP in 1 week.  Management plans discussed with the patient, family and they are in agreement.  CODE STATUS: Full code    Code Status Orders  (From admission, onward)         Start     Ordered   06/13/18 1813  Full code  Continuous     06/13/18 1812        Code Status History    Date Active Date Inactive Code Status Order ID Comments User Context   12/01/2015 2039 12/14/2015 2015 Full Code [id number]  Clapacs, Jackquline Denmark, MD Inpatient      TOTAL TIME TAKING CARE OF THIS PATIENT ON DAY OF DISCHARGE: more than 35  minutes.   Ihor Austin M.D on 06/18/2018 at 1:57 PM  Between 7am to 6pm - Pager - [phone]  After 6pm go to [email or web address] - password [email or web address]  [facility]  Office  [phone]  CC: Primary care physician; Steele Sizer, MD  Note: This dictation was prepared with Dragon dictation along with smaller phrase technology. Any transcriptional errors that result from this process are unintentional.

## 2018-06-18 NOTE — Progress Notes (Signed)
Pt discharged per MD order. IV removed. Discharge instructions reviewed with pt and his daughters. Pt had oxygen tank and walker to take home in the room with him. Daughter report medications had been picked up and that everything had been delivered to their home. All questions answered to pt and family satisfaction. Pt taken to car in wheelchair by staff.

## 2018-06-18 NOTE — TOC Transition Note (Signed)
Transition of Care [facility]) - CM/SW Discharge Note   Patient Details  Name: Ether Provencio MRN: [id number] Date of Birth: 1945/05/10  Transition of Care [facility]) CM/SW Contact:  Chapman Fitch, RN Phone Number: 06/18/2018, 2:11 PM   Clinical Narrative:    Patient to discharge home today.  Rolling walker and portable O2 has been delivered to room.  BSC, nebulizer and home concentrator to be delivered to home today by [place].    Daughter agreeable to home health services.  CMS Medicare.gov Compare Post Acute Care list reviewed with daughter. [facility] selected.  Cory with [facility] notified of referral.     Final next level of care: Home w Home Health Services Barriers to Discharge: Barriers Resolved   Patient Goals and CMS Choice Patient states their goals for this hospitalization and ongoing recovery are:: "I want to make sure we get him all of the eqipment he needs before he leaves" CMS Medicare.gov Compare Post Acute Care list provided to:: Patient Represenative (must comment)(Daughter ) Choice offered to / list presented to : Adult Children  Discharge Placement                       Discharge Plan and Services Discharge Planning Services: CM Consult Post Acute Care Choice: Home Health, Durable Medical Equipment          DME Arranged: 3-N-1, Oxygen, Nebulizer/meds, Walker rolling DME Agency: [employer] HH Arranged: RN, PT, Nurse's Aide HH Agency: [facility]   Social Determinants of Health (SDOH) Interventions     Readmission Risk Interventions No flowsheet data found.

## 2018-06-19 ENCOUNTER — Telehealth: Payer: Self-pay

## 2018-06-19 NOTE — Telephone Encounter (Signed)
HFU scheduled for 07/01/18 @ 10 AM.

## 2018-06-19 NOTE — Telephone Encounter (Signed)
Transition Care Management Follow-up Telephone Call  Date of discharge and from where: [facility]: [name] on 06/18/18  How have you been since you were released from the hospital? Doing fair, feeling better than previously. Stil coughing and SOB at times. Has had some nausea. Currently on 2L of oxygen. Declines pain, fever, vomiting or sputum.   Any questions or concerns? No   Items Reviewed:  Did the pt receive and understand the discharge instructions provided? Yes   Medications obtained and verified? Pt declined reviewing at this time.  Any new allergies since your discharge? No   Dietary orders reviewed? N/A  Do you have support at home? Yes   Other (ie: DME, Home Health, etc) Oxygen  Functional Questionnaire: (I = Independent and D = Dependent)  Bathing/Dressing- Needs assistance.   Meal Prep- Needs assistance.  Eating- I  Maintaining continence- D, incontinent.  Transferring/Ambulation- Uses a walker.  Managing Meds- Needs assistance currently.   Follow up appointments reviewed:    PCP Hospital f/u appt confirmed? Yes  Scheduled to see Dr Dossie Arbour on 07/01/18 @ 10:00 AM.  Specialist Hospital f/u appt confirmed? N/A  Are transportation arrangements needed? No   If their condition worsens, is the pt aware to call  their PCP or go to the ED? Yes  Was the patient provided with contact information for the PCP's office or ED? Yes  Was the pt encouraged to call back with questions or concerns? Yes

## 2018-06-20 DIAGNOSIS — Z7982 Long term (current) use of aspirin: Secondary | ICD-10-CM | POA: Diagnosis not present

## 2018-06-20 DIAGNOSIS — Z7951 Long term (current) use of inhaled steroids: Secondary | ICD-10-CM | POA: Diagnosis not present

## 2018-06-20 DIAGNOSIS — G252 Other specified forms of tremor: Secondary | ICD-10-CM | POA: Diagnosis not present

## 2018-06-20 DIAGNOSIS — E440 Moderate protein-calorie malnutrition: Secondary | ICD-10-CM | POA: Diagnosis not present

## 2018-06-20 DIAGNOSIS — R42 Dizziness and giddiness: Secondary | ICD-10-CM | POA: Diagnosis not present

## 2018-06-20 DIAGNOSIS — K4090 Unilateral inguinal hernia, without obstruction or gangrene, not specified as recurrent: Secondary | ICD-10-CM | POA: Diagnosis not present

## 2018-06-20 DIAGNOSIS — J9601 Acute respiratory failure with hypoxia: Secondary | ICD-10-CM | POA: Diagnosis not present

## 2018-06-20 DIAGNOSIS — I129 Hypertensive chronic kidney disease with stage 1 through stage 4 chronic kidney disease, or unspecified chronic kidney disease: Secondary | ICD-10-CM | POA: Diagnosis not present

## 2018-06-20 DIAGNOSIS — N179 Acute kidney failure, unspecified: Secondary | ICD-10-CM | POA: Diagnosis not present

## 2018-06-20 DIAGNOSIS — J189 Pneumonia, unspecified organism: Secondary | ICD-10-CM | POA: Diagnosis not present

## 2018-06-20 DIAGNOSIS — M25562 Pain in left knee: Secondary | ICD-10-CM | POA: Diagnosis not present

## 2018-06-20 DIAGNOSIS — Z9981 Dependence on supplemental oxygen: Secondary | ICD-10-CM | POA: Diagnosis not present

## 2018-06-20 DIAGNOSIS — F319 Bipolar disorder, unspecified: Secondary | ICD-10-CM | POA: Diagnosis not present

## 2018-06-20 DIAGNOSIS — N182 Chronic kidney disease, stage 2 (mild): Secondary | ICD-10-CM | POA: Diagnosis not present

## 2018-06-20 DIAGNOSIS — A419 Sepsis, unspecified organism: Secondary | ICD-10-CM | POA: Diagnosis not present

## 2018-06-20 DIAGNOSIS — J440 Chronic obstructive pulmonary disease with acute lower respiratory infection: Secondary | ICD-10-CM | POA: Diagnosis not present

## 2018-06-20 DIAGNOSIS — J441 Chronic obstructive pulmonary disease with (acute) exacerbation: Secondary | ICD-10-CM | POA: Diagnosis not present

## 2018-06-22 ENCOUNTER — Telehealth: Payer: Self-pay

## 2018-06-22 NOTE — Telephone Encounter (Signed)
Relayed information to [place].

## 2018-06-22 NOTE — Telephone Encounter (Signed)
Received fax from [employer] stating,   "Caller states he is Ulice Dash, PT from [facility]. States he has a non urgent message for the doctor. States home health PT evaluated the pt today after coming home from the hospital due to pneumonia. Plan of care is 1 week x 1, 2 week x 5, and 1 week x 2 for strengthening and mobility training. Needs verbal order. Call back is [phone]

## 2018-06-22 NOTE — Telephone Encounter (Signed)
OK to give verbal.

## 2018-06-23 DIAGNOSIS — J189 Pneumonia, unspecified organism: Secondary | ICD-10-CM | POA: Diagnosis not present

## 2018-06-23 DIAGNOSIS — J9601 Acute respiratory failure with hypoxia: Secondary | ICD-10-CM | POA: Diagnosis not present

## 2018-06-23 DIAGNOSIS — A419 Sepsis, unspecified organism: Secondary | ICD-10-CM | POA: Diagnosis not present

## 2018-06-23 DIAGNOSIS — J440 Chronic obstructive pulmonary disease with acute lower respiratory infection: Secondary | ICD-10-CM | POA: Diagnosis not present

## 2018-06-23 DIAGNOSIS — I129 Hypertensive chronic kidney disease with stage 1 through stage 4 chronic kidney disease, or unspecified chronic kidney disease: Secondary | ICD-10-CM | POA: Diagnosis not present

## 2018-06-23 DIAGNOSIS — J441 Chronic obstructive pulmonary disease with (acute) exacerbation: Secondary | ICD-10-CM | POA: Diagnosis not present

## 2018-06-24 DIAGNOSIS — F3113 Bipolar disorder, current episode manic without psychotic features, severe: Secondary | ICD-10-CM | POA: Diagnosis not present

## 2018-06-25 ENCOUNTER — Other Ambulatory Visit: Payer: Self-pay | Admitting: *Deleted

## 2018-06-25 DIAGNOSIS — J440 Chronic obstructive pulmonary disease with acute lower respiratory infection: Secondary | ICD-10-CM | POA: Diagnosis not present

## 2018-06-25 DIAGNOSIS — J441 Chronic obstructive pulmonary disease with (acute) exacerbation: Secondary | ICD-10-CM | POA: Diagnosis not present

## 2018-06-25 DIAGNOSIS — I129 Hypertensive chronic kidney disease with stage 1 through stage 4 chronic kidney disease, or unspecified chronic kidney disease: Secondary | ICD-10-CM | POA: Diagnosis not present

## 2018-06-25 DIAGNOSIS — J189 Pneumonia, unspecified organism: Secondary | ICD-10-CM | POA: Diagnosis not present

## 2018-06-25 DIAGNOSIS — J9601 Acute respiratory failure with hypoxia: Secondary | ICD-10-CM | POA: Diagnosis not present

## 2018-06-25 DIAGNOSIS — A419 Sepsis, unspecified organism: Secondary | ICD-10-CM | POA: Diagnosis not present

## 2018-06-25 NOTE — Patient Outreach (Signed)
[facility]) Care Management  06/25/2018  Tony Fowler June 04, 1945 [id number]   EMMI-general discharge from [facility]  RED ON EMMI ALERT   Date: 06/21/18 Sunday Day 1  Red alert reason Questions about discharge papers? Yes  Day  # 4 06/24/18 1052  Red Alert Reason: Sad/hopeless/anxious/empty?Yes  Insurance: medicare  [facility] admissions x 1 ED visits x 1 in the last 6 months   EMMI  Questions about d/c  Tony Fowler states this answer documented is in correct He states he understands his discharge instructions Transition of care services noted to be completed by primary care MD office staff Dr [facility]   Outreach attempt # 1 successful at the home number Patient is able to verify HIPAA [facility] Care Management RN reviewed and addressed red alert with patient   Social: Tony Fowler is a 73 year old divorced male who is independent in his care and has his daughter as support He denies issues with transportation to medical appointments   Conditions: HTN, reflux, BPH, tobacco use disorder, severe manic bipolar affective disorder with psychotic behavior tremor, inguinal hernia, hyperchloremia, left knee pain, sepsis  malnutrition takes ensure   DME walker, cane shower chair without back  Medications: denies concerns with taking medications as prescribed, affording medications, side effects of medications and questions about medications    Appointments: He reports f/u with Dr Dossie Arbour 07/01/18   Advance Directives: Denies need for assist with advance directives   Consent: [facility] RN CM reviewed [facility] services with patient. Patient gave verbal consent for services.   Advised patient that there will be further automated EMMI- post discharge calls to assess how the patient is doing following the recent hospitalization Advised the patient that another call may be received from a nurse if any of their responses were abnormal. Patient voiced understanding and  was appreciative of f/u call.   Plan: [facility] RN CM will close case at this time as patient has been assessed and no needs identified/needs resolved.   Pt encouraged to return a call to [facility] RN CM prn  [facility] RN CM sent a successful outreach letter as discussed with [facility] brochure enclosed for review  Kimberly L. Noelle Penner, RN, BSN, CCM [facility] Telephonic Care Management Care Coordinator Office number [phone] Mobile number [phone]  Main [facility] number [phone] Fax number [phone]

## 2018-06-30 DIAGNOSIS — J441 Chronic obstructive pulmonary disease with (acute) exacerbation: Secondary | ICD-10-CM | POA: Diagnosis not present

## 2018-06-30 DIAGNOSIS — A419 Sepsis, unspecified organism: Secondary | ICD-10-CM | POA: Diagnosis not present

## 2018-06-30 DIAGNOSIS — J189 Pneumonia, unspecified organism: Secondary | ICD-10-CM | POA: Diagnosis not present

## 2018-06-30 DIAGNOSIS — J440 Chronic obstructive pulmonary disease with acute lower respiratory infection: Secondary | ICD-10-CM | POA: Diagnosis not present

## 2018-06-30 DIAGNOSIS — I129 Hypertensive chronic kidney disease with stage 1 through stage 4 chronic kidney disease, or unspecified chronic kidney disease: Secondary | ICD-10-CM | POA: Diagnosis not present

## 2018-06-30 DIAGNOSIS — J9601 Acute respiratory failure with hypoxia: Secondary | ICD-10-CM | POA: Diagnosis not present

## 2018-07-01 ENCOUNTER — Encounter: Payer: Self-pay | Admitting: Family Medicine

## 2018-07-01 ENCOUNTER — Ambulatory Visit (INDEPENDENT_AMBULATORY_CARE_PROVIDER_SITE_OTHER): Payer: Medicare Other | Admitting: Family Medicine

## 2018-07-01 DIAGNOSIS — B356 Tinea cruris: Secondary | ICD-10-CM | POA: Diagnosis not present

## 2018-07-01 DIAGNOSIS — I10 Essential (primary) hypertension: Secondary | ICD-10-CM

## 2018-07-01 DIAGNOSIS — K2100 Gastro-esophageal reflux disease with esophagitis, without bleeding: Secondary | ICD-10-CM

## 2018-07-01 DIAGNOSIS — N401 Enlarged prostate with lower urinary tract symptoms: Secondary | ICD-10-CM | POA: Diagnosis not present

## 2018-07-01 DIAGNOSIS — B351 Tinea unguium: Secondary | ICD-10-CM | POA: Diagnosis not present

## 2018-07-01 DIAGNOSIS — N138 Other obstructive and reflux uropathy: Secondary | ICD-10-CM | POA: Diagnosis not present

## 2018-07-01 DIAGNOSIS — R251 Tremor, unspecified: Secondary | ICD-10-CM

## 2018-07-01 MED ORDER — CLOTRIMAZOLE-BETAMETHASONE 1-0.05 % EX CREA: 1.0000 "application " | TOPICAL_CREAM | Freq: Two times a day (BID) | CUTANEOUS | 0 refills | Status: DC

## 2018-07-01 MED ORDER — PROPRANOLOL HCL 10 MG PO TABS: 10.0000 mg | ORAL_TABLET | Freq: Three times a day (TID) | ORAL | 5 refills | Status: DC

## 2018-07-01 NOTE — Assessment & Plan Note (Signed)
Discussed tapering gabapentin observing response with drowsiness and any positive effect it may be having on his tremor also use of propanolol.

## 2018-07-01 NOTE — Assessment & Plan Note (Signed)
Will refer to podiatry when safe to do so Covid-19  regarding onychomycosis changes and foot care.

## 2018-07-01 NOTE — Progress Notes (Signed)
There were no vitals taken for this visit.   Subjective:    Patient ID: Tony Fowler, male    DOB: 05/11/1945, 73 y.o.   MRN: [id number]  HPI: Tony Fowler is a 73 y.o. male  [place] visit  Virtual Visit via video  Note Today's visit performed via video visit due to COVID-19 isolation precautions I connected with Christella Hartigan  on 07/01/18 at  EDT by telephone and verified that I am speaking with the correct person using two identifiers.  Hospital F/U 2 daughter present who assist with history.  I discussed the limitations, risks, security and privacy concerns of performing an evaluation and management service by telephone and the availability of in person appointments. I also discussed with the patient that there may be a patient responsible charge related to this service. The patient expressed understanding and agreed to proceed.  Discussed with patient and daughter is concerned about gabapentin causing too much drowsiness patient has been on high dose to assist with his tremors.  Has not noticed any improvement in tremors and is interested in tapering and discontinuing gabapentin and starting propanolol to see if that would help. Concerned about also low blood pressure had blood pressure this morning of 90/60 and held lisinopril.  Discussed not taking lisinopril being cautious about propanolol as that also affects blood pressure. Also concerned about redness on his scrotum and inflammation of fungal changes needing some kind of medication. Also concerned about his toenails swelling podiatry referral for further foot care. Also reviewed transition of care with hospital follow-up see below for details.  Transition of Care Hospital Follow up.   Hospital/Facility:[facility] D/C Physician: Pyreddy D/C Date: 06-18-18  Records Requested: N/A Records Received:  Records Reviewed: today  Diagnoses on Discharge: pnenomia  Date of interactive Contact within 48 hours of discharge:   Contact was through: phone  Date of 7 day or 14 day face-to-face visit:    14  Outpatient Encounter Medications as of 07/01/2018  Medication Sig  . albuterol (PROVENTIL HFA;VENTOLIN HFA) 108 (90 Base) MCG/ACT inhaler Inhale 2 puffs into the lungs every 6 (six) hours as needed for wheezing or shortness of breath.  . ARIPiprazole (ABILIFY) 5 MG tablet Take by mouth.   Marland Kitchen aspirin EC 81 MG tablet Take 81 mg by mouth daily.  . carbamazepine (TEGRETOL) 200 MG tablet Take 200 mg by mouth 3 (three) times daily.  . clotrimazole-betamethasone (LOTRISONE) cream Apply 1 application topically 2 (two) times daily.  . feeding supplement, ENSURE ENLIVE, (ENSURE ENLIVE) LIQD Take 237 mLs by mouth 3 (three) times daily between meals.  . fluticasone (FLONASE) 50 MCG/ACT nasal spray Place 2 sprays into both nostrils daily.  Marland Kitchen gabapentin (NEURONTIN) 300 MG capsule Take 600 mg by mouth 3 (three) times daily.   Marland Kitchen ipratropium-albuterol (DUONEB) 0.5-2.5 (3) MG/3ML SOLN Take 3 mLs by nebulization every 6 (six) hours as needed for up to 30 days.  . Multiple Vitamin (MULTIVITAMIN) tablet Take 1 tablet by mouth daily.  Marland Kitchen omeprazole (PRILOSEC) 40 MG capsule Take 1 capsule (40 mg total) by mouth daily.  . predniSONE (DELTASONE) 10 MG tablet Take 1 tablet (10 mg total) by mouth daily. Label  & dispense according to the schedule below.  6 tablets day one, then 5 table day 2, then 4 tablets day 3, then 3 tablets day 4, 2 tablets day 5, then 1 tablet day 6, then stop  . propranolol (INDERAL) 10 MG tablet Take 1 tablet (10 mg total) by mouth 3 (  three) times daily.  . tamsulosin (FLOMAX) 0.4 MG CAPS capsule Take 1 capsule (0.4 mg total) by mouth daily.  . [DISCONTINUED] lisinopril (PRINIVIL,ZESTRIL) 10 MG tablet Take 1 tablet (10 mg total) by mouth daily.   No facility-administered encounter medications on file as of 07/01/2018.     Diagnostic Tests Reviewed/Disposition: done  Consults:none  Discharge Instructionsstart pt  which is held for now  Disease/illness Education:done  Home Health/Community Services Discussions/Referrals:n/a  Establishment or re-establishment of referral orders for community resources:n/a  Discussion with other health care providers:n/a  Assessment and Support of treatment regimen adherence:done  Appointments Coordinated with:   Education for self-management, independent living, and ADLs: done Relevant past medical, surgical, family and social history reviewed and updated as indicated. Interim medical history since our last visit reviewed. Allergies and medications reviewed and updated.  Review of Systems  Constitutional: Negative.   Respiratory: Negative.   Cardiovascular: Negative.     Per HPI unless specifically indicated above     Objective:    There were no vitals taken for this visit.  Wt Readings from Last 3 Encounters:  06/13/18 137 lb (62.1 kg)  01/08/18 140 lb 8 oz (63.7 kg)  10/15/17 141 lb 6.4 oz (64.1 kg)    Physical Exam None but by video with O2 tube in place     Assessment & Plan:   Problem List Items Addressed This Visit      Cardiovascular and Mediastinum   Benign hypertension (Chronic)    Discussed hypertension and hypotension will discontinue lisinopril start propanolol which may do double duty for blood pressure and tremor will observe does response for blood pressure and/or tremor.      Relevant Medications   propranolol (INDERAL) 10 MG tablet     Musculoskeletal and Integument   Jock itch    Discussed care and treatment will give prescription for Lotrisone for 1 week followed by over-the-counter spray      Relevant Medications   clotrimazole-betamethasone (LOTRISONE) cream   Onychomycosis    Will refer to podiatry when safe to do so Covid-19  regarding onychomycosis changes and foot care.      Relevant Medications   clotrimazole-betamethasone (LOTRISONE) cream   Other Relevant Orders   Ambulatory referral to Podiatry      Other   Tremor    Discussed tapering gabapentin observing response with drowsiness and any positive effect it may be having on his tremor also use of propanolol.      Relevant Medications   propranolol (INDERAL) 10 MG tablet       Follow up plan: Return in about 3 months (around 10/01/2018).

## 2018-07-01 NOTE — Assessment & Plan Note (Signed)
Discussed hypertension and hypotension will discontinue lisinopril start propanolol which may do double duty for blood pressure and tremor will observe does response for blood pressure and/or tremor.

## 2018-07-01 NOTE — Assessment & Plan Note (Signed)
Discussed care and treatment will give prescription for Lotrisone for 1 week followed by over-the-counter spray

## 2018-07-02 DIAGNOSIS — J441 Chronic obstructive pulmonary disease with (acute) exacerbation: Secondary | ICD-10-CM | POA: Diagnosis not present

## 2018-07-02 DIAGNOSIS — J189 Pneumonia, unspecified organism: Secondary | ICD-10-CM | POA: Diagnosis not present

## 2018-07-02 DIAGNOSIS — J9601 Acute respiratory failure with hypoxia: Secondary | ICD-10-CM | POA: Diagnosis not present

## 2018-07-02 DIAGNOSIS — I129 Hypertensive chronic kidney disease with stage 1 through stage 4 chronic kidney disease, or unspecified chronic kidney disease: Secondary | ICD-10-CM | POA: Diagnosis not present

## 2018-07-02 DIAGNOSIS — J440 Chronic obstructive pulmonary disease with acute lower respiratory infection: Secondary | ICD-10-CM | POA: Diagnosis not present

## 2018-07-02 DIAGNOSIS — A419 Sepsis, unspecified organism: Secondary | ICD-10-CM | POA: Diagnosis not present

## 2018-07-02 MED ORDER — OMEPRAZOLE 40 MG PO CPDR: 40.0000 mg | DELAYED_RELEASE_CAPSULE | Freq: Every day | ORAL | 3 refills | Status: DC

## 2018-07-02 MED ORDER — TAMSULOSIN HCL 0.4 MG PO CAPS: 0.4000 mg | ORAL_CAPSULE | Freq: Every day | ORAL | 3 refills | Status: DC

## 2018-07-02 NOTE — Addendum Note (Signed)
Addended by: Vonita Moss A on: 07/02/2018 02:48 PM   Modules accepted: Orders

## 2018-07-07 DIAGNOSIS — I129 Hypertensive chronic kidney disease with stage 1 through stage 4 chronic kidney disease, or unspecified chronic kidney disease: Secondary | ICD-10-CM | POA: Diagnosis not present

## 2018-07-07 DIAGNOSIS — J9601 Acute respiratory failure with hypoxia: Secondary | ICD-10-CM | POA: Diagnosis not present

## 2018-07-07 DIAGNOSIS — J441 Chronic obstructive pulmonary disease with (acute) exacerbation: Secondary | ICD-10-CM | POA: Diagnosis not present

## 2018-07-07 DIAGNOSIS — A419 Sepsis, unspecified organism: Secondary | ICD-10-CM | POA: Diagnosis not present

## 2018-07-07 DIAGNOSIS — J440 Chronic obstructive pulmonary disease with acute lower respiratory infection: Secondary | ICD-10-CM | POA: Diagnosis not present

## 2018-07-07 DIAGNOSIS — J189 Pneumonia, unspecified organism: Secondary | ICD-10-CM | POA: Diagnosis not present

## 2018-07-09 DIAGNOSIS — A419 Sepsis, unspecified organism: Secondary | ICD-10-CM | POA: Diagnosis not present

## 2018-07-09 DIAGNOSIS — J9601 Acute respiratory failure with hypoxia: Secondary | ICD-10-CM | POA: Diagnosis not present

## 2018-07-09 DIAGNOSIS — I129 Hypertensive chronic kidney disease with stage 1 through stage 4 chronic kidney disease, or unspecified chronic kidney disease: Secondary | ICD-10-CM | POA: Diagnosis not present

## 2018-07-09 DIAGNOSIS — J189 Pneumonia, unspecified organism: Secondary | ICD-10-CM | POA: Diagnosis not present

## 2018-07-09 DIAGNOSIS — J440 Chronic obstructive pulmonary disease with acute lower respiratory infection: Secondary | ICD-10-CM | POA: Diagnosis not present

## 2018-07-09 DIAGNOSIS — J441 Chronic obstructive pulmonary disease with (acute) exacerbation: Secondary | ICD-10-CM | POA: Diagnosis not present

## 2018-07-14 DIAGNOSIS — J9601 Acute respiratory failure with hypoxia: Secondary | ICD-10-CM | POA: Diagnosis not present

## 2018-07-14 DIAGNOSIS — A419 Sepsis, unspecified organism: Secondary | ICD-10-CM | POA: Diagnosis not present

## 2018-07-14 DIAGNOSIS — J440 Chronic obstructive pulmonary disease with acute lower respiratory infection: Secondary | ICD-10-CM | POA: Diagnosis not present

## 2018-07-14 DIAGNOSIS — J441 Chronic obstructive pulmonary disease with (acute) exacerbation: Secondary | ICD-10-CM | POA: Diagnosis not present

## 2018-07-14 DIAGNOSIS — J189 Pneumonia, unspecified organism: Secondary | ICD-10-CM | POA: Diagnosis not present

## 2018-07-14 DIAGNOSIS — I129 Hypertensive chronic kidney disease with stage 1 through stage 4 chronic kidney disease, or unspecified chronic kidney disease: Secondary | ICD-10-CM | POA: Diagnosis not present

## 2018-07-15 ENCOUNTER — Ambulatory Visit: Payer: Medicare Other | Admitting: Family Medicine

## 2018-07-16 DIAGNOSIS — I129 Hypertensive chronic kidney disease with stage 1 through stage 4 chronic kidney disease, or unspecified chronic kidney disease: Secondary | ICD-10-CM | POA: Diagnosis not present

## 2018-07-16 DIAGNOSIS — J440 Chronic obstructive pulmonary disease with acute lower respiratory infection: Secondary | ICD-10-CM | POA: Diagnosis not present

## 2018-07-16 DIAGNOSIS — J441 Chronic obstructive pulmonary disease with (acute) exacerbation: Secondary | ICD-10-CM | POA: Diagnosis not present

## 2018-07-16 DIAGNOSIS — A419 Sepsis, unspecified organism: Secondary | ICD-10-CM | POA: Diagnosis not present

## 2018-07-16 DIAGNOSIS — J189 Pneumonia, unspecified organism: Secondary | ICD-10-CM | POA: Diagnosis not present

## 2018-07-16 DIAGNOSIS — J9601 Acute respiratory failure with hypoxia: Secondary | ICD-10-CM | POA: Diagnosis not present

## 2018-08-12 DIAGNOSIS — F3289 Other specified depressive episodes: Secondary | ICD-10-CM | POA: Diagnosis not present

## 2018-08-12 DIAGNOSIS — G251 Drug-induced tremor: Secondary | ICD-10-CM | POA: Diagnosis not present

## 2018-10-14 ENCOUNTER — Other Ambulatory Visit: Payer: Self-pay

## 2018-10-14 ENCOUNTER — Ambulatory Visit (INDEPENDENT_AMBULATORY_CARE_PROVIDER_SITE_OTHER): Payer: Medicare Other | Admitting: Nurse Practitioner

## 2018-10-14 ENCOUNTER — Encounter: Payer: Self-pay | Admitting: Nurse Practitioner

## 2018-10-14 DIAGNOSIS — N3001 Acute cystitis with hematuria: Secondary | ICD-10-CM | POA: Diagnosis not present

## 2018-10-14 DIAGNOSIS — N390 Urinary tract infection, site not specified: Secondary | ICD-10-CM | POA: Insufficient documentation

## 2018-10-14 MED ORDER — CIPROFLOXACIN HCL 500 MG PO TABS: 500.0000 mg | ORAL_TABLET | Freq: Two times a day (BID) | ORAL | 0 refills | Status: AC

## 2018-10-14 NOTE — Progress Notes (Addendum)
There were no vitals taken for this visit.   Subjective:    Patient ID: Tony Fowler, male    DOB: 1945-09-14, 73 y.o.   MRN: [id number]  HPI: Tony Fowler is a 73 y.o. male  Chief Complaint  Patient presents with  . Urinary Tract Infection    pt states he had burning, pain and pressure a few days ago- states he also has occasional chills    . This visit was completed via telephone due to the restrictions of the COVID-19 pandemic. All issues as above were discussed and addressed but no physical exam was performed. If it was felt that the patient should be evaluated in the office, they were directed there. The patient verbally consented to this visit. Patient was unable to complete an audio/visual visit due to Lack of equipment. Due to the catastrophic nature of the COVID-19 pandemic, this visit was done through audio contact only. . Location of the patient: home . Location of the provider: work . Those involved with this call:  . Provider: Marnee Guarneri, DNP . CMA: Gerda Diss, CMA . Front Desk/Registration: Jill Side  . Time spent on call: 15 minutes on the phone discussing health concerns. 5 minutes total spent in review of patient's record and preparation of their chart. I verified patient identity using two factors (patient name and date of birth). Patient consents verbally to being seen via telemedicine visit today.   URINARY SYMPTOMS Has been having frequent urination and chills, started two days ago.  His daughter is present during visit on telephone and endorses current symptoms.  No recent abx use, last in March, or recent UTI. Dysuria: burning Urinary frequency: yes Urgency: yes Small volume voids: yes Symptom severity: yes Urinary incontinence: no Foul odor: yes Hematuria: two days ago Abdominal pain: no Back pain: no Suprapubic pain/pressure: yes Flank pain: no Fever:  chills at home Vomiting: yes Relief with cranberry juice: yes Relief with pyridium: no  Status: worse Previous urinary tract infection: no Recurrent urinary tract infection: no Sexual activity: Not sexually active History of sexually transmitted disease: no Penile discharge: no Treatments attempted: cranberry and increasing fluids   March labs note GFR > 60 and CRT 1.07  Relevant past medical, surgical, family and social history reviewed and updated as indicated. Interim medical history since our last visit reviewed. Allergies and medications reviewed and updated.  Review of Systems  Constitutional: Negative for activity change, diaphoresis, fatigue and fever.  Respiratory: Negative for cough, chest tightness, shortness of breath and wheezing.   Cardiovascular: Negative for chest pain, palpitations and leg swelling.  Gastrointestinal: Negative for abdominal distention, abdominal pain, constipation, diarrhea, nausea and vomiting.  Genitourinary: Positive for decreased urine volume, dysuria, frequency, hematuria and urgency. Negative for discharge, flank pain, penile pain and penile swelling.  Musculoskeletal: Negative.   Skin: Negative.   Neurological: Negative for dizziness, syncope, weakness, light-headedness, numbness and headaches.  Psychiatric/Behavioral: Negative.     Per HPI unless specifically indicated above     Objective:    There were no vitals taken for this visit.  Wt Readings from Last 3 Encounters:  06/13/18 137 lb (62.1 kg)  01/08/18 140 lb 8 oz (63.7 kg)  10/15/17 141 lb 6.4 oz (64.1 kg)    Physical Exam   Unable to perform due to telephone visit only.  Results for orders placed or performed during the hospital encounter of 06/13/18  Blood culture (routine x 2)   Specimen: BLOOD  Result Value Ref Range  Specimen Description BLOOD LEFT FA    Special Requests      BOTTLES DRAWN AEROBIC AND ANAEROBIC Blood Culture results may not be optimal due to an inadequate volume of blood received in culture bottles   Culture      NO GROWTH 5 DAYS  Performed at [facility], [address] [place], [place] [address]    Report Status 06/18/2018 FINAL   Blood culture (routine x 2)   Specimen: BLOOD  Result Value Ref Range   Specimen Description BLOOD RT WRIST    Special Requests      BOTTLES DRAWN AEROBIC AND ANAEROBIC Blood Culture adequate volume   Culture      NO GROWTH 5 DAYS Performed at [facility], [address] [place], [place] [address]    Report Status 06/18/2018 FINAL   Respiratory Panel by PCR   Specimen: Nasopharyngeal Swab; Respiratory  Result Value Ref Range   Adenovirus NOT DETECTED NOT DETECTED   Coronavirus 229E NOT DETECTED NOT DETECTED   Coronavirus HKU1 NOT DETECTED NOT DETECTED   Coronavirus NL63 NOT DETECTED NOT DETECTED   Coronavirus OC43 NOT DETECTED NOT DETECTED   Metapneumovirus NOT DETECTED NOT DETECTED   Rhinovirus / Enterovirus NOT DETECTED NOT DETECTED   Influenza A NOT DETECTED NOT DETECTED   Influenza B NOT DETECTED NOT DETECTED   Parainfluenza Virus 1 NOT DETECTED NOT DETECTED   Parainfluenza Virus 2 NOT DETECTED NOT DETECTED   Parainfluenza Virus 3 NOT DETECTED NOT DETECTED   Parainfluenza Virus 4 NOT DETECTED NOT DETECTED   Respiratory Syncytial Virus NOT DETECTED NOT DETECTED   Bordetella pertussis NOT DETECTED NOT DETECTED   Chlamydophila pneumoniae NOT DETECTED NOT DETECTED   Mycoplasma pneumoniae NOT DETECTED NOT DETECTED  C difficile quick scan w PCR reflex   Specimen: STOOL  Result Value Ref Range   C Diff antigen NEGATIVE NEGATIVE   C Diff toxin NEGATIVE NEGATIVE   C Diff interpretation No C. difficile detected.   Gastrointestinal Panel by PCR , Stool   Specimen: STOOL  Result Value Ref Range   Campylobacter species NOT DETECTED NOT DETECTED   Plesimonas shigelloides NOT DETECTED NOT DETECTED   Salmonella species NOT DETECTED NOT DETECTED   Yersinia enterocolitica NOT DETECTED NOT DETECTED   Vibrio species NOT DETECTED NOT DETECTED   Vibrio  cholerae NOT DETECTED NOT DETECTED   Enteroaggregative E coli (EAEC) NOT DETECTED NOT DETECTED   Enteropathogenic E coli (EPEC) NOT DETECTED NOT DETECTED   Enterotoxigenic E coli (ETEC) NOT DETECTED NOT DETECTED   Shiga like toxin producing E coli (STEC) NOT DETECTED NOT DETECTED   Shigella/Enteroinvasive E coli (EIEC) NOT DETECTED NOT DETECTED   Cryptosporidium NOT DETECTED NOT DETECTED   Cyclospora cayetanensis NOT DETECTED NOT DETECTED   Entamoeba histolytica NOT DETECTED NOT DETECTED   Giardia lamblia NOT DETECTED NOT DETECTED   Adenovirus F40/41 NOT DETECTED NOT DETECTED   Astrovirus NOT DETECTED NOT DETECTED   Norovirus GI/GII NOT DETECTED NOT DETECTED   Rotavirus A NOT DETECTED NOT DETECTED   Sapovirus (I, II, IV, and V) NOT DETECTED NOT DETECTED  Basic metabolic panel  Result Value Ref Range   Sodium 134 (L) 135 - 145 mmol/L   Potassium 4.4 3.5 - 5.1 mmol/L   Chloride 103 98 - 111 mmol/L   CO2 18 (L) 22 - 32 mmol/L   Glucose, Bld 171 (H) 70 - 99 mg/dL   BUN 25 (H) 8 - 23 mg/dL   Creatinine, Ser 1.301.42 (  H) 0.61 - 1.24 mg/dL   Calcium 16.110.0 8.9 - 09.610.3 mg/dL   GFR calc non Af Amer 49 (L) >60 mL/min   GFR calc Af Amer 57 (L) >60 mL/min   Anion gap 13 5 - 15  CBC  Result Value Ref Range   WBC 19.6 (H) 4.0 - 10.5 K/uL   RBC 4.56 4.22 - 5.81 MIL/uL   Hemoglobin 14.1 13.0 - 17.0 g/dL   HCT 04.541.1 40.939.0 - 81.152.0 %   MCV 90.1 80.0 - 100.0 fL   MCH 30.9 26.0 - 34.0 pg   MCHC 34.3 30.0 - 36.0 g/dL   RDW 91.413.3 78.211.5 - 95.615.5 %   Platelets 258 150 - 400 K/uL   nRBC 0.0 0.0 - 0.2 %  Urinalysis, Complete w Microscopic  Result Value Ref Range   Color, Urine AMBER (A) YELLOW   APPearance HAZY (A) CLEAR   Specific Gravity, Urine 1.026 1.005 - 1.030   pH 5.0 5.0 - 8.0   Glucose, UA NEGATIVE NEGATIVE mg/dL   Hgb urine dipstick NEGATIVE NEGATIVE   Bilirubin Urine SMALL (A) NEGATIVE   Ketones, ur NEGATIVE NEGATIVE mg/dL   Protein, ur 213100 (A) NEGATIVE mg/dL   Nitrite NEGATIVE NEGATIVE    Leukocytes,Ua NEGATIVE NEGATIVE   RBC / HPF 6-10 0 - 5 RBC/hpf   WBC, UA 0-5 0 - 5 WBC/hpf   Bacteria, UA RARE (A) NONE SEEN   Squamous Epithelial / LPF 0-5 0 - 5   Mucus PRESENT    Sperm, UA PRESENT   Troponin I - ONCE - STAT  Result Value Ref Range   Troponin I 0.03 (HH) <0.03 ng/mL  Brain natriuretic peptide  Result Value Ref Range   B Natriuretic Peptide 204.0 (H) 0.0 - 100.0 pg/mL  Influenza panel by PCR (type A & B)  Result Value Ref Range   Influenza A By PCR NEGATIVE NEGATIVE   Influenza B By PCR NEGATIVE NEGATIVE  Comprehensive metabolic panel  Result Value Ref Range   Sodium 136 135 - 145 mmol/L   Potassium 4.3 3.5 - 5.1 mmol/L   Chloride 106 98 - 111 mmol/L   CO2 21 (L) 22 - 32 mmol/L   Glucose, Bld 150 (H) 70 - 99 mg/dL   BUN 25 (H) 8 - 23 mg/dL   Creatinine, Ser 0.861.42 (H) 0.61 - 1.24 mg/dL   Calcium 8.8 (L) 8.9 - 10.3 mg/dL   Total Protein 6.1 (L) 6.5 - 8.1 g/dL   Albumin 2.5 (L) 3.5 - 5.0 g/dL   AST 26 15 - 41 U/L   ALT 18 0 - 44 U/L   Alkaline Phosphatase 49 38 - 126 U/L   Total Bilirubin 0.7 0.3 - 1.2 mg/dL   GFR calc non Af Amer 49 (L) >60 mL/min   GFR calc Af Amer 57 (L) >60 mL/min   Anion gap 9 5 - 15  Lactic acid, plasma  Result Value Ref Range   Lactic Acid, Venous 2.7 (HH) 0.5 - 1.9 mmol/L  Lactic acid, plasma  Result Value Ref Range   Lactic Acid, Venous 2.9 (HH) 0.5 - 1.9 mmol/L  Basic metabolic panel  Result Value Ref Range   Sodium 137 135 - 145 mmol/L   Potassium 4.8 3.5 - 5.1 mmol/L   Chloride 107 98 - 111 mmol/L   CO2 20 (L) 22 - 32 mmol/L   Glucose, Bld 174 (H) 70 - 99 mg/dL   BUN 27 (H) 8 - 23 mg/dL   Creatinine,  Ser 1.57 (H) 0.61 - 1.24 mg/dL   Calcium 9.4 8.9 - 16.1 mg/dL   GFR calc non Af Amer 44 (L) >60 mL/min   GFR calc Af Amer 51 (L) >60 mL/min   Anion gap 10 5 - 15  CBC  Result Value Ref Range   WBC 15.7 (H) 4.0 - 10.5 K/uL   RBC 3.92 (L) 4.22 - 5.81 MIL/uL   Hemoglobin 12.1 (L) 13.0 - 17.0 g/dL   HCT 09.6 (L) 04.5 -  52.0 %   MCV 93.1 80.0 - 100.0 fL   MCH 30.9 26.0 - 34.0 pg   MCHC 33.2 30.0 - 36.0 g/dL   RDW 40.9 81.1 - 91.4 %   Platelets 233 150 - 400 K/uL   nRBC 0.0 0.0 - 0.2 %  Magnesium  Result Value Ref Range   Magnesium 1.7 1.7 - 2.4 mg/dL  Procalcitonin  Result Value Ref Range   Procalcitonin 28.60 ng/mL  Protime-INR  Result Value Ref Range   Prothrombin Time 16.6 (H) 11.4 - 15.2 seconds   INR 1.4 (H) 0.8 - 1.2  APTT  Result Value Ref Range   aPTT 35 24 - 36 seconds  Lactic acid, plasma  Result Value Ref Range   Lactic Acid, Venous 2.8 (HH) 0.5 - 1.9 mmol/L  Lactic acid, plasma  Result Value Ref Range   Lactic Acid, Venous 1.3 0.5 - 1.9 mmol/L  Basic metabolic panel  Result Value Ref Range   Sodium 139 135 - 145 mmol/L   Potassium 4.2 3.5 - 5.1 mmol/L   Chloride 113 (H) 98 - 111 mmol/L   CO2 18 (L) 22 - 32 mmol/L   Glucose, Bld 152 (H) 70 - 99 mg/dL   BUN 33 (H) 8 - 23 mg/dL   Creatinine, Ser 7.82 0.61 - 1.24 mg/dL   Calcium 95.6 8.9 - 21.3 mg/dL   GFR calc non Af Amer >60 >60 mL/min   GFR calc Af Amer >60 >60 mL/min   Anion gap 8 5 - 15  CBC  Result Value Ref Range   WBC 17.8 (H) 4.0 - 10.5 K/uL   RBC 3.61 (L) 4.22 - 5.81 MIL/uL   Hemoglobin 11.2 (L) 13.0 - 17.0 g/dL   HCT 08.6 (L) 57.8 - 46.9 %   MCV 94.5 80.0 - 100.0 fL   MCH 31.0 26.0 - 34.0 pg   MCHC 32.8 30.0 - 36.0 g/dL   RDW 62.9 52.8 - 41.3 %   Platelets 269 150 - 400 K/uL   nRBC 0.0 0.0 - 0.2 %  CBC  Result Value Ref Range   WBC 15.0 (H) 4.0 - 10.5 K/uL   RBC 3.58 (L) 4.22 - 5.81 MIL/uL   Hemoglobin 11.3 (L) 13.0 - 17.0 g/dL   HCT 24.4 (L) 01.0 - 27.2 %   MCV 93.3 80.0 - 100.0 fL   MCH 31.6 26.0 - 34.0 pg   MCHC 33.8 30.0 - 36.0 g/dL   RDW 53.6 64.4 - 03.4 %   Platelets 333 150 - 400 K/uL   nRBC 0.0 0.0 - 0.2 %      Assessment & Plan:   Problem List Items Addressed This Visit      Genitourinary   UTI (urinary tract infection)    Acute with 3+ blood, positive nitrites, 1+ ketone, 1+ leuks,  and many bacteria.  Will send for culture.  Due to current symptoms and risk for sepsis, will send in script for Ciprofloxacin at this time x 7  days treatment.  Upon return of culture will change if needed.  Discussed with patient and daughter to continue cranberry pills and increase fluid intake + may take Tylenol as needed for discomfort or chills.  For worsening or continue symptoms return to office immediately or go to closest ER if after hours.      Relevant Orders   UA/M w/rflx Culture, Routine      I discussed the assessment and treatment plan with the patient. The patient was provided an opportunity to ask questions and all were answered. The patient agreed with the plan and demonstrated an understanding of the instructions.   The patient was advised to call back or seek an in-person evaluation if the symptoms worsen or if the condition fails to improve as anticipated.   I provided 15 minutes of time during this encounter.  Follow up plan: Return if symptoms worsen or fail to improve.

## 2018-10-14 NOTE — Assessment & Plan Note (Signed)
Acute with 3+ blood, positive nitrites, 1+ ketone, 1+ leuks, and many bacteria.  Will send for culture.  Due to current symptoms and risk for sepsis, will send in script for Ciprofloxacin at this time x 7 days treatment.  Upon return of culture will change if needed.  Discussed with patient and daughter to continue cranberry pills and increase fluid intake + may take Tylenol as needed for discomfort or chills.  For worsening or continue symptoms return to office immediately or go to closest ER if after hours.

## 2018-10-14 NOTE — Patient Instructions (Signed)
Urinary Tract Infection, Adult A urinary tract infection (UTI) is an infection of any part of the urinary tract. The urinary tract includes:  The kidneys.  The ureters.  The bladder.  The urethra. These organs make, store, and get rid of pee (urine) in the body. What are the causes? This is caused by germs (bacteria) in your genital area. These germs grow and cause swelling (inflammation) of your urinary tract. What increases the risk? You are more likely to develop this condition if:  You have a small, thin tube (catheter) to drain pee.  You cannot control when you pee or poop (incontinence).  You are male, and: ? You use these methods to prevent pregnancy: ? A medicine that kills sperm (spermicide). ? A device that blocks sperm (diaphragm). ? You have low levels of a male hormone (estrogen). ? You are pregnant.  You have genes that add to your risk.  You are sexually active.  You take antibiotic medicines.  You have trouble peeing because of: ? A prostate that is bigger than normal, if you are male. ? A blockage in the part of your body that drains pee from the bladder (urethra). ? A kidney stone. ? A nerve condition that affects your bladder (neurogenic bladder). ? Not getting enough to drink. ? Not peeing often enough.  You have other conditions, such as: ? Diabetes. ? A weak disease-fighting system (immune system). ? Sickle cell disease. ? Gout. ? Injury of the spine. What are the signs or symptoms? Symptoms of this condition include:  Needing to pee right away (urgently).  Peeing often.  Peeing small amounts often.  Pain or burning when peeing.  Blood in the pee.  Pee that smells bad or not like normal.  Trouble peeing.  Pee that is cloudy.  Fluid coming from the vagina, if you are male.  Pain in the belly or lower back. Other symptoms include:  Throwing up (vomiting).  No urge to eat.  Feeling mixed up (confused).  Being tired  and grouchy (irritable).  A fever.  Watery poop (diarrhea). How is this treated? This condition may be treated with:  Antibiotic medicine.  Other medicines.  Drinking enough water. Follow these instructions at home:  Medicines  Take over-the-counter and prescription medicines only as told by your doctor.  If you were prescribed an antibiotic medicine, take it as told by your doctor. Do not stop taking it even if you start to feel better. General instructions  Make sure you: ? Pee until your bladder is empty. ? Do not hold pee for a long time. ? Empty your bladder after sex. ? Wipe from front to back after pooping if you are a male. Use each tissue one time when you wipe.  Drink enough fluid to keep your pee pale yellow.  Keep all follow-up visits as told by your doctor. This is important. Contact a doctor if:  You do not get better after 1-2 days.  Your symptoms go away and then come back. Get help right away if:  You have very bad back pain.  You have very bad pain in your lower belly.  You have a fever.  You are sick to your stomach (nauseous).  You are throwing up. Summary  A urinary tract infection (UTI) is an infection of any part of the urinary tract.  This condition is caused by germs in your genital area.  There are many risk factors for a UTI. These include having a small, thin   tube to drain pee and not being able to control when you pee or poop.  Treatment includes antibiotic medicines for germs.  Drink enough fluid to keep your pee pale yellow. This information is not intended to replace advice given to you by your health care provider. Make sure you discuss any questions you have with your health care provider. Document Released: 09/11/2007 Document Revised: 03/12/2018 Document Reviewed: 10/02/2017 [employer]vier Patient Education  2020 [employer]  

## 2018-10-17 LAB — UA/M W/RFLX CULTURE, ROUTINE
Bilirubin, UA: NEGATIVE
Glucose, UA: NEGATIVE
Nitrite, UA: POSITIVE — AB
Specific Gravity, UA: 1.025 (ref 1.005–1.030)
Urobilinogen, Ur: 0.2 mg/dL (ref 0.2–1.0)
pH, UA: 5.5 (ref 5.0–7.5)

## 2018-10-17 LAB — MICROSCOPIC EXAMINATION
RBC, Urine: >30 /hpf — AB (ref 0–2)
WBC, UA: >30 /hpf — AB (ref 0–5)

## 2018-10-17 LAB — URINE CULTURE, REFLEX

## 2018-12-09 DIAGNOSIS — F3113 Bipolar disorder, current episode manic without psychotic features, severe: Secondary | ICD-10-CM | POA: Diagnosis not present

## 2018-12-23 ENCOUNTER — Other Ambulatory Visit: Payer: Self-pay

## 2018-12-23 ENCOUNTER — Ambulatory Visit (INDEPENDENT_AMBULATORY_CARE_PROVIDER_SITE_OTHER): Payer: Medicare Other | Admitting: Family Medicine

## 2018-12-23 ENCOUNTER — Ambulatory Visit (INDEPENDENT_AMBULATORY_CARE_PROVIDER_SITE_OTHER): Payer: Medicare Other

## 2018-12-23 ENCOUNTER — Encounter: Payer: Self-pay | Admitting: Family Medicine

## 2018-12-23 ENCOUNTER — Other Ambulatory Visit: Payer: Self-pay | Admitting: Family Medicine

## 2018-12-23 VITALS — BP 146/84 | HR 74 | Temp 97.5°F | Resp 16 | Ht 66.0 in | Wt 150.8 lb

## 2018-12-23 DIAGNOSIS — K2100 Gastro-esophageal reflux disease with esophagitis, without bleeding: Secondary | ICD-10-CM

## 2018-12-23 DIAGNOSIS — I10 Essential (primary) hypertension: Secondary | ICD-10-CM | POA: Diagnosis not present

## 2018-12-23 DIAGNOSIS — N401 Enlarged prostate with lower urinary tract symptoms: Secondary | ICD-10-CM

## 2018-12-23 DIAGNOSIS — N138 Other obstructive and reflux uropathy: Secondary | ICD-10-CM

## 2018-12-23 DIAGNOSIS — Z7189 Other specified counseling: Secondary | ICD-10-CM | POA: Diagnosis not present

## 2018-12-23 DIAGNOSIS — Z23 Encounter for immunization: Secondary | ICD-10-CM

## 2018-12-23 DIAGNOSIS — G40909 Epilepsy, unspecified, not intractable, without status epilepticus: Secondary | ICD-10-CM | POA: Diagnosis not present

## 2018-12-23 DIAGNOSIS — R251 Tremor, unspecified: Secondary | ICD-10-CM | POA: Diagnosis not present

## 2018-12-23 DIAGNOSIS — F312 Bipolar disorder, current episode manic severe with psychotic features: Secondary | ICD-10-CM | POA: Diagnosis not present

## 2018-12-23 DIAGNOSIS — Z0000 Encounter for general adult medical examination without abnormal findings: Secondary | ICD-10-CM | POA: Diagnosis not present

## 2018-12-23 DIAGNOSIS — Z1211 Encounter for screening for malignant neoplasm of colon: Secondary | ICD-10-CM | POA: Diagnosis not present

## 2018-12-23 LAB — URINALYSIS, ROUTINE W REFLEX MICROSCOPIC
Bilirubin, UA: NEGATIVE
Glucose, UA: NEGATIVE
Ketones, UA: NEGATIVE
Leukocytes,UA: NEGATIVE
Nitrite, UA: NEGATIVE
Protein,UA: NEGATIVE
RBC, UA: NEGATIVE
Specific Gravity, UA: <1.005 — ABNORMAL LOW (ref 1.005–1.030)
Urobilinogen, Ur: 0.2 mg/dL (ref 0.2–1.0)
pH, UA: 6 (ref 5.0–7.5)

## 2018-12-23 MED ORDER — TAMSULOSIN HCL 0.4 MG PO CAPS: 0.4000 mg | ORAL_CAPSULE | Freq: Every day | ORAL | 4 refills | Status: DC

## 2018-12-23 MED ORDER — PROPRANOLOL HCL 10 MG PO TABS: 10.0000 mg | ORAL_TABLET | Freq: Three times a day (TID) | ORAL | 12 refills | Status: DC

## 2018-12-23 MED ORDER — OMEPRAZOLE 40 MG PO CPDR: 40.0000 mg | DELAYED_RELEASE_CAPSULE | Freq: Every day | ORAL | 4 refills | Status: DC

## 2018-12-23 NOTE — Assessment & Plan Note (Signed)
80% gone and doing well

## 2018-12-23 NOTE — Assessment & Plan Note (Signed)
A voluntary discussion about advanced care planning including explanation and discussion of advanced directives was extentively discussed with the patient.  Explained about the healthcare proxy and living will was reviewed and packet with forms with expiration of how to fill them out was given.  Time spent: Encounter 16+ min individuals present: Patient 

## 2018-12-23 NOTE — Progress Notes (Signed)
Subjective:   Tony Fowler is a 73 y.o. male who presents for Medicare Annual/Subsequent preventive examination.  Review of Systems:   Cardiac Risk Factors include: advanced age (>5855men, 38>65 women);male gender;dyslipidemia;hypertension     Objective:    Vitals: BP (!) 146/84 (BP Location: Right Arm, Patient Position: Sitting, Cuff Size: Normal)   Pulse 74   Temp (!) 97.5 F (36.4 C) (Oral)   Resp 16   Ht 5\' 6"  (1.676 m)   Wt 150 lb 12.8 oz (68.4 kg)   SpO2 95%   BMI 24.34 kg/m   Body mass index is 24.34 kg/m.  Advanced Directives 12/23/2018 06/25/2018 06/13/2018 06/13/2018 06/25/2017 04/20/2016  Does Patient Have a Medical Advance Directive? Yes No No No No No  Type of Advance Directive Living will;Healthcare Power of Attorney - - - - -  Copy of Healthcare Power of Attorney in Chart? No - copy requested - - - - -  Would patient like information on creating a medical advance directive? - - Yes (Inpatient - patient requests chaplain consult to create a medical advance directive) No - Patient declined Yes (MAU/Ambulatory/Procedural Areas - Information given) -  Some encounter information is confidential and restricted. Go to Review Flowsheets activity to see all data.    Tobacco Social History   Tobacco Use  Smoking Status Former Smoker  . Packs/day: 0.50  . Years: 0.00  . Pack years: 0.00  . Types: Cigarettes  . Quit date: 06/13/2018  . Years since quitting: 0.5  Smokeless Tobacco Never Used     Counseling given: Not Answered   Clinical Intake:  Pre-visit preparation completed: Yes  Pain : 0-10 Pain Score: 2  Pain Type: Chronic pain Pain Location: Knee Pain Orientation: Left Pain Descriptors / Indicators: Aching Pain Onset: More than a month ago Pain Frequency: Intermittent(when ambulating)     Nutritional Risks: None Diabetes: No  How often do you need to have someone help you when you read instructions, pamphlets, or other written materials from your doctor  or pharmacy?: 1 - Never  Interpreter Needed?: No  Information entered by :: Humaira Sculley,LPN  Past Medical History:  Diagnosis Date  . Bipolar 1 disorder (HCC)   . Coarse tremors   . Hypertension    Past Surgical History:  Procedure Laterality Date  . TONSILECTOMY/ADENOIDECTOMY WITH MYRINGOTOMY     Family History  Problem Relation Age of Onset  . Heart disease Mother   . Hypertension Mother   . Mental illness Mother   . Heart disease Brother   . Hypertension Brother    Social History   Socioeconomic History  . Marital status: Divorced    Spouse name: Not on file  . Number of children: Not on file  . Years of education: Not on file  . Highest education level: Not on file  Occupational History  . Not on file  Social Needs  . Financial resource strain: Not hard at all  . Food insecurity    Worry: Never true    Inability: Never true  . Transportation needs    Medical: No    Non-medical: No  Tobacco Use  . Smoking status: Former Smoker    Packs/day: 0.50    Years: 0.00    Pack years: 0.00    Types: Cigarettes    Quit date: 06/13/2018    Years since quitting: 0.5  . Smokeless tobacco: Never Used  Substance and Sexual Activity  . Alcohol use: No    Alcohol/week: 0.0  standard drinks    Comment: 1 beer occasionally   . Drug use: No  . Sexual activity: Not on file  Lifestyle  . Physical activity    Days per week: 5 days    Minutes per session: 30 min  . Stress: Not at all  Relationships  . Social Herbalist on phone: Never    Gets together: More than three times a week    Attends religious service: Never    Active member of club or organization: No    Attends meetings of clubs or organizations: Never    Relationship status: Divorced  Other Topics Concern  . Not on file  Social History Narrative  . Not on file    Outpatient Encounter Medications as of 12/23/2018  Medication Sig  . ARIPiprazole (ABILIFY) 5 MG tablet Take by mouth.   Marland Kitchen aspirin  EC 81 MG tablet Take 81 mg by mouth daily.  . carbamazepine (TEGRETOL) 200 MG tablet Take 200 mg by mouth 3 (three) times daily.  . fluticasone (FLONASE) 50 MCG/ACT nasal spray Place 2 sprays into both nostrils daily.  Marland Kitchen gabapentin (NEURONTIN) 300 MG capsule Take 600 mg by mouth 3 (three) times daily.   . Multiple Vitamin (MULTIVITAMIN) tablet Take 1 tablet by mouth daily.  Marland Kitchen omeprazole (PRILOSEC) 40 MG capsule Take 1 capsule (40 mg total) by mouth daily.  . propranolol (INDERAL) 10 MG tablet Take 1 tablet (10 mg total) by mouth 3 (three) times daily.  . tamsulosin (FLOMAX) 0.4 MG CAPS capsule Take 1 capsule (0.4 mg total) by mouth daily.  . [DISCONTINUED] albuterol (PROVENTIL HFA;VENTOLIN HFA) 108 (90 Base) MCG/ACT inhaler Inhale 2 puffs into the lungs every 6 (six) hours as needed for wheezing or shortness of breath. (Patient not taking: Reported on 10/14/2018)  . [DISCONTINUED] clotrimazole-betamethasone (LOTRISONE) cream Apply 1 application topically 2 (two) times daily. (Patient not taking: Reported on 12/23/2018)  . [DISCONTINUED] feeding supplement, ENSURE ENLIVE, (ENSURE ENLIVE) LIQD Take 237 mLs by mouth 3 (three) times daily between meals. (Patient not taking: Reported on 10/14/2018)  . [DISCONTINUED] ipratropium-albuterol (DUONEB) 0.5-2.5 (3) MG/3ML SOLN Take 3 mLs by nebulization every 6 (six) hours as needed for up to 30 days.   No facility-administered encounter medications on file as of 12/23/2018.     Activities of Daily Living In your present state of health, do you have any difficulty performing the following activities: 12/23/2018 06/13/2018  Hearing? N N  Comment no hearing aids -  Vision? Y N  Comment has cataract in right eye,  reading glasses, no eye dr. Marland Kitchen  Difficulty concentrating or making decisions? N N  Walking or climbing stairs? Y Y  Comment knee pain -  Dressing or bathing? N N  Doing errands, shopping? N N  Preparing Food and eating ? N -  Using the Toilet? N -  In  the past six months, have you accidently leaked urine? N -  Do you have problems with loss of bowel control? N -  Managing your Medications? N -  Managing your Finances? N -  Housekeeping or managing your Housekeeping? N -  Some recent data might be hidden    Patient Care Team: Guadalupe Maple, MD as PCP - General (Family Medicine)   Assessment:   This is a routine wellness examination for Obie.  Exercise Activities and Dietary recommendations Current Exercise Habits: Home exercise routine, Type of exercise: walking, Time (Minutes): 30, Frequency (Times/Week): 5, Weekly Exercise (Minutes/Week): 150,  Intensity: Mild, Exercise limited by: None identified  Goals    . Quit Smoking     Smoking cessation discussed       Fall Risk: Fall Risk  12/23/2018 06/25/2017 01/02/2017 07/02/2016 05/16/2016  Falls in the past year? 0 No Yes Yes Yes  Number falls in past yr: - - 1 2 or more 2 or more  Injury with Fall? - - No No No    FALL RISK PREVENTION PERTAINING TO THE HOME:  Any stairs in or around the home? Yes  If so, are there any without handrails? No   Home free of loose throw rugs in walkways, pet beds, electrical cords, etc? Yes  Adequate lighting in your home to reduce risk of falls? Yes   ASSISTIVE DEVICES UTILIZED TO PREVENT FALLS:  Life alert? No  Use of a cane, walker or w/c? Yes  walker when walking, cane  Grab bars in the bathroom? No  Shower chair or bench in shower? Yes  Elevated toilet seat or a handicapped toilet? No   TIMED UP AND GO:  Was the test performed? Yes .  Length of time to ambulate 10 feet: 10 sec.   GAIT:  Appearance of gait: Gait steady and fast without the use of an assistive device.  Education: Fall risk prevention has been discussed.  Intervention(s) required? No  DME/home health order needed?  No   Depression Screen PHQ 2/9 Scores 12/23/2018 06/25/2018 06/25/2017 01/02/2017  PHQ - 2 Score 0 0 0 2  PHQ- 9 Score - - - 2    Cognitive  Function     6CIT Screen 12/23/2018 06/25/2017  What Year? 0 points 0 points  What month? 0 points 0 points  What time? 0 points 0 points  Count back from 20 0 points 0 points  Months in reverse 0 points 0 points  Repeat phrase 0 points 0 points  Total Score 0 0    Immunization History  Administered Date(s) Administered  . Fluad Quad(high Dose 65+) 12/23/2018  . Influenza, High Dose Seasonal PF 05/16/2016, 01/02/2017, 01/08/2018  . Influenza,inj,Quad PF,6+ Mos 02/22/2015  . Influenza-Unspecified 02/16/2014  . PPD Test 06/18/2018  . Pneumococcal Conjugate-13 02/22/2015  . Pneumococcal-Unspecified 01/15/2012  . Td 12/14/2012    Qualifies for Shingles Vaccine? No, patient did not have chicken pox  Tdap: up to date   Flu Vaccine: Due for Flu vaccine. Does the patient want to receive this vaccine today?  Yes .   Pneumococcal Vaccine: up to date   Screening Tests Health Maintenance  Topic Date Due  . COLON CANCER SCREENING ANNUAL FOBT  01/10/1997  . COLONOSCOPY  12/23/2019 (Originally 01/10/1997)  . TETANUS/TDAP  12/15/2022  . INFLUENZA VACCINE  Completed  . Hepatitis C Screening  Completed  . PNA vac Low Risk Adult  Completed   Cancer Screenings:  Colorectal Screening: declined cologuard, ordered FOBT. Patient understands to bring this to the office once completed   Lung Cancer Screening: (Low Dose CT Chest recommended if Age 75-80 years, 30 pack-year currently smoking OR have quit w/in 15years.) does qualify.  declined   Additional Screening:  Hepatitis C Screening: does qualify; Completed 05/16/2016  Vision Screening: Recommended annual ophthalmology exams for early detection of glaucoma and other disorders of the eye. Is the patient up to date with their annual eye exam?  No   Dental Screening: Recommended annual dental exams for proper oral hygiene  Community Resource Referral:  CRR required this visit?  No  Plan:  I have personally reviewed and  addressed the Medicare Annual Wellness questionnaire and have noted the following in the patient's chart:  A. Medical and social history B. Use of alcohol, tobacco or illicit drugs  C. Current medications and supplements D. Functional ability and status E.  Nutritional status F.  Physical activity G. Advance directives H. List of other physicians I.  Hospitalizations, surgeries, and ER visits in previous 12 months J.  Vitals K. Screenings such as hearing and vision if needed, cognitive and depression L. Referrals and appointments   In addition, I have reviewed and discussed with patient certain preventive protocols, quality metrics, and best practice recommendations. A written personalized care plan for preventive services as well as general preventive health recommendations were provided to patient.   Signed,   Collene SchlichterHill, Adamarie Izzo A, LPN  5/62/13089/16/2020 Nurse Health Advisor   Nurse Notes: needs refill on propanolol

## 2018-12-23 NOTE — Progress Notes (Signed)
BP 136/84   Wt 150 lb (68 kg)   BMI 24.21 kg/m    Subjective:    Patient ID: Tony Fowler, male    DOB: 11/18/1945, 73 y.o.   MRN: [id number]  HPI: Tony CassisHerbert Fowler is a 73 y.o. male  Med check Discussed with patient tremor is about gone reports 80% with improvement. Reports advanced care planning done just has to have notarized documents.  And patient will bring copies by. BPH doing well with no complaints reflux also stable. Nerves stable without issues as his blood pressure.  Relevant past medical, surgical, family and social history reviewed and updated as indicated. Interim medical history since our last visit reviewed. Allergies and medications reviewed and updated.  Review of Systems  Constitutional: Negative.   HENT: Negative.   Eyes: Negative.   Respiratory: Negative.   Cardiovascular: Negative.   Gastrointestinal: Negative.   Endocrine: Negative.   Genitourinary: Negative.   Musculoskeletal: Negative.   Skin: Negative.   Allergic/Immunologic: Negative.   Neurological: Negative.   Hematological: Negative.   Psychiatric/Behavioral: Negative.     Per HPI unless specifically indicated above     Objective:    BP 136/84   Wt 150 lb (68 kg)   BMI 24.21 kg/m   Wt Readings from Last 3 Encounters:  12/23/18 150 lb (68 kg)  12/23/18 150 lb 12.8 oz (68.4 kg)  06/13/18 137 lb (62.1 kg)    Physical Exam  Results for orders placed or performed in visit on 10/14/18  Microscopic Examination   URINE  Result Value Ref Range   WBC, UA >30 (A) 0 - 5 /hpf   RBC >30 (A) 0 - 2 /hpf   Epithelial Cells (non renal) 0-10 0 - 10 /hpf   Casts Present None seen /lpf   Cast Type Hyaline casts N/A   Mucus, UA Present Not Estab.   Bacteria, UA Many (A) None seen/Few  Urine Culture, Reflex   URINE  Result Value Ref Range   Urine Culture, Routine Final report (A)    Organism ID, Bacteria Escherichia coli (A)    Antimicrobial Susceptibility Comment   UA/M w/rflx Culture,  Routine   Specimen: Urine   URINE  Result Value Ref Range   Specific Gravity, UA 1.025 1.005 - 1.030   pH, UA 5.5 5.0 - 7.5   Color, UA Yellow Yellow   Appearance Ur Hazy (A) Clear   Leukocytes,UA 1+ (A) Negative   Protein,UA 3+ (A) Negative/Trace   Glucose, UA Negative Negative   Ketones, UA 1+ (A) Negative   RBC, UA 3+ (A) Negative   Bilirubin, UA Negative Negative   Urobilinogen, Ur 0.2 0.2 - 1.0 mg/dL   Nitrite, UA Positive (A) Negative   Microscopic Examination See below:    Urinalysis Reflex Comment       Assessment & Plan:   Problem List Items Addressed This Visit      Cardiovascular and Mediastinum   Benign hypertension (Chronic)   Relevant Medications   propranolol (INDERAL) 10 MG tablet     Digestive   Reflux esophagitis    The current medical regimen is effective;  continue present plan and medications.       Relevant Medications   omeprazole (PRILOSEC) 40 MG capsule     Genitourinary   BPH with obstruction/lower urinary tract symptoms    The current medical regimen is effective;  continue present plan and medications.       Relevant Medications   tamsulosin (FLOMAX) 0.4  MG CAPS capsule     Other   Bipolar affective disorder, manic, severe, with psychotic behavior ([place])    The current medical regimen is effective;  continue present plan and medications.       Tremor    80% gone and doing well      Relevant Medications   propranolol (INDERAL) 10 MG tablet   Advanced care planning/counseling discussion    A voluntary discussion about advanced care planning including explanation and discussion of advanced directives was extentively discussed with the patient.  Explained about the healthcare proxy and living will was reviewed and packet with forms with expiration of how to fill them out was given.  Time spent: Encounter 16+ min individuals present: Patient          Telemedicine using audio/video telecommunications for a synchronous  communication visit. Today's visit due to COVID-19 isolation precautions I connected with and verified that I am speaking with the correct person using two identifiers.   I discussed the limitations, risks, security and privacy concerns of performing an evaluation and management service by telecommunication and the availability of in person appointments. I also discussed with the patient that there may be a patient responsible charge related to this service. The patient expressed understanding and agreed to proceed. The patient's location is home. I am at home.   I discussed the assessment and treatment plan with the patient. The patient was provided an opportunity to ask questions and all were answered. The patient agreed with the plan and demonstrated an understanding of the instructions.   The patient was advised to call back or seek an in-person evaluation if the symptoms worsen or if the condition fails to improve as anticipated.   I provided 21+ minutes of time during this encounter. Follow up plan: No follow-ups on file.

## 2018-12-23 NOTE — Assessment & Plan Note (Signed)
The current medical regimen is effective;  continue present plan and medications.  

## 2018-12-23 NOTE — Patient Instructions (Signed)
Tony Fowler , Thank you for taking time to come for your Medicare Wellness Visit. I appreciate your ongoing commitment to your health goals. Please review the following plan we discussed and let me know if I can assist you in the future.   Screening recommendations/referrals: Colonoscopy: declined colonoscopy, provided fecal occult testing  Recommended yearly ophthalmology/optometry visit for glaucoma screening and checkup Recommended yearly dental visit for hygiene and checkup  Vaccinations: Influenza vaccine: done today Pneumococcal vaccine: up to date Tdap vaccine: up to date Shingles vaccine: not indicated    Advanced directives: Please bring a copy of your health care power of attorney and living will to the office at your convenience.  Conditions/risks identified: none  Next appointment: Follow up in one year for your annual wellness visit   Preventive Care 65 Years and Older, Male Preventive care refers to lifestyle choices and visits with your health care provider that can promote health and wellness. What does preventive care include?  A yearly physical exam. This is also called an annual well check.  Dental exams once or twice a year.  Routine eye exams. Ask your health care provider how often you should have your eyes checked.  Personal lifestyle choices, including:  Daily care of your teeth and gums.  Regular physical activity.  Eating a healthy diet.  Avoiding tobacco and drug use.  Limiting alcohol use.  Practicing safe sex.  Taking low doses of aspirin every day.  Taking vitamin and mineral supplements as recommended by your health care provider. What happens during an annual well check? The services and screenings done by your health care provider during your annual well check will depend on your age, overall health, lifestyle risk factors, and family history of disease. Counseling  Your health care provider may ask you questions about your:  Alcohol  use.  Tobacco use.  Drug use.  Emotional well-being.  Home and relationship well-being.  Sexual activity.  Eating habits.  History of falls.  Memory and ability to understand (cognition).  Work and work Statistician. Screening  You may have the following tests or measurements:  Height, weight, and BMI.  Blood pressure.  Lipid and cholesterol levels. These may be checked every 5 years, or more frequently if you are over 14 years old.  Skin check.  Lung cancer screening. You may have this screening every year starting at age 67 if you have a 30-pack-year history of smoking and currently smoke or have quit within the past 15 years.  Fecal occult blood test (FOBT) of the stool. You may have this test every year starting at age 27.  Flexible sigmoidoscopy or colonoscopy. You may have a sigmoidoscopy every 5 years or a colonoscopy every 10 years starting at age 64.  Prostate cancer screening. Recommendations will vary depending on your family history and other risks.  Hepatitis C blood test.  Hepatitis B blood test.  Sexually transmitted disease (STD) testing.  Diabetes screening. This is done by checking your blood sugar (glucose) after you have not eaten for a while (fasting). You may have this done every 1-3 years.  Abdominal aortic aneurysm (AAA) screening. You may need this if you are a current or former smoker.  Osteoporosis. You may be screened starting at age 46 if you are at high risk. Talk with your health care provider about your test results, treatment options, and if necessary, the need for more tests. Vaccines  Your health care provider may recommend certain vaccines, such as:  Influenza vaccine.  This is recommended every year.  Tetanus, diphtheria, and acellular pertussis (Tdap, Td) vaccine. You may need a Td booster every 10 years.  Zoster vaccine. You may need this after age 50.  Pneumococcal 13-valent conjugate (PCV13) vaccine. One dose is  recommended after age 79.  Pneumococcal polysaccharide (PPSV23) vaccine. One dose is recommended after age 75. Talk to your health care provider about which screenings and vaccines you need and how often you need them. This information is not intended to replace advice given to you by your health care provider. Make sure you discuss any questions you have with your health care provider. Document Released: 04/21/2015 Document Revised: 12/13/2015 Document Reviewed: 01/24/2015 Elsevier Interactive Patient Education  2017 [employer] Prevention in the Home Falls can cause injuries. They can happen to people of all ages. There are many things you can do to make your home safe and to help prevent falls. What can I do on the outside of my home?  Regularly fix the edges of walkways and driveways and fix any cracks.  Remove anything that might make you trip as you walk through a door, such as a raised step or threshold.  Trim any bushes or trees on the path to your home.  Use bright outdoor lighting.  Clear any walking paths of anything that might make someone trip, such as rocks or tools.  Regularly check to see if handrails are loose or broken. Make sure that both sides of any steps have handrails.  Any raised decks and porches should have guardrails on the edges.  Have any leaves, snow, or ice cleared regularly.  Use sand or salt on walking paths during winter.  Clean up any spills in your garage right away. This includes oil or grease spills. What can I do in the bathroom?  Use night lights.  Install grab bars by the toilet and in the tub and shower. Do not use towel bars as grab bars.  Use non-skid mats or decals in the tub or shower.  If you need to sit down in the shower, use a plastic, non-slip stool.  Keep the floor dry. Clean up any water that spills on the floor as soon as it happens.  Remove soap buildup in the tub or shower regularly.  Attach bath mats  securely with double-sided non-slip rug tape.  Do not have throw rugs and other things on the floor that can make you trip. What can I do in the bedroom?  Use night lights.  Make sure that you have a light by your bed that is easy to reach.  Do not use any sheets or blankets that are too big for your bed. They should not hang down onto the floor.  Have a firm chair that has side arms. You can use this for support while you get dressed.  Do not have throw rugs and other things on the floor that can make you trip. What can I do in the kitchen?  Clean up any spills right away.  Avoid walking on wet floors.  Keep items that you use a lot in easy-to-reach places.  If you need to reach something above you, use a strong step stool that has a grab bar.  Keep electrical cords out of the way.  Do not use floor polish or wax that makes floors slippery. If you must use wax, use non-skid floor wax.  Do not have throw rugs and other things on the floor that can make  you trip. What can I do with my stairs?  Do not leave any items on the stairs.  Make sure that there are handrails on both sides of the stairs and use them. Fix handrails that are broken or loose. Make sure that handrails are as long as the stairways.  Check any carpeting to make sure that it is firmly attached to the stairs. Fix any carpet that is loose or worn.  Avoid having throw rugs at the top or bottom of the stairs. If you do have throw rugs, attach them to the floor with carpet tape.  Make sure that you have a light switch at the top of the stairs and the bottom of the stairs. If you do not have them, ask someone to add them for you. What else can I do to help prevent falls?  Wear shoes that:  Do not have high heels.  Have rubber bottoms.  Are comfortable and fit you well.  Are closed at the toe. Do not wear sandals.  If you use a stepladder:  Make sure that it is fully opened. Do not climb a closed  stepladder.  Make sure that both sides of the stepladder are locked into place.  Ask someone to hold it for you, if possible.  Clearly mark and make sure that you can see:  Any grab bars or handrails.  First and last steps.  Where the edge of each step is.  Use tools that help you move around (mobility aids) if they are needed. These include:  Canes.  Walkers.  Scooters.  Crutches.  Turn on the lights when you go into a dark area. Replace any light bulbs as soon as they burn out.  Set up your furniture so you have a clear path. Avoid moving your furniture around.  If any of your floors are uneven, fix them.  If there are any pets around you, be aware of where they are.  Review your medicines with your doctor. Some medicines can make you feel dizzy. This can increase your chance of falling. Ask your doctor what other things that you can do to help prevent falls. This information is not intended to replace advice given to you by your health care provider. Make sure you discuss any questions you have with your health care provider. Document Released: 01/19/2009 Document Revised: 08/31/2015 Document Reviewed: 04/29/2014 Elsevier Interactive Patient Education  2017 [employer].

## 2018-12-23 NOTE — Addendum Note (Signed)
Addended by: Tyler Aas A on: 12/23/2018 08:45 AM   Modules accepted: Orders

## 2018-12-24 LAB — COMPREHENSIVE METABOLIC PANEL
ALT: 18 IU/L (ref 0–44)
AST: 27 IU/L (ref 0–40)
Albumin/Globulin Ratio: 1.7 (ref 1.2–2.2)
Albumin: 4.3 g/dL (ref 3.7–4.7)
Alkaline Phosphatase: 140 IU/L — ABNORMAL HIGH (ref 39–117)
BUN/Creatinine Ratio: 12 (ref 10–24)
BUN: 16 mg/dL (ref 8–27)
Bilirubin Total: 0.3 mg/dL (ref 0.0–1.2)
CO2: 23 mmol/L (ref 20–29)
Calcium: 10 mg/dL (ref 8.6–10.2)
Chloride: 102 mmol/L (ref 96–106)
Creatinine, Ser: 1.36 mg/dL — ABNORMAL HIGH (ref 0.76–1.27)
GFR calc Af Amer: 60 mL/min/{1.73_m2} (ref >59)
GFR calc non Af Amer: 52 mL/min/{1.73_m2} — ABNORMAL LOW (ref >59)
Globulin, Total: 2.5 g/dL (ref 1.5–4.5)
Glucose: 87 mg/dL (ref 65–99)
Potassium: 4.7 mmol/L (ref 3.5–5.2)
Sodium: 140 mmol/L (ref 134–144)
Total Protein: 6.8 g/dL (ref 6.0–8.5)

## 2018-12-24 LAB — CBC WITH DIFFERENTIAL/PLATELET
Basophils Absolute: 0 10*3/uL (ref 0.0–0.2)
Basos: 1 %
EOS (ABSOLUTE): 0.2 10*3/uL (ref 0.0–0.4)
Eos: 3 %
Hematocrit: 43.1 % (ref 37.5–51.0)
Hemoglobin: 14.7 g/dL (ref 13.0–17.7)
Immature Grans (Abs): 0 10*3/uL (ref 0.0–0.1)
Immature Granulocytes: 0 %
Lymphocytes Absolute: 2.3 10*3/uL (ref 0.7–3.1)
Lymphs: 29 %
MCH: 30.5 pg (ref 26.6–33.0)
MCHC: 34.1 g/dL (ref 31.5–35.7)
MCV: 89 fL (ref 79–97)
Monocytes Absolute: 0.7 10*3/uL (ref 0.1–0.9)
Monocytes: 9 %
Neutrophils Absolute: 4.6 10*3/uL (ref 1.4–7.0)
Neutrophils: 58 %
Platelets: 256 10*3/uL (ref 150–450)
RBC: 4.82 x10E6/uL (ref 4.14–5.80)
RDW: 14.5 % (ref 11.6–15.4)
WBC: 7.9 10*3/uL (ref 3.4–10.8)

## 2018-12-24 LAB — PSA: Prostate Specific Ag, Serum: 5 ng/mL — ABNORMAL HIGH (ref 0.0–4.0)

## 2018-12-24 LAB — TSH: TSH: 1.56 u[IU]/mL (ref 0.450–4.500)

## 2018-12-24 LAB — LIPID PANEL
Chol/HDL Ratio: 4.7 ratio (ref 0.0–5.0)
Cholesterol, Total: 284 mg/dL — ABNORMAL HIGH (ref 100–199)
HDL: 61 mg/dL (ref >39)
LDL Chol Calc (NIH): 191 mg/dL — ABNORMAL HIGH (ref 0–99)
Triglycerides: 172 mg/dL — ABNORMAL HIGH (ref 0–149)
VLDL Cholesterol Cal: 32 mg/dL (ref 5–40)

## 2018-12-25 ENCOUNTER — Other Ambulatory Visit: Payer: Self-pay | Admitting: Family Medicine

## 2018-12-25 ENCOUNTER — Telehealth: Payer: Self-pay | Admitting: Family Medicine

## 2018-12-25 DIAGNOSIS — N183 Chronic kidney disease, stage 3 unspecified: Secondary | ICD-10-CM

## 2018-12-25 DIAGNOSIS — N138 Other obstructive and reflux uropathy: Secondary | ICD-10-CM

## 2018-12-25 DIAGNOSIS — E7800 Pure hypercholesterolemia, unspecified: Secondary | ICD-10-CM

## 2018-12-25 DIAGNOSIS — N401 Enlarged prostate with lower urinary tract symptoms: Secondary | ICD-10-CM

## 2018-12-25 DIAGNOSIS — R7989 Other specified abnormal findings of blood chemistry: Secondary | ICD-10-CM

## 2018-12-25 NOTE — Telephone Encounter (Signed)
Patient is calling regarding cholesterol medication that has not been called in.  Preferred [employer]

## 2018-12-25 NOTE — Telephone Encounter (Signed)
Routing to provider  

## 2018-12-25 NOTE — Progress Notes (Signed)
Phone call Discussed with patient elevated PSA will recheck in a couple of months. Discussed markedly elevated cholesterol patient now eating with his daughter and eating better than he has will start cholesterol medication and check lipids in a couple of months along with PSA Discussed renal function and elevated alkaline phosphatase will check again in a couple months.

## 2018-12-26 ENCOUNTER — Other Ambulatory Visit: Payer: Self-pay | Admitting: Family Medicine

## 2018-12-26 MED ORDER — ROSUVASTATIN CALCIUM 20 MG PO TABS: 20.0000 mg | ORAL_TABLET | Freq: Every day | ORAL | 3 refills | Status: DC

## 2019-01-07 ENCOUNTER — Telehealth: Payer: Self-pay | Admitting: Family Medicine

## 2019-01-07 NOTE — Chronic Care Management (AMB) (Signed)
Chronic Care Management   Note  01/07/2019 Name: Tony Fowler MRN: [id number] DOB: Mar 14, 1946  Tony Fowler is a 73 y.o. year old male who is a primary care patient of Crissman, Jeannette How, MD. I reached out to Orion Modest by phone today in response to a referral sent by [name] health plan.     [name] was given information about Chronic Care Management services today including:  1. CCM service includes personalized support from designated clinical staff supervised by his physician, including individualized plan of care and coordination with other care providers 2. 24/7 contact phone numbers for assistance for urgent and routine care needs. 3. Service will only be billed when office clinical staff spend 20 minutes or more in a month to coordinate care. 4. Only one practitioner may furnish and bill the service in a calendar month. 5. The patient may stop CCM services at any time (effective at the end of the month) by phone call to the office staff. 6. The patient will be responsible for cost sharing (co-pay) of up to 20% of the service fee (after annual deductible is met).  Patient agreed to services and verbal consent obtained.   Follow up plan: Telephone appointment with CCM team member scheduled for: 02/10/2019  [place]  [email or web address]   ??[phone]

## 2019-02-10 ENCOUNTER — Telehealth: Payer: Medicare Other

## 2019-03-03 DIAGNOSIS — F3113 Bipolar disorder, current episode manic without psychotic features, severe: Secondary | ICD-10-CM | POA: Diagnosis not present

## 2019-03-10 DIAGNOSIS — Z79899 Other long term (current) drug therapy: Secondary | ICD-10-CM | POA: Diagnosis not present

## 2019-03-12 DIAGNOSIS — G251 Drug-induced tremor: Secondary | ICD-10-CM | POA: Diagnosis not present

## 2019-03-19 ENCOUNTER — Telehealth: Payer: Medicare Other

## 2019-04-21 ENCOUNTER — Telehealth: Payer: Self-pay

## 2019-05-26 DIAGNOSIS — Z79899 Other long term (current) drug therapy: Secondary | ICD-10-CM | POA: Diagnosis not present

## 2019-05-26 DIAGNOSIS — F3113 Bipolar disorder, current episode manic without psychotic features, severe: Secondary | ICD-10-CM | POA: Diagnosis not present

## 2019-08-18 DIAGNOSIS — Z79899 Other long term (current) drug therapy: Secondary | ICD-10-CM | POA: Diagnosis not present

## 2019-08-18 DIAGNOSIS — F3113 Bipolar disorder, current episode manic without psychotic features, severe: Secondary | ICD-10-CM | POA: Diagnosis not present

## 2019-09-30 DIAGNOSIS — G251 Drug-induced tremor: Secondary | ICD-10-CM | POA: Diagnosis not present

## 2019-09-30 DIAGNOSIS — I10 Essential (primary) hypertension: Secondary | ICD-10-CM | POA: Diagnosis not present

## 2019-11-17 DIAGNOSIS — F3113 Bipolar disorder, current episode manic without psychotic features, severe: Secondary | ICD-10-CM | POA: Diagnosis not present

## 2019-12-03 ENCOUNTER — Encounter: Payer: Self-pay | Admitting: Nurse Practitioner

## 2019-12-08 ENCOUNTER — Encounter: Payer: Self-pay | Admitting: Nurse Practitioner

## 2019-12-08 ENCOUNTER — Ambulatory Visit (INDEPENDENT_AMBULATORY_CARE_PROVIDER_SITE_OTHER): Payer: Medicare Other | Admitting: Nurse Practitioner

## 2019-12-08 ENCOUNTER — Other Ambulatory Visit: Payer: Self-pay

## 2019-12-08 VITALS — BP 140/84 | HR 73 | Temp 98.5°F | Wt 163.0 lb

## 2019-12-08 DIAGNOSIS — R251 Tremor, unspecified: Secondary | ICD-10-CM

## 2019-12-08 DIAGNOSIS — Z23 Encounter for immunization: Secondary | ICD-10-CM

## 2019-12-08 DIAGNOSIS — F312 Bipolar disorder, current episode manic severe with psychotic features: Secondary | ICD-10-CM

## 2019-12-08 DIAGNOSIS — I10 Essential (primary) hypertension: Secondary | ICD-10-CM | POA: Diagnosis not present

## 2019-12-08 DIAGNOSIS — N138 Other obstructive and reflux uropathy: Secondary | ICD-10-CM

## 2019-12-08 DIAGNOSIS — N401 Enlarged prostate with lower urinary tract symptoms: Secondary | ICD-10-CM | POA: Diagnosis not present

## 2019-12-08 DIAGNOSIS — K219 Gastro-esophageal reflux disease without esophagitis: Secondary | ICD-10-CM | POA: Diagnosis not present

## 2019-12-08 DIAGNOSIS — K2100 Gastro-esophageal reflux disease with esophagitis, without bleeding: Secondary | ICD-10-CM | POA: Diagnosis not present

## 2019-12-08 DIAGNOSIS — E782 Mixed hyperlipidemia: Secondary | ICD-10-CM | POA: Insufficient documentation

## 2019-12-08 DIAGNOSIS — F17210 Nicotine dependence, cigarettes, uncomplicated: Secondary | ICD-10-CM

## 2019-12-08 LAB — MICROALBUMIN, URINE WAIVED
Creatinine, Urine Waived: 50 mg/dL (ref 10–300)
Microalb, Ur Waived: 10 mg/L (ref 0–19)

## 2019-12-08 MED ORDER — OMEPRAZOLE 40 MG PO CPDR: 40.0000 mg | DELAYED_RELEASE_CAPSULE | Freq: Every day | ORAL | 4 refills | Status: DC

## 2019-12-08 MED ORDER — PROPRANOLOL HCL 10 MG PO TABS: 10.0000 mg | ORAL_TABLET | Freq: Three times a day (TID) | ORAL | 13 refills | Status: DC

## 2019-12-08 MED ORDER — ROSUVASTATIN CALCIUM 20 MG PO TABS: 20.0000 mg | ORAL_TABLET | Freq: Every day | ORAL | 4 refills | Status: DC

## 2019-12-08 MED ORDER — TAMSULOSIN HCL 0.4 MG PO CAPS: 0.4000 mg | ORAL_CAPSULE | Freq: Every day | ORAL | 4 refills | Status: DC

## 2019-12-08 NOTE — Assessment & Plan Note (Signed)
Chronic, ongoing.  Initial BP elevated, but repeat improving.  Home BP's vary.  Have recommended adding on a low dose of Losartan, would avoid ACE as suspect some underlying COPD.  He refuses starting new medication today.  Will continue Propranolol for BP and tremor.  Recommend he monitor BP at home daily and document for provider visits + focus on DASH diet.  CMP and TSH today.  Refills sent in.  Return in 6 months.

## 2019-12-08 NOTE — Assessment & Plan Note (Signed)
Quit in March 2020 after PNA.  Recommend continued cessation.  Discussed at length CT lung CA screening with him and provided pamphlet on this for review, recommend he obtain this screening.  He will alert provider if interested after discussion with family.  Suspect some underlying COPD present.

## 2019-12-08 NOTE — Progress Notes (Signed)
BP 140/84 (BP Location: Left Arm)   Pulse 73   Temp 98.5 F (36.9 C) (Oral)   Wt 163 lb (73.9 kg)   SpO2 96%   BMI 26.31 kg/m    Subjective:    Patient ID: Tony Fowler, male    DOB: 04-26-1945, 74 y.o.   MRN: [id number]  HPI: Tony Fowler is a 74 y.o. male  Chief Complaint  Patient presents with  . Hypertension  . Hyperlipidemia  . Gastroesophageal Reflux   HYPERTENSION / HYPERLIPIDEMIA Continues on Propranolol 10 MG TID (for tremor and BP) and ASA + Crestor 20 MG daily.  BP elevated at office visit with neurology recently -- 178/102, they recommended he monitor BP at home -- using [employer].  Quit smoking on March 7th, 2020 due to pneumonia and hospitalization.  He had smoked since 74 years old, 1 PPD per day.    Continues on Tamsulosin for BPH. Satisfied with current treatment? yes Duration of hypertension: chronic BP monitoring frequency: daily BP range: <150/90 -- often higher than 130/80 BP medication side effects: no Duration of hyperlipidemia: chronic Cholesterol medication side effects: no Cholesterol supplements: none Medication compliance: good compliance Aspirin: yes Recent stressors: no Recurrent headaches: no Visual changes: no Palpitations: no Dyspnea: no Chest pain: no Lower extremity edema: no Dizzy/lightheaded: no  GERD Continues on Prilosec 40 MG daily. GERD control status: controlled  Satisfied with current treatment? yes Heartburn frequency: none Antacid use frequency:  none Dysphagia: no Odynophagia:  no Hematemesis: no Blood in stool: no EGD: no  BIPOLAR AFFECTIVE DISORDER: Is followed by psychiatry and last visit 08/18/19.  Continues on Abilify 5 MG daily and Tegretol 200 MG TID.  He is also being followed by neurology for drug-induced tremor, last seen at [facility] on 09/30/19 with plan to continue Gabapentin 300 MG QID and Propranolol 10 MG TID, he declined physical therapy at the recent visit with them.    Relevant past  medical, surgical, family and social history reviewed and updated as indicated. Interim medical history since our last visit reviewed. Allergies and medications reviewed and updated.  Review of Systems  Per HPI unless specifically indicated above     Objective:    BP 140/84 (BP Location: Left Arm)   Pulse 73   Temp 98.5 F (36.9 C) (Oral)   Wt 163 lb (73.9 kg)   SpO2 96%   BMI 26.31 kg/m   Wt Readings from Last 3 Encounters:  12/08/19 163 lb (73.9 kg)  12/23/18 150 lb (68 kg)  12/23/18 150 lb 12.8 oz (68.4 kg)    Physical Exam  Results for orders placed or performed in visit on 12/23/18  PSA  Result Value Ref Range   Prostate Specific Ag, Serum 5.0 (H) 0.0 - 4.0 ng/mL  Urinalysis, Routine w reflex microscopic  Result Value Ref Range   Specific Gravity, UA <1.005 (L) 1.005 - 1.030   pH, UA 6.0 5.0 - 7.5   Color, UA Yellow Yellow   Appearance Ur Clear Clear   Leukocytes,UA Negative Negative   Protein,UA Negative Negative/Trace   Glucose, UA Negative Negative   Ketones, UA Negative Negative   RBC, UA Negative Negative   Bilirubin, UA Negative Negative   Urobilinogen, Ur 0.2 0.2 - 1.0 mg/dL   Nitrite, UA Negative Negative  TSH  Result Value Ref Range   TSH 1.560 0.450 - 4.500 uIU/mL  CBC with Differential/Platelet  Result Value Ref Range   WBC 7.9 3.4 - 10.8 x10E3/uL  RBC 4.82 4.14 - 5.80 x10E6/uL   Hemoglobin 14.7 13.0 - 17.7 g/dL   Hematocrit 41.2 87.8 - 51.0 %   MCV 89 79 - 97 fL   MCH 30.5 26.6 - 33.0 pg   MCHC 34.1 31 - 35 g/dL   RDW 67.6 72.0 - 94.7 %   Platelets 256 150 - 450 x10E3/uL   Neutrophils 58 Not Estab. %   Lymphs 29 Not Estab. %   Monocytes 9 Not Estab. %   Eos 3 Not Estab. %   Basos 1 Not Estab. %   Neutrophils Absolute 4.6 1 - 7 x10E3/uL   Lymphocytes Absolute 2.3 0 - 3 x10E3/uL   Monocytes Absolute 0.7 0 - 0 x10E3/uL   EOS (ABSOLUTE) 0.2 0.0 - 0.4 x10E3/uL   Basophils Absolute 0.0 0 - 0 x10E3/uL   Immature Granulocytes 0 Not Estab. %    Immature Grans (Abs) 0.0 0.0 - 0.1 x10E3/uL  Lipid panel  Result Value Ref Range   Cholesterol, Total 284 (H) 100 - 199 mg/dL   Triglycerides 096 (H) 0 - 149 mg/dL   HDL 61 >28 mg/dL   VLDL Cholesterol Cal 32 5 - 40 mg/dL   LDL Chol Calc (NIH) 366 (H) 0 - 99 mg/dL   Chol/HDL Ratio 4.7 0.0 - 5.0 ratio  Comprehensive metabolic panel  Result Value Ref Range   Glucose 87 65 - 99 mg/dL   BUN 16 8 - 27 mg/dL   Creatinine, Ser 2.94 (H) 0.76 - 1.27 mg/dL   GFR calc non Af Amer 52 (L) >59 mL/min/1.73   GFR calc Af Amer 60 >59 mL/min/1.73   BUN/Creatinine Ratio 12 10 - 24   Sodium 140 134 - 144 mmol/L   Potassium 4.7 3.5 - 5.2 mmol/L   Chloride 102 96 - 106 mmol/L   CO2 23 20 - 29 mmol/L   Calcium 10.0 8.6 - 10.2 mg/dL   Total Protein 6.8 6.0 - 8.5 g/dL   Albumin 4.3 3.7 - 4.7 g/dL   Globulin, Total 2.5 1.5 - 4.5 g/dL   Albumin/Globulin Ratio 1.7 1.2 - 2.2   Bilirubin Total 0.3 0.0 - 1.2 mg/dL   Alkaline Phosphatase 140 (H) 39 - 117 IU/L   AST 27 0 - 40 IU/L   ALT 18 0 - 44 IU/L      Assessment & Plan:   Problem List Items Addressed This Visit      Cardiovascular and Mediastinum   Benign hypertension (Chronic)    Chronic, ongoing.  Initial BP elevated, but repeat improving.  Home BP's vary.  Have recommended adding on a low dose of Losartan, would avoid ACE as suspect some underlying COPD.  He refuses starting new medication today.  Will continue Propranolol for BP and tremor.  Recommend he monitor BP at home daily and document for provider visits + focus on DASH diet.  CMP and TSH today.  Refills sent in.  Return in 6 months.      Relevant Medications   rosuvastatin (CRESTOR) 20 MG tablet   propranolol (INDERAL) 10 MG tablet   Other Relevant Orders   Comprehensive metabolic panel   TSH   Microalbumin, Urine Waived     Digestive   Reflux esophagitis    Chronic, stable with Protonix.  Continue current regimen and consider trial reduction in future, if tolerated would  consider discontinuation.  Mag level today.  Return in 6 months.      Relevant Medications   omeprazole (PRILOSEC) 40 MG  capsule   Other Relevant Orders   Magnesium     Genitourinary   BPH with obstruction/lower urinary tract symptoms    Chronic, stable with no symptoms at this time.  Last PSA 5 in September 2020 -- he would like rechecked today.  Continue current medication regimen.  If elevation in PSA above goal for age 41.5 or greater, then consider urology referral.      Relevant Medications   tamsulosin (FLOMAX) 0.4 MG CAPS capsule   Other Relevant Orders   PSA     Other   Bipolar affective disorder, manic, severe, with psychotic behavior (HCC) - Primary    Chronic, stable.  Continue collaboration with psychiatry and regimen as prescribed by them.       Relevant Orders   CBC with Differential/Platelet   Nicotine dependence, cigarettes, uncomplicated    Quit in March 2020 after PNA.  Recommend continued cessation.  Discussed at length CT lung CA screening with him and provided pamphlet on this for review, recommend he obtain this screening.  He will alert provider if interested after discussion with family.  Suspect some underlying COPD present.      Tremor    Chronic, stable.  Will continue Propranolol, refills sent in, and continue collaboration with neurology.  Recent note reviewed.      Relevant Medications   propranolol (INDERAL) 10 MG tablet   Mixed hyperlipidemia    Chronic, ongoing.  Continue current medication regimen and adjust as needed.  CMP and lipid panel today.  Refills sent in.  Return in 6 months.      Relevant Medications   rosuvastatin (CRESTOR) 20 MG tablet   propranolol (INDERAL) 10 MG tablet   Other Relevant Orders   Lipid Panel w/o Chol/HDL Ratio       Follow up plan: Return in about 6 months (around 06/06/2020) for HTN/HLD, MOOD, GERD, BPH.

## 2019-12-08 NOTE — Assessment & Plan Note (Signed)
Chronic, ongoing.  Continue current medication regimen and adjust as needed.  CMP and lipid panel today.  Refills sent in.  Return in 6 months. 

## 2019-12-08 NOTE — Patient Instructions (Signed)
DASH Eating Plan DASH stands for "Dietary Approaches to Stop Hypertension." The DASH eating plan is a healthy eating plan that has been shown to reduce high blood pressure (hypertension). It may also reduce your risk for type 2 diabetes, heart disease, and stroke. The DASH eating plan may also help with weight loss. What are tips for following this plan?  General guidelines  Avoid eating more than 2,300 mg (milligrams) of salt (sodium) a day. If you have hypertension, you may need to reduce your sodium intake to 1,500 mg a day.  Limit alcohol intake to no more than 1 drink a day for nonpregnant women and 2 drinks a day for men. One drink equals 12 oz of beer, 5 oz of wine, or 1 oz of hard liquor.  Work with your health care provider to maintain a healthy body weight or to lose weight. Ask what an ideal weight is for you.  Get at least 30 minutes of exercise that causes your heart to beat faster (aerobic exercise) most days of the week. Activities may include walking, swimming, or biking.  Work with your health care provider or diet and nutrition specialist (dietitian) to adjust your eating plan to your individual calorie needs. Reading food labels   Check food labels for the amount of sodium per serving. Choose foods with less than 5 percent of the Daily Value of sodium. Generally, foods with less than 300 mg of sodium per serving fit into this eating plan.  To find whole grains, look for the word "whole" as the first word in the ingredient list. Shopping  Buy products labeled as "low-sodium" or "no salt added."  Buy fresh foods. Avoid canned foods and premade or frozen meals. Cooking  Avoid adding salt when cooking. Use salt-free seasonings or herbs instead of table salt or sea salt. Check with your health care provider or pharmacist before using salt substitutes.  Do not fry foods. Cook foods using healthy methods such as baking, boiling, grilling, and broiling instead.  Cook with  heart-healthy oils, such as olive, canola, soybean, or sunflower oil. Meal planning  Eat a balanced diet that includes: ? 5 or more servings of fruits and vegetables each day. At each meal, try to fill half of your plate with fruits and vegetables. ? Up to 6-8 servings of whole grains each day. ? Less than 6 oz of lean meat, poultry, or fish each day. A 3-oz serving of meat is about the same size as a deck of cards. One egg equals 1 oz. ? 2 servings of low-fat dairy each day. ? A serving of nuts, seeds, or beans 5 times each week. ? Heart-healthy fats. Healthy fats called Omega-3 fatty acids are found in foods such as flaxseeds and coldwater fish, like sardines, salmon, and mackerel.  Limit how much you eat of the following: ? Canned or prepackaged foods. ? Food that is high in trans fat, such as fried foods. ? Food that is high in saturated fat, such as fatty meat. ? Sweets, desserts, sugary drinks, and other foods with added sugar. ? Full-fat dairy products.  Do not salt foods before eating.  Try to eat at least 2 vegetarian meals each week.  Eat more home-cooked food and less restaurant, buffet, and fast food.  When eating at a restaurant, ask that your food be prepared with less salt or no salt, if possible. What foods are recommended? The items listed may not be a complete list. Talk with your dietitian about   what dietary choices are best for you. Grains Whole-grain or whole-wheat bread. Whole-grain or whole-wheat pasta. Brown rice. Oatmeal. Quinoa. Bulgur. Whole-grain and low-sodium cereals. Pita bread. Low-fat, low-sodium crackers. Whole-wheat flour tortillas. Vegetables Fresh or frozen vegetables (raw, steamed, roasted, or grilled). Low-sodium or reduced-sodium tomato and vegetable juice. Low-sodium or reduced-sodium tomato sauce and tomato paste. Low-sodium or reduced-sodium canned vegetables. Fruits All fresh, dried, or frozen fruit. Canned fruit in natural juice (without  added sugar). Meat and other protein foods Skinless chicken or turkey. Ground chicken or turkey. Pork with fat trimmed off. Fish and seafood. Egg whites. Dried beans, peas, or lentils. Unsalted nuts, nut butters, and seeds. Unsalted canned beans. Lean cuts of beef with fat trimmed off. Low-sodium, lean deli meat. Dairy Low-fat (1%) or fat-free (skim) milk. Fat-free, low-fat, or reduced-fat cheeses. Nonfat, low-sodium ricotta or cottage cheese. Low-fat or nonfat yogurt. Low-fat, low-sodium cheese. Fats and oils Soft margarine without trans fats. Vegetable oil. Low-fat, reduced-fat, or light mayonnaise and salad dressings (reduced-sodium). Canola, safflower, olive, soybean, and sunflower oils. Avocado. Seasoning and other foods Herbs. Spices. Seasoning mixes without salt. Unsalted popcorn and pretzels. Fat-free sweets. What foods are not recommended? The items listed may not be a complete list. Talk with your dietitian about what dietary choices are best for you. Grains Baked goods made with fat, such as croissants, muffins, or some breads. Dry pasta or rice meal packs. Vegetables Creamed or fried vegetables. Vegetables in a cheese sauce. Regular canned vegetables (not low-sodium or reduced-sodium). Regular canned tomato sauce and paste (not low-sodium or reduced-sodium). Regular tomato and vegetable juice (not low-sodium or reduced-sodium). Pickles. Olives. Fruits Canned fruit in a light or heavy syrup. Fried fruit. Fruit in cream or butter sauce. Meat and other protein foods Fatty cuts of meat. Ribs. Fried meat. Bacon. Sausage. Bologna and other processed lunch meats. Salami. Fatback. Hotdogs. Bratwurst. Salted nuts and seeds. Canned beans with added salt. Canned or smoked fish. Whole eggs or egg yolks. Chicken or turkey with skin. Dairy Whole or 2% milk, cream, and half-and-half. Whole or full-fat cream cheese. Whole-fat or sweetened yogurt. Full-fat cheese. Nondairy creamers. Whipped toppings.  Processed cheese and cheese spreads. Fats and oils Butter. Stick margarine. Lard. Shortening. Ghee. Bacon fat. Tropical oils, such as coconut, palm kernel, or palm oil. Seasoning and other foods Salted popcorn and pretzels. Onion salt, garlic salt, seasoned salt, table salt, and sea salt. Worcestershire sauce. Tartar sauce. Barbecue sauce. Teriyaki sauce. Soy sauce, including reduced-sodium. Steak sauce. Canned and packaged gravies. Fish sauce. Oyster sauce. Cocktail sauce. Horseradish that you find on the shelf. Ketchup. Mustard. Meat flavorings and tenderizers. Bouillon cubes. Hot sauce and Tabasco sauce. Premade or packaged marinades. Premade or packaged taco seasonings. Relishes. Regular salad dressings. Where to find more information:  National Heart, Lung, and Blood Institute: www.nhlbi.nih.gov  American Heart Association: www.heart.org Summary  The DASH eating plan is a healthy eating plan that has been shown to reduce high blood pressure (hypertension). It may also reduce your risk for type 2 diabetes, heart disease, and stroke.  With the DASH eating plan, you should limit salt (sodium) intake to 2,300 mg a day. If you have hypertension, you may need to reduce your sodium intake to 1,500 mg a day.  When on the DASH eating plan, aim to eat more fresh fruits and vegetables, whole grains, lean proteins, low-fat dairy, and heart-healthy fats.  Work with your health care provider or diet and nutrition specialist (dietitian) to adjust your eating plan to your   individual calorie needs. This information is not intended to replace advice given to you by your health care provider. Make sure you discuss any questions you have with your health care provider. Document Revised: 03/07/2017 Document Reviewed: 03/18/2016 Elsevier Patient Education  2020 Elsevier Inc.  

## 2019-12-08 NOTE — Assessment & Plan Note (Signed)
Chronic, stable.  Continue collaboration with psychiatry and regimen as prescribed by them.  

## 2019-12-08 NOTE — Assessment & Plan Note (Signed)
Chronic, stable.  Will continue Propranolol, refills sent in, and continue collaboration with neurology.  Recent note reviewed.

## 2019-12-08 NOTE — Assessment & Plan Note (Signed)
Chronic, stable with Protonix.  Continue current regimen and consider trial reduction in future, if tolerated would consider discontinuation.  Mag level today.  Return in 6 months.

## 2019-12-08 NOTE — Assessment & Plan Note (Signed)
Chronic, stable with no symptoms at this time.  Last PSA 5 in September 2020 -- he would like rechecked today.  Continue current medication regimen.  If elevation in PSA above goal for age 73.5 or greater, then consider urology referral.

## 2019-12-09 ENCOUNTER — Encounter: Payer: Self-pay | Admitting: Nurse Practitioner

## 2019-12-09 LAB — COMPREHENSIVE METABOLIC PANEL
ALT: 19 IU/L (ref 0–44)
AST: 23 IU/L (ref 0–40)
Albumin/Globulin Ratio: 1.8 (ref 1.2–2.2)
Albumin: 4.3 g/dL (ref 3.7–4.7)
Alkaline Phosphatase: 127 IU/L — ABNORMAL HIGH (ref 48–121)
BUN/Creatinine Ratio: 16 (ref 10–24)
BUN: 20 mg/dL (ref 8–27)
Bilirubin Total: 0.4 mg/dL (ref 0.0–1.2)
CO2: 24 mmol/L (ref 20–29)
Calcium: 9.7 mg/dL (ref 8.6–10.2)
Chloride: 104 mmol/L (ref 96–106)
Creatinine, Ser: 1.26 mg/dL (ref 0.76–1.27)
GFR calc Af Amer: 65 mL/min/{1.73_m2} (ref >59)
GFR calc non Af Amer: 57 mL/min/{1.73_m2} — ABNORMAL LOW (ref >59)
Globulin, Total: 2.4 g/dL (ref 1.5–4.5)
Glucose: 89 mg/dL (ref 65–99)
Potassium: 4.5 mmol/L (ref 3.5–5.2)
Sodium: 138 mmol/L (ref 134–144)
Total Protein: 6.7 g/dL (ref 6.0–8.5)

## 2019-12-09 LAB — CBC WITH DIFFERENTIAL/PLATELET
Basophils Absolute: 0 10*3/uL (ref 0.0–0.2)
Basos: 1 %
EOS (ABSOLUTE): 0.1 10*3/uL (ref 0.0–0.4)
Eos: 2 %
Hematocrit: 43.6 % (ref 37.5–51.0)
Hemoglobin: 15 g/dL (ref 13.0–17.7)
Immature Grans (Abs): 0 10*3/uL (ref 0.0–0.1)
Immature Granulocytes: 0 %
Lymphocytes Absolute: 1.5 10*3/uL (ref 0.7–3.1)
Lymphs: 21 %
MCH: 31.9 pg (ref 26.6–33.0)
MCHC: 34.4 g/dL (ref 31.5–35.7)
MCV: 93 fL (ref 79–97)
Monocytes Absolute: 0.6 10*3/uL (ref 0.1–0.9)
Monocytes: 8 %
Neutrophils Absolute: 4.8 10*3/uL (ref 1.4–7.0)
Neutrophils: 68 %
Platelets: 228 10*3/uL (ref 150–450)
RBC: 4.7 x10E6/uL (ref 4.14–5.80)
RDW: 13 % (ref 11.6–15.4)
WBC: 7.1 10*3/uL (ref 3.4–10.8)

## 2019-12-09 LAB — LIPID PANEL W/O CHOL/HDL RATIO
Cholesterol, Total: 190 mg/dL (ref 100–199)
HDL: 54 mg/dL (ref >39)
LDL Chol Calc (NIH): 106 mg/dL — ABNORMAL HIGH (ref 0–99)
Triglycerides: 172 mg/dL — ABNORMAL HIGH (ref 0–149)
VLDL Cholesterol Cal: 30 mg/dL (ref 5–40)

## 2019-12-09 LAB — PSA: Prostate Specific Ag, Serum: 4.2 ng/mL — ABNORMAL HIGH (ref 0.0–4.0)

## 2019-12-09 LAB — TSH: TSH: 1.18 u[IU]/mL (ref 0.450–4.500)

## 2019-12-09 LAB — MAGNESIUM: Magnesium: 2 mg/dL (ref 1.6–2.3)

## 2019-12-09 NOTE — Progress Notes (Signed)
Good morning please let Tony Fowler know labs have returned and overall they are remaining stable.  Prostate lab is in normal range for your age and ethnicity, plus has trended down.  Continue your current medication regimen.  Have a great day!!    I have also done letter which can be sent if he does not answer or requests this.

## 2020-01-03 ENCOUNTER — Ambulatory Visit (INDEPENDENT_AMBULATORY_CARE_PROVIDER_SITE_OTHER): Payer: Medicare Other

## 2020-01-03 VITALS — Ht 66.0 in | Wt 163.0 lb

## 2020-01-03 DIAGNOSIS — Z0000 Encounter for general adult medical examination without abnormal findings: Secondary | ICD-10-CM

## 2020-01-03 NOTE — Progress Notes (Signed)
I connected with Tony Fowler today by telephone and verified that I am speaking with the correct person using two identifiers. Location patient: home Location provider: work Persons participating in the virtual visit: Tony Fowler, [name] LPN.   I discussed the limitations, risks, security and privacy concerns of performing an evaluation and management service by telephone and the availability of in person appointments. I also discussed with the patient that there may be a patient responsible charge related to this service. The patient expressed understanding and verbally consented to this telephonic visit.    Interactive audio and video telecommunications were attempted between this provider and patient, however failed, due to patient having technical difficulties OR patient did not have access to video capability.  We continued and completed visit with audio only.     Vital signs may be patient reported or missing.  Subjective:   Tony Fowler is a 74 y.o. male who presents for Medicare Annual/Subsequent preventive examination.  Review of Systems     Cardiac Risk Factors include: none;advanced age (>24men, >90 women);dyslipidemia;hypertension;male gender;sedentary lifestyle     Objective:    Today's Vitals   01/03/20 0856 01/03/20 0857  Weight: 163 lb (73.9 kg)   Height: 5\' 6"  (1.676 m)   PainSc:  2    Body mass index is 26.31 kg/m.  Advanced Directives 01/03/2020 12/23/2018 06/25/2018 06/13/2018 06/13/2018 06/25/2017 04/20/2016  Does Patient Have a Medical Advance Directive? Yes Yes No No No No No  Type of 04/22/2016 of [place];Living will Living will;Healthcare Power of Attorney - - - - -  Copy of Healthcare Power of Attorney in Chart? No - copy requested No - copy requested - - - - -  Would patient like information on creating a medical advance directive? - - - Yes (Inpatient - patient requests chaplain consult to create a medical advance  directive) No - Patient declined Yes (MAU/Ambulatory/Procedural Areas - Information given) -  Some encounter information is confidential and restricted. Go to Review Flowsheets activity to see all data.    Current Medications (verified) Outpatient Encounter Medications as of 01/03/2020  Medication Sig  . ARIPiprazole (ABILIFY) 5 MG tablet Take by mouth.   01/05/2020 aspirin EC 81 MG tablet Take 81 mg by mouth daily.  . carbamazepine (TEGRETOL) 200 MG tablet Take 200 mg by mouth 3 (three) times daily.  Marland Kitchen gabapentin (NEURONTIN) 300 MG capsule Take 600 mg by mouth 3 (three) times daily.   . Multiple Vitamin (MULTIVITAMIN) tablet Take 1 tablet by mouth daily.  Marland Kitchen omeprazole (PRILOSEC) 40 MG capsule Take 1 capsule (40 mg total) by mouth daily.  . propranolol (INDERAL) 10 MG tablet Take 1 tablet (10 mg total) by mouth 3 (three) times daily.  . rosuvastatin (CRESTOR) 20 MG tablet Take 1 tablet (20 mg total) by mouth daily.  . tamsulosin (FLOMAX) 0.4 MG CAPS capsule Take 1 capsule (0.4 mg total) by mouth daily.   No facility-administered encounter medications on file as of 01/03/2020.    Allergies (verified) Penicillins   History: Past Medical History:  Diagnosis Date  . Bipolar 1 disorder (HCC)   . Coarse tremors   . Hypertension    Past Surgical History:  Procedure Laterality Date  . TONSILECTOMY/ADENOIDECTOMY WITH MYRINGOTOMY     Family History  Problem Relation Age of Onset  . Heart disease Mother   . Hypertension Mother   . Mental illness Mother   . Heart disease Brother   . Hypertension Brother  Social History   Socioeconomic History  . Marital status: Divorced    Spouse name: Not on file  . Number of children: Not on file  . Years of education: Not on file  . Highest education level: Not on file  Occupational History  . Not on file  Tobacco Use  . Smoking status: Former Smoker    Packs/day: 0.50    Years: 0.00    Pack years: 0.00    Types: Cigarettes    Quit date:  06/13/2018    Years since quitting: 1.5  . Smokeless tobacco: Never Used  Vaping Use  . Vaping Use: Never used  Substance and Sexual Activity  . Alcohol use: No    Alcohol/week: 0.0 standard drinks    Comment: 1 beer occasionally   . Drug use: No  . Sexual activity: Not on file  Other Topics Concern  . Not on file  Social History Narrative  . Not on file   Social Determinants of Health   Financial Resource Strain: Low Risk   . Difficulty of Paying Living Expenses: Not hard at all  Food Insecurity: No Food Insecurity  . Worried About Programme researcher, broadcasting/film/video in the Last Year: Never true  . Ran Out of Food in the Last Year: Never true  Transportation Needs: No Transportation Needs  . Lack of Transportation (Medical): No  . Lack of Transportation (Non-Medical): No  Physical Activity: Inactive  . Days of Exercise per Week: 0 days  . Minutes of Exercise per Session: 0 min  Stress: No Stress Concern Present  . Feeling of Stress : Not at all  Social Connections:   . Frequency of Communication with Friends and Family: Not on file  . Frequency of Social Gatherings with Friends and Family: Not on file  . Attends Religious Services: Not on file  . Active Member of Clubs or Organizations: Not on file  . Attends Banker Meetings: Not on file  . Marital Status: Not on file    Tobacco Counseling Counseling given: Not Answered   Clinical Intake:  Pre-visit preparation completed: Yes  Pain : 0-10 Pain Score: 2  Pain Type: Chronic pain Pain Location: Knee Pain Orientation: Left Pain Descriptors / Indicators: Aching Pain Onset: More than a month ago Pain Frequency: Intermittent     Nutritional Status: BMI 25 -29 Overweight Nutritional Risks: None  How often do you need to have someone help you when you read instructions, pamphlets, or other written materials from your doctor or pharmacy?: 1 - Never What is the last grade level you completed in school?: 53yr graduate  school  Diabetic? no  Interpreter Needed?: No  Information entered by :: NAllen LPN   Activities of Daily Living In your present state of health, do you have any difficulty performing the following activities: 01/03/2020  Hearing? N  Vision? N  Difficulty concentrating or making decisions? N  Walking or climbing stairs? N  Dressing or bathing? N  Doing errands, shopping? N  Preparing Food and eating ? N  Using the Toilet? N  In the past six months, have you accidently leaked urine? N  Do you have problems with loss of bowel control? N  Managing your Medications? N  Managing your Finances? N  Housekeeping or managing your Housekeeping? N  Some recent data might be hidden    Patient Care Team: Steele Sizer, MD as PCP - General (Family Medicine) Minor, Theadora Rama, RN (Inactive) as [employer] manager  Management  Indicate any recent Medical Services you may have received from other than [facility] providers in the past year (date may be approximate).     Assessment:   This is a routine wellness examination for Summer.  Hearing/Vision screen  Hearing Screening   125Hz  250Hz  500Hz  1000Hz  2000Hz  3000Hz  4000Hz  6000Hz  8000Hz   Right ear:           Left ear:           Vision Screening Comments: No regular eye exams  Dietary issues and exercise activities discussed: Current Exercise Habits: The patient does not participate in regular exercise at present  Goals    . Patient Stated     01/03/2020, wants to live as long as he can    . Quit Smoking     Smoking cessation discussed      Depression Screen PHQ 2/9 Scores 01/03/2020 12/23/2018 06/25/2018 06/25/2017 01/02/2017 02/22/2015  PHQ - 2 Score 0 0 0 0 2 0  PHQ- 9 Score - - - - 2 -    Fall Risk Fall Risk  01/03/2020 12/23/2018 06/25/2017 01/02/2017 07/02/2016  Falls in the past year? 0 0 No Yes Yes  Number falls in past yr: - - - 1 2 or more  Injury with Fall? - - - No No  Risk for fall due to : Impaired  balance/gait;Medication side effect - - - -  Follow up Falls evaluation completed;Education provided;Falls prevention discussed - - - -    Any stairs in or around the home? Yes  If so, are there any without handrails? No  Home free of loose throw rugs in walkways, pet beds, electrical cords, etc? Yes  Adequate lighting in your home to reduce risk of falls? Yes   ASSISTIVE DEVICES UTILIZED TO PREVENT FALLS:  Life alert? No  Use of a cane, walker or w/c? Yes  Grab bars in the bathroom? Yes  Shower chair or bench in shower? Yes  Elevated toilet seat or a handicapped toilet? No   TIMED UP AND GO:  Was the test performed? No .    Cognitive Function:     6CIT Screen 01/03/2020 12/23/2018 06/25/2017  What Year? 0 points 0 points 0 points  What month? 0 points 0 points 0 points  What time? 0 points 0 points 0 points  Count back from 20 0 points 0 points 0 points  Months in reverse 0 points 0 points 0 points  Repeat phrase 2 points 0 points 0 points  Total Score 2 0 0    Immunizations Immunization History  Administered Date(s) Administered  . Fluad Quad(high Dose 65+) 12/23/2018, 12/08/2019  . Influenza, High Dose Seasonal PF 05/16/2016, 01/02/2017, 01/08/2018  . Influenza,inj,Quad PF,6+ Mos 02/22/2015  . Influenza-Unspecified 02/16/2014, 02/22/2015  . PFIZER SARS-COV-2 Vaccination 05/21/2019, 06/11/2019  . PPD Test 06/18/2018  . Pneumococcal Conjugate-13 02/22/2015  . Pneumococcal-Unspecified 01/15/2012  . Td 12/14/2012    TDAP status: Up to date Flu Vaccine status: Up to date Pneumococcal vaccine status: Up to date Covid-19 vaccine status: Completed vaccines  Qualifies for Shingles Vaccine? Yes   Zostavax completed No   Shingrix Completed?: No.    Education has been provided regarding the importance of this vaccine. Patient has been advised to call insurance company to determine out of pocket expense if they have not yet received this vaccine. Advised may also receive  vaccine at local pharmacy or Health Dept. Verbalized acceptance and understanding.  Screening Tests Health Maintenance  Topic Date  Due  . COLON CANCER SCREENING ANNUAL FOBT  07/02/2020 (Originally [birth year]10/1997)  . COLONOSCOPY  01/02/2021 (Originally [birth year]10/1997)  . TETANUS/TDAP  12/15/2022  . INFLUENZA VACCINE  Completed  . COVID-19 Vaccine  Completed  . Hepatitis C Screening  Completed  . PNA vac Low Risk Adult  Completed    Health Maintenance  There are no preventive care reminders to display for this patient.  Colorectal cancer screening: decline  Lung Cancer Screening: (Low Dose CT Chest recommended if Age 7-80 years, 30 pack-year currently smoking OR have quit w/in 15years.) does not qualify.   Lung Cancer Screening Referral: no  Additional Screening:  Hepatitis C Screening: does qualify; Completed 05/16/2016  Vision Screening: Recommended annual ophthalmology exams for early detection of glaucoma and other disorders of the eye. Is the patient up to date with their annual eye exam?  No  Who is the provider or what is the name of the office in which the patient attends annual eye exams? none If pt is not established with a provider, would they like to be referred to a provider to establish care? No .   Dental Screening: Recommended annual dental exams for proper oral hygiene  Community Resource Referral / Chronic Care Management: CRR required this visit?  No   CCM required this visit?  No      Plan:     I have personally reviewed and noted the following in the patient's chart:   . Medical and social history . Use of alcohol, tobacco or illicit drugs  . Current medications and supplements . Functional ability and status . Nutritional status . Physical activity . Advanced directives . List of other physicians . Hospitalizations, surgeries, and ER visits in previous 12 months . Vitals . Screenings to include cognitive, depression, and falls . Referrals and  appointments  In addition, I have reviewed and discussed with patient certain preventive protocols, quality metrics, and best practice recommendations. A written personalized care plan for preventive services as well as general preventive health recommendations were provided to patient.     Barb Merinoickeah E Alessander Sikorski, LPN   1/61/09609/27/2021   Nurse Notes:

## 2020-01-03 NOTE — Patient Instructions (Signed)
Tony Fowler , Thank you for taking time to come for your Medicare Wellness Visit. I appreciate your ongoing commitment to your health goals. Please review the following plan we discussed and let me know if I can assist you in the future.   Screening recommendations/referrals: Colonoscopy: declines Recommended yearly ophthalmology/optometry visit for glaucoma screening and checkup Recommended yearly dental visit for hygiene and checkup  Vaccinations: Influenza vaccine: completed 12/08/2019 Pneumococcal vaccine: completed 02/22/2015 Tdap vaccine: completed 12/14/2012 Shingles vaccine: discussed   Covid-19: completed 06/11/2019, 05/21/2019  Advanced directives: Please bring a copy of your POA (Power of Attorney) and/or Living Will to your next appointment.   Conditions/risks identified: none  Next appointment: Follow up in one year for your annual wellness visit.   Preventive Care 22 Years and Older, Male Preventive care refers to lifestyle choices and visits with your health care provider that can promote health and wellness. What does preventive care include?  A yearly physical exam. This is also called an annual well check.  Dental exams once or twice a year.  Routine eye exams. Ask your health care provider how often you should have your eyes checked.  Personal lifestyle choices, including:  Daily care of your teeth and gums.  Regular physical activity.  Eating a healthy diet.  Avoiding tobacco and drug use.  Limiting alcohol use.  Practicing safe sex.  Taking low doses of aspirin every day.  Taking vitamin and mineral supplements as recommended by your health care provider. What happens during an annual well check? The services and screenings done by your health care provider during your annual well check will depend on your age, overall health, lifestyle risk factors, and family history of disease. Counseling  Your health care provider may ask you questions about  your:  Alcohol use.  Tobacco use.  Drug use.  Emotional well-being.  Home and relationship well-being.  Sexual activity.  Eating habits.  History of falls.  Memory and ability to understand (cognition).  Work and work Astronomer. Screening  You may have the following tests or measurements:  Height, weight, and BMI.  Blood pressure.  Lipid and cholesterol levels. These may be checked every 5 years, or more frequently if you are over 72 years old.  Skin check.  Lung cancer screening. You may have this screening every year starting at age 62 if you have a 30-pack-year history of smoking and currently smoke or have quit within the past 15 years.  Fecal occult blood test (FOBT) of the stool. You may have this test every year starting at age 94.  Flexible sigmoidoscopy or colonoscopy. You may have a sigmoidoscopy every 5 years or a colonoscopy every 10 years starting at age 45.  Prostate cancer screening. Recommendations will vary depending on your family history and other risks.  Hepatitis C blood test.  Hepatitis B blood test.  Sexually transmitted disease (STD) testing.  Diabetes screening. This is done by checking your blood sugar (glucose) after you have not eaten for a while (fasting). You may have this done every 1-3 years.  Abdominal aortic aneurysm (AAA) screening. You may need this if you are a current or former smoker.  Osteoporosis. You may be screened starting at age 20 if you are at high risk. Talk with your health care provider about your test results, treatment options, and if necessary, the need for more tests. Vaccines  Your health care provider may recommend certain vaccines, such as:  Influenza vaccine. This is recommended every year.  Tetanus,  diphtheria, and acellular pertussis (Tdap, Td) vaccine. You may need a Td booster every 10 years.  Zoster vaccine. You may need this after age 28.  Pneumococcal 13-valent conjugate (PCV13) vaccine.  One dose is recommended after age 43.  Pneumococcal polysaccharide (PPSV23) vaccine. One dose is recommended after age 21. Talk to your health care provider about which screenings and vaccines you need and how often you need them. This information is not intended to replace advice given to you by your health care provider. Make sure you discuss any questions you have with your health care provider. Document Released: 04/21/2015 Document Revised: 12/13/2015 Document Reviewed: 01/24/2015 Elsevier Interactive Patient Education  2017 [employer] Prevention in the Home Falls can cause injuries. They can happen to people of all ages. There are many things you can do to make your home safe and to help prevent falls. What can I do on the outside of my home?  Regularly fix the edges of walkways and driveways and fix any cracks.  Remove anything that might make you trip as you walk through a door, such as a raised step or threshold.  Trim any bushes or trees on the path to your home.  Use bright outdoor lighting.  Clear any walking paths of anything that might make someone trip, such as rocks or tools.  Regularly check to see if handrails are loose or broken. Make sure that both sides of any steps have handrails.  Any raised decks and porches should have guardrails on the edges.  Have any leaves, snow, or ice cleared regularly.  Use sand or salt on walking paths during winter.  Clean up any spills in your garage right away. This includes oil or grease spills. What can I do in the bathroom?  Use night lights.  Install grab bars by the toilet and in the tub and shower. Do not use towel bars as grab bars.  Use non-skid mats or decals in the tub or shower.  If you need to sit down in the shower, use a plastic, non-slip stool.  Keep the floor dry. Clean up any water that spills on the floor as soon as it happens.  Remove soap buildup in the tub or shower regularly.  Attach bath  mats securely with double-sided non-slip rug tape.  Do not have throw rugs and other things on the floor that can make you trip. What can I do in the bedroom?  Use night lights.  Make sure that you have a light by your bed that is easy to reach.  Do not use any sheets or blankets that are too big for your bed. They should not hang down onto the floor.  Have a firm chair that has side arms. You can use this for support while you get dressed.  Do not have throw rugs and other things on the floor that can make you trip. What can I do in the kitchen?  Clean up any spills right away.  Avoid walking on wet floors.  Keep items that you use a lot in easy-to-reach places.  If you need to reach something above you, use a strong step stool that has a grab bar.  Keep electrical cords out of the way.  Do not use floor polish or wax that makes floors slippery. If you must use wax, use non-skid floor wax.  Do not have throw rugs and other things on the floor that can make you trip. What can I do with  my stairs?  Do not leave any items on the stairs.  Make sure that there are handrails on both sides of the stairs and use them. Fix handrails that are broken or loose. Make sure that handrails are as long as the stairways.  Check any carpeting to make sure that it is firmly attached to the stairs. Fix any carpet that is loose or worn.  Avoid having throw rugs at the top or bottom of the stairs. If you do have throw rugs, attach them to the floor with carpet tape.  Make sure that you have a light switch at the top of the stairs and the bottom of the stairs. If you do not have them, ask someone to add them for you. What else can I do to help prevent falls?  Wear shoes that:  Do not have high heels.  Have rubber bottoms.  Are comfortable and fit you well.  Are closed at the toe. Do not wear sandals.  If you use a stepladder:  Make sure that it is fully opened. Do not climb a closed  stepladder.  Make sure that both sides of the stepladder are locked into place.  Ask someone to hold it for you, if possible.  Clearly mark and make sure that you can see:  Any grab bars or handrails.  First and last steps.  Where the edge of each step is.  Use tools that help you move around (mobility aids) if they are needed. These include:  Canes.  Walkers.  Scooters.  Crutches.  Turn on the lights when you go into a dark area. Replace any light bulbs as soon as they burn out.  Set up your furniture so you have a clear path. Avoid moving your furniture around.  If any of your floors are uneven, fix them.  If there are any pets around you, be aware of where they are.  Review your medicines with your doctor. Some medicines can make you feel dizzy. This can increase your chance of falling. Ask your doctor what other things that you can do to help prevent falls. This information is not intended to replace advice given to you by your health care provider. Make sure you discuss any questions you have with your health care provider. Document Released: 01/19/2009 Document Revised: 08/31/2015 Document Reviewed: 04/29/2014 Elsevier Interactive Patient Education  2017 [employer].

## 2020-01-24 DIAGNOSIS — Z23 Encounter for immunization: Secondary | ICD-10-CM | POA: Diagnosis not present

## 2020-02-16 DIAGNOSIS — F3113 Bipolar disorder, current episode manic without psychotic features, severe: Secondary | ICD-10-CM | POA: Diagnosis not present

## 2020-05-17 DIAGNOSIS — F3113 Bipolar disorder, current episode manic without psychotic features, severe: Secondary | ICD-10-CM | POA: Diagnosis not present

## 2020-06-07 ENCOUNTER — Ambulatory Visit (INDEPENDENT_AMBULATORY_CARE_PROVIDER_SITE_OTHER): Payer: Medicare Other | Admitting: Nurse Practitioner

## 2020-06-07 ENCOUNTER — Encounter: Payer: Self-pay | Admitting: Nurse Practitioner

## 2020-06-07 ENCOUNTER — Other Ambulatory Visit: Payer: Self-pay

## 2020-06-07 VITALS — BP 138/80 | HR 66 | Temp 97.8°F | Wt 167.2 lb

## 2020-06-07 DIAGNOSIS — K219 Gastro-esophageal reflux disease without esophagitis: Secondary | ICD-10-CM | POA: Diagnosis not present

## 2020-06-07 DIAGNOSIS — N138 Other obstructive and reflux uropathy: Secondary | ICD-10-CM | POA: Diagnosis not present

## 2020-06-07 DIAGNOSIS — E782 Mixed hyperlipidemia: Secondary | ICD-10-CM

## 2020-06-07 DIAGNOSIS — F312 Bipolar disorder, current episode manic severe with psychotic features: Secondary | ICD-10-CM

## 2020-06-07 DIAGNOSIS — I10 Essential (primary) hypertension: Secondary | ICD-10-CM

## 2020-06-07 DIAGNOSIS — N401 Enlarged prostate with lower urinary tract symptoms: Secondary | ICD-10-CM | POA: Diagnosis not present

## 2020-06-07 DIAGNOSIS — F17210 Nicotine dependence, cigarettes, uncomplicated: Secondary | ICD-10-CM

## 2020-06-07 DIAGNOSIS — R251 Tremor, unspecified: Secondary | ICD-10-CM | POA: Diagnosis not present

## 2020-06-07 NOTE — Assessment & Plan Note (Signed)
Chronic, stable.  Will continue Propranolol, refills sent in last visit, and continue collaboration with neurology.  Recent note reviewed.

## 2020-06-07 NOTE — Assessment & Plan Note (Signed)
Chronic, stable with Protonix.  Continue current regimen and consider trial reduction in future, if tolerated would consider discontinuation.  Mag level next visit.  Return in 6 months.

## 2020-06-07 NOTE — Assessment & Plan Note (Signed)
Chronic, ongoing.  Initial BP elevated, but repeat improving.  Home BP's average at goal for age -- has known white coat syndrome.  Have recommended adding on a low dose of Losartan, would avoid ACE as suspect some underlying COPD.  He refuses starting new medication today.  Will continue Propranolol for BP and tremor.  Recommend he monitor BP at home daily and document for provider visits + focus on DASH diet.  CMP today.  Refills sent in.  Return in 6 months.

## 2020-06-07 NOTE — Patient Instructions (Signed)

## 2020-06-07 NOTE — Assessment & Plan Note (Signed)
Chronic, stable.  Continue collaboration with psychiatry and regimen as prescribed by them.

## 2020-06-07 NOTE — Assessment & Plan Note (Signed)
Chronic, stable with no symptoms at this time.  Last PSA 4.2 in September 2021 -- normal for age and ethnicity.  Continue current medication regimen.  If elevation in PSA above goal for age 75.2 or greater, then consider urology referral.

## 2020-06-07 NOTE — Assessment & Plan Note (Signed)
Chronic, ongoing.  Continue current medication regimen and adjust as needed.  CMP and lipid panel today.  Refills sent in last visit.  Return in 6 months.

## 2020-06-07 NOTE — Progress Notes (Signed)
BP 138/80   Pulse 66   Temp 97.8 F (36.6 C) (Oral)   Wt 167 lb 3.2 oz (75.8 kg)   SpO2 96%   BMI 26.99 kg/m    Subjective:    Patient ID: Tony CassisHerbert Mcnulty, male    DOB: 07/23/1945, 75 y.o.   MRN: [id number]  HPI: Tony Fowler is a 75 y.o. male  Chief Complaint  Patient presents with  . Hypertension    Patient denies having any questions or concerns at today's visit.   HYPERTENSION / HYPERLIPIDEMIA Continues on Propranolol 10 MG TID (for tremor and BP) and ASA + Crestor 20 MG daily.  Quit smoking on March 7th, 2020 due to pneumonia and hospitalization.  He had smoked since 75 years old, 1 PPD per day.  Declines lung CT cancer screening. CKD 3a noted on recent labs with GFR 57.  Continues on Tamsulosin for BPH.  Recent PSA trend down an in normal range for his age and ethnicity. Satisfied with current treatment? yes Duration of hypertension: chronic BP monitoring frequency: daily BP range: average over 2 months was 135/88 BP medication side effects: no Duration of hyperlipidemia: chronic Cholesterol medication side effects: no Cholesterol supplements: none Medication compliance: good compliance Aspirin: yes Recent stressors: no Recurrent headaches: no Visual changes: no Palpitations: no Dyspnea: no Chest pain: no Lower extremity edema: no Dizzy/lightheaded: no  GERD Continues on Prilosec 40 MG daily. GERD control status: controlled  Satisfied with current treatment? yes Heartburn frequency: none Antacid use frequency:  none Dysphagia: no Odynophagia:  no Hematemesis: no Blood in stool: no EGD: no  BIPOLAR AFFECTIVE DISORDER: Is followed by psychiatry and last visit February 2022. Continues on Abilify 5 MG daily and Tegretol 200 MG TID.  He is also being followed by neurology for drug-induced tremor, [facility] with plan to continue Gabapentin 300 MG QID and Propranolol 10 MG TID, he declined physical therapy at the recent visit  with them.    Relevant past medical, surgical, family and social history reviewed and updated as indicated. Interim medical history since our last visit reviewed. Allergies and medications reviewed and updated.  Review of Systems  Constitutional: Negative for activity change, diaphoresis, fatigue and fever.  Respiratory: Negative for cough, chest tightness, shortness of breath and wheezing.   Cardiovascular: Negative for chest pain, palpitations and leg swelling.  Gastrointestinal: Negative.   Endocrine: Negative.   Neurological: Negative.   Psychiatric/Behavioral: Negative.     Per HPI unless specifically indicated above     Objective:    BP 138/80   Pulse 66   Temp 97.8 F (36.6 C) (Oral)   Wt 167 lb 3.2 oz (75.8 kg)   SpO2 96%   BMI 26.99 kg/m   Wt Readings from Last 3 Encounters:  06/07/20 167 lb 3.2 oz (75.8 kg)  01/03/20 163 lb (73.9 kg)  12/08/19 163 lb (73.9 kg)    Physical Exam Vitals and nursing note reviewed.  Constitutional:      General: He is awake. He is not in acute distress.    Appearance: He is well-developed, well-groomed and overweight. He is not ill-appearing.  HENT:     Head: Normocephalic and atraumatic.     Right Ear: Hearing normal. No drainage.     Left Ear: Hearing normal. No drainage.  Eyes:     General: Lids are normal.        Right eye: No discharge.        Left  eye: No discharge.     Conjunctiva/sclera: Conjunctivae normal.     Pupils: Pupils are equal, round, and reactive to light.  Neck:     Thyroid: No thyromegaly.     Vascular: No carotid bruit.     Trachea: Trachea normal.  Cardiovascular:     Rate and Rhythm: Normal rate and regular rhythm.     Heart sounds: Normal heart sounds, S1 normal and S2 normal. No murmur heard. No gallop.   Pulmonary:     Effort: Pulmonary effort is normal. No accessory muscle usage or respiratory distress.     Breath sounds: Normal breath sounds.  Abdominal:     General: Bowel sounds are  normal.     Palpations: Abdomen is soft.  Musculoskeletal:        General: Normal range of motion.     Cervical back: Normal range of motion and neck supple.     Right lower leg: No edema.     Left lower leg: No edema.  Skin:    General: Skin is warm and dry.     Capillary Refill: Capillary refill takes less than 2 seconds.     Findings: No rash.  Neurological:     Mental Status: He is alert and oriented to person, place, and time.     Deep Tendon Reflexes: Reflexes are normal and symmetric.  Psychiatric:        Attention and Perception: Attention normal.        Mood and Affect: Mood normal.        Speech: Speech normal.        Behavior: Behavior normal. Behavior is cooperative.        Thought Content: Thought content normal.     Results for orders placed or performed in visit on 12/08/19  Lipid Panel w/o Chol/HDL Ratio  Result Value Ref Range   Cholesterol, Total 190 100 - 199 mg/dL   Triglycerides 161 (H) 0 - 149 mg/dL   HDL 54 >09 mg/dL   VLDL Cholesterol Cal 30 5 - 40 mg/dL   LDL Chol Calc (NIH) 604 (H) 0 - 99 mg/dL  Comprehensive metabolic panel  Result Value Ref Range   Glucose 89 65 - 99 mg/dL   BUN 20 8 - 27 mg/dL   Creatinine, Ser 5.40 0.76 - 1.27 mg/dL   GFR calc non Af Amer 57 (L) >59 mL/min/1.73   GFR calc Af Amer 65 >59 mL/min/1.73   BUN/Creatinine Ratio 16 10 - 24   Sodium 138 134 - 144 mmol/L   Potassium 4.5 3.5 - 5.2 mmol/L   Chloride 104 96 - 106 mmol/L   CO2 24 20 - 29 mmol/L   Calcium 9.7 8.6 - 10.2 mg/dL   Total Protein 6.7 6.0 - 8.5 g/dL   Albumin 4.3 3.7 - 4.7 g/dL   Globulin, Total 2.4 1.5 - 4.5 g/dL   Albumin/Globulin Ratio 1.8 1.2 - 2.2   Bilirubin Total 0.4 0.0 - 1.2 mg/dL   Alkaline Phosphatase 127 (H) 48 - 121 IU/L   AST 23 0 - 40 IU/L   ALT 19 0 - 44 IU/L  TSH  Result Value Ref Range   TSH 1.180 0.450 - 4.500 uIU/mL  CBC with Differential/Platelet  Result Value Ref Range   WBC 7.1 3.4 - 10.8 x10E3/uL   RBC 4.70 4.14 - 5.80  x10E6/uL   Hemoglobin 15.0 13.0 - 17.7 g/dL   Hematocrit 98.1 19.1 - 51.0 %   MCV 93 79 -  97 fL   MCH 31.9 26.6 - 33.0 pg   MCHC 34.4 31.5 - 35.7 g/dL   RDW 16.9 45.0 - 38.8 %   Platelets 228 150 - 450 x10E3/uL   Neutrophils 68 Not Estab. %   Lymphs 21 Not Estab. %   Monocytes 8 Not Estab. %   Eos 2 Not Estab. %   Basos 1 Not Estab. %   Neutrophils Absolute 4.8 1.4 - 7.0 x10E3/uL   Lymphocytes Absolute 1.5 0.7 - 3.1 x10E3/uL   Monocytes Absolute 0.6 0.1 - 0.9 x10E3/uL   EOS (ABSOLUTE) 0.1 0.0 - 0.4 x10E3/uL   Basophils Absolute 0.0 0.0 - 0.2 x10E3/uL   Immature Granulocytes 0 Not Estab. %   Immature Grans (Abs) 0.0 0.0 - 0.1 x10E3/uL  PSA  Result Value Ref Range   Prostate Specific Ag, Serum 4.2 (H) 0.0 - 4.0 ng/mL  Microalbumin, Urine Waived  Result Value Ref Range   Microalb, Ur Waived 10 0 - 19 mg/L   Creatinine, Urine Waived 50 10 - 300 mg/dL   Microalb/Creat Ratio 30-300 (H) <30 mg/g  Magnesium  Result Value Ref Range   Magnesium 2.0 1.6 - 2.3 mg/dL      Assessment & Plan:   Problem List Items Addressed This Visit      Cardiovascular and Mediastinum   Benign hypertension (Chronic)    Chronic, ongoing.  Initial BP elevated, but repeat improving.  Home BP's average at goal for age -- has known white coat syndrome.  Have recommended adding on a low dose of Losartan, would avoid ACE as suspect some underlying COPD.  He refuses starting new medication today.  Will continue Propranolol for BP and tremor.  Recommend he monitor BP at home daily and document for provider visits + focus on DASH diet.  CMP today.  Refills sent in.  Return in 6 months.      Relevant Orders   Comprehensive metabolic panel     Digestive   GERD (gastroesophageal reflux disease)    Chronic, stable with Protonix.  Continue current regimen and consider trial reduction in future, if tolerated would consider discontinuation.  Mag level next visit.  Return in 6 months.        Genitourinary   BPH  with obstruction/lower urinary tract symptoms (Chronic)    Chronic, stable with no symptoms at this time.  Last PSA 4.2 in September 2021 -- normal for age and ethnicity.  Continue current medication regimen.  If elevation in PSA above goal for age 16.2 or greater, then consider urology referral.        Other   Bipolar affective disorder, manic, severe, with psychotic behavior (HCC) - Primary (Chronic)    Chronic, stable.  Continue collaboration with psychiatry and regimen as prescribed by them.       Nicotine dependence, cigarettes, uncomplicated (Chronic)    Quit in March 2020 after PNA.  Recommend continued cessation.  Discussed at length CT lung CA screening with him and provided pamphlet on this for review, recommend he obtain this screening.  He is not interested.  Suspect some underlying COPD present.      Tremor (Chronic)    Chronic, stable.  Will continue Propranolol, refills sent in last visit, and continue collaboration with neurology.  Recent note reviewed.      Mixed hyperlipidemia (Chronic)    Chronic, ongoing.  Continue current medication regimen and adjust as needed.  CMP and lipid panel today.  Refills sent in last visit.  Return  in 6 months.      Relevant Orders   Lipid Panel w/o Chol/HDL Ratio       Follow up plan: Return in about 6 months (around 12/08/2020) for HTN/HLD, BPH, MOOD -- meet Dr. Charlotta Newton.

## 2020-06-07 NOTE — Assessment & Plan Note (Signed)
Quit in March 2020 after PNA.  Recommend continued cessation.  Discussed at length CT lung CA screening with him and provided pamphlet on this for review, recommend he obtain this screening.  He is not interested.  Suspect some underlying COPD present. 

## 2020-06-08 ENCOUNTER — Encounter: Payer: Self-pay | Admitting: Nurse Practitioner

## 2020-06-08 LAB — COMPREHENSIVE METABOLIC PANEL
ALT: 21 IU/L (ref 0–44)
AST: 27 IU/L (ref 0–40)
Albumin/Globulin Ratio: 2 (ref 1.2–2.2)
Albumin: 4.3 g/dL (ref 3.7–4.7)
Alkaline Phosphatase: 107 IU/L (ref 44–121)
BUN/Creatinine Ratio: 14 (ref 10–24)
BUN: 20 mg/dL (ref 8–27)
Bilirubin Total: 0.3 mg/dL (ref 0.0–1.2)
CO2: 21 mmol/L (ref 20–29)
Calcium: 9.6 mg/dL (ref 8.6–10.2)
Chloride: 108 mmol/L — ABNORMAL HIGH (ref 96–106)
Creatinine, Ser: 1.39 mg/dL — ABNORMAL HIGH (ref 0.76–1.27)
Globulin, Total: 2.2 g/dL (ref 1.5–4.5)
Glucose: 92 mg/dL (ref 65–99)
Potassium: 4.7 mmol/L (ref 3.5–5.2)
Sodium: 143 mmol/L (ref 134–144)
Total Protein: 6.5 g/dL (ref 6.0–8.5)
eGFR: 54 mL/min/{1.73_m2} — ABNORMAL LOW (ref >59)

## 2020-06-08 LAB — LIPID PANEL W/O CHOL/HDL RATIO
Cholesterol, Total: 196 mg/dL (ref 100–199)
HDL: 53 mg/dL (ref >39)
LDL Chol Calc (NIH): 120 mg/dL — ABNORMAL HIGH (ref 0–99)
Triglycerides: 131 mg/dL (ref 0–149)
VLDL Cholesterol Cal: 23 mg/dL (ref 5–40)

## 2020-06-08 NOTE — Progress Notes (Signed)
Good morning, please let Parke know his labs have returned.  He continues to show some mild kidney disease on labs, but no significant decline.  We will continue to monitor this and ensure no worsening.  You may benefit adding on another blood pressure medication in future that offers kidney protection.  Your cholesterol levels are slightly more elevated then I would like with LDL at 120, would like to see less then 70 for stroke prevention.  You would benefit from increasing your Rosuvastatin to 40 MG, are you okay with this change?  If so I will send in.  Thank you. Keep being awesome!!  Thank you for allowing me to participate in your care. Kindest regards, Jolene

## 2020-06-09 ENCOUNTER — Other Ambulatory Visit: Payer: Self-pay | Admitting: Nurse Practitioner

## 2020-06-09 MED ORDER — ROSUVASTATIN CALCIUM 40 MG PO TABS: 40.0000 mg | ORAL_TABLET | Freq: Every day | ORAL | 4 refills | Status: DC

## 2020-07-11 DIAGNOSIS — Z23 Encounter for immunization: Secondary | ICD-10-CM | POA: Diagnosis not present

## 2020-07-26 DIAGNOSIS — F3113 Bipolar disorder, current episode manic without psychotic features, severe: Secondary | ICD-10-CM | POA: Diagnosis not present

## 2020-09-28 DIAGNOSIS — G251 Drug-induced tremor: Secondary | ICD-10-CM | POA: Diagnosis not present

## 2020-10-12 DIAGNOSIS — F3113 Bipolar disorder, current episode manic without psychotic features, severe: Secondary | ICD-10-CM | POA: Diagnosis not present

## 2020-10-20 DIAGNOSIS — Z20822 Contact with and (suspected) exposure to covid-19: Secondary | ICD-10-CM | POA: Diagnosis not present

## 2020-12-05 ENCOUNTER — Ambulatory Visit: Payer: Medicare Other | Admitting: Nurse Practitioner

## 2020-12-05 ENCOUNTER — Telehealth: Payer: Self-pay

## 2020-12-05 DIAGNOSIS — N401 Enlarged prostate with lower urinary tract symptoms: Secondary | ICD-10-CM

## 2020-12-05 DIAGNOSIS — I10 Essential (primary) hypertension: Secondary | ICD-10-CM

## 2020-12-05 DIAGNOSIS — K219 Gastro-esophageal reflux disease without esophagitis: Secondary | ICD-10-CM

## 2020-12-05 DIAGNOSIS — N138 Other obstructive and reflux uropathy: Secondary | ICD-10-CM

## 2020-12-05 DIAGNOSIS — R251 Tremor, unspecified: Secondary | ICD-10-CM

## 2020-12-05 NOTE — Telephone Encounter (Signed)
Patient needs refills on Prilosec, Flomax, Crestor and propanolol.  His appointment had to be rescheduled due to provider being out.

## 2020-12-06 ENCOUNTER — Other Ambulatory Visit: Payer: Self-pay | Admitting: Nurse Practitioner

## 2020-12-06 DIAGNOSIS — N138 Other obstructive and reflux uropathy: Secondary | ICD-10-CM

## 2020-12-06 DIAGNOSIS — I10 Essential (primary) hypertension: Secondary | ICD-10-CM

## 2020-12-06 DIAGNOSIS — K219 Gastro-esophageal reflux disease without esophagitis: Secondary | ICD-10-CM

## 2020-12-06 DIAGNOSIS — R251 Tremor, unspecified: Secondary | ICD-10-CM

## 2020-12-06 MED ORDER — PROPRANOLOL HCL 10 MG PO TABS
10.0000 mg | ORAL_TABLET | Freq: Three times a day (TID) | ORAL | 13 refills | Status: DC
Start: 2020-12-06 — End: 2021-09-26

## 2020-12-06 MED ORDER — ROSUVASTATIN CALCIUM 40 MG PO TABS: 40.0000 mg | ORAL_TABLET | Freq: Every day | ORAL | 4 refills | Status: DC

## 2020-12-06 MED ORDER — OMEPRAZOLE 40 MG PO CPDR: 40.0000 mg | DELAYED_RELEASE_CAPSULE | Freq: Every day | ORAL | 4 refills | Status: DC

## 2020-12-06 MED ORDER — TAMSULOSIN HCL 0.4 MG PO CAPS: 0.4000 mg | ORAL_CAPSULE | Freq: Every day | ORAL | 4 refills | Status: DC

## 2020-12-08 ENCOUNTER — Ambulatory Visit: Payer: Medicare Other | Admitting: Internal Medicine

## 2020-12-20 IMAGING — MR MRI HEAD WITHOUT AND WITH CONTRAST
11 of 12 series · 45 of 48 positions shown · IV contrast (gadavist)
Comparison: Brain MRI 07/08/2016

CLINICAL DATA: Vertigo

EXAM:
MRI HEAD WITHOUT AND WITH CONTRAST
TECHNIQUE: Multiplanar, multiecho pulse sequences of the brain and surrounding
structures were obtained without and with intravenous contrast.
CONTRAST:  6 mL Gadavist

[Series 2: ax dwi_tracew · axial · 3.0mm · 0.83mm/px · z∈[-60,+97]mm · 6 of 55 slices shown]
[im 1/55]
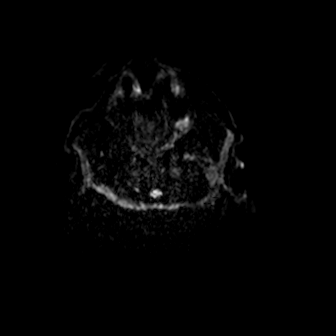
[im 11/55]
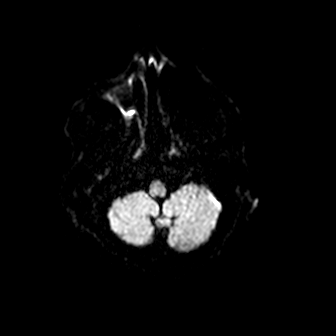
[im 22/55]
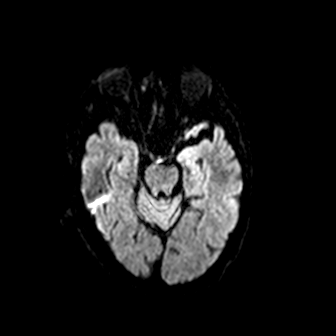
[im 33/55]
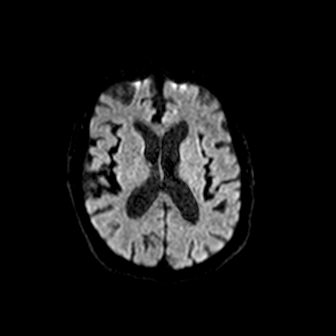
[im 44/55]
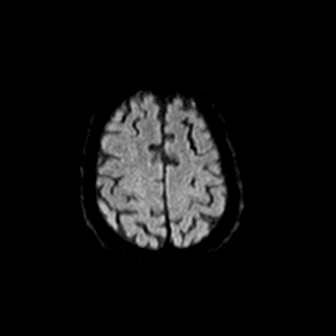
[im 55/55]
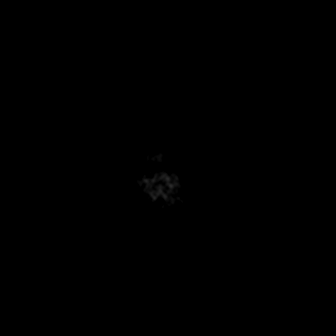

[Series 3: ax dwi_adc · axial · 3.0mm · 0.83mm/px · z∈[-60,+97]mm · 6 of 54 slices shown]
[im 1/54]
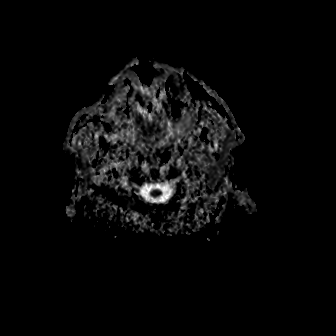
[im 11/54]
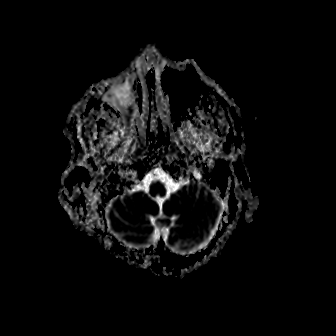
[im 22/54]
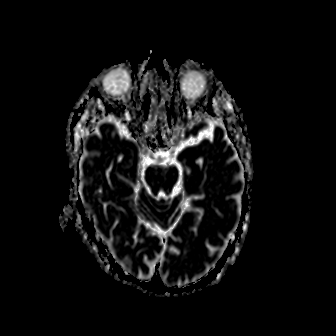
[im 32/54]
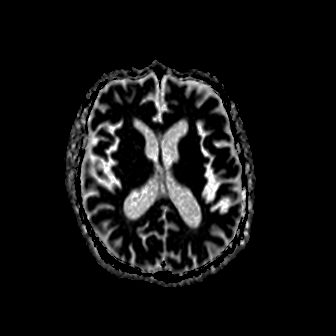
[im 43/54]
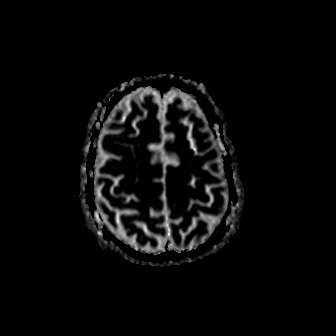
[im 54/54]
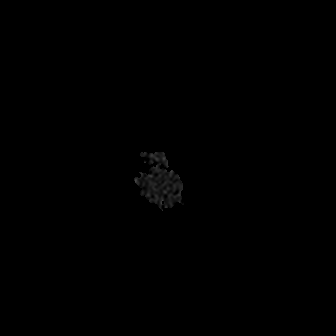

[Series 4: cor dwi_tracew · coronal · 5.0mm · 0.68mm/px · 5 of 45 slices shown]
[im 1/45]
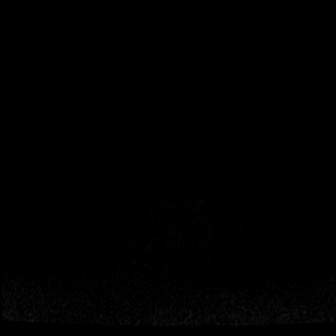
[im 12/45]
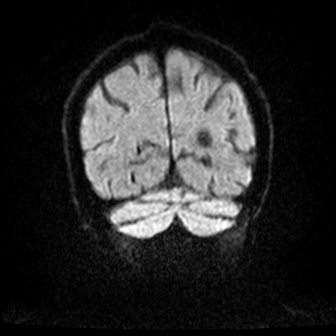
[im 23/45]
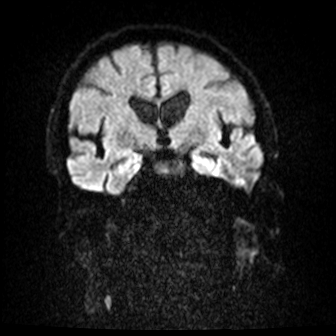
[im 34/45]
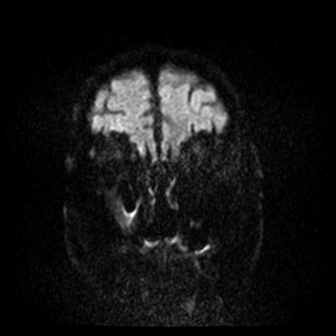
[im 45/45]
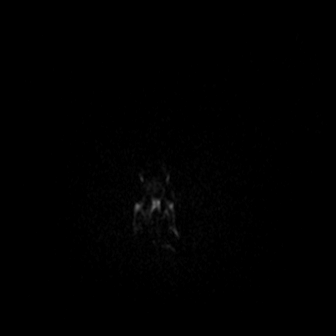

[Series 5: cor dwi_adc · coronal · 5.0mm · 0.68mm/px · 5 of 40 slices shown]
[im 1/40]
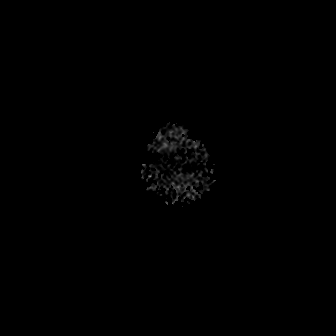
[im 10/40]
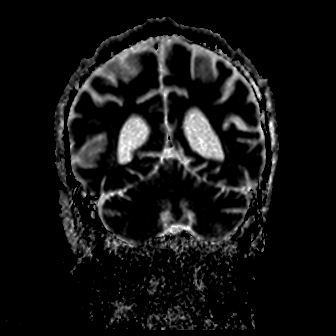
[im 20/40]
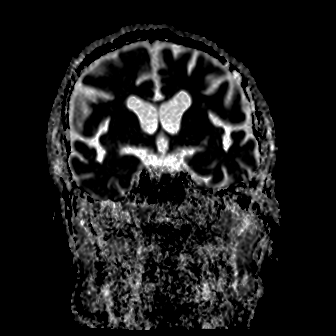
[im 30/40]
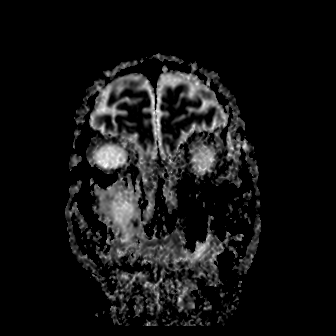
[im 40/40]
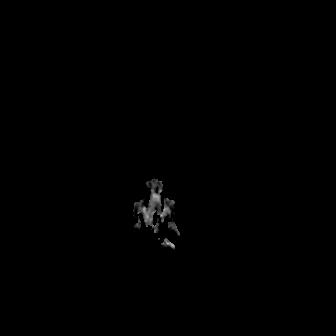

[Series 6: T1 · sagittal · 5.0mm · 0.94mm/px · 3 of 25 slices shown (1 of 2)]
[im 1/25]
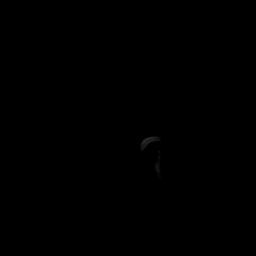
[im 13/25]
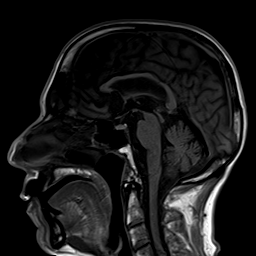
[im 25/25]
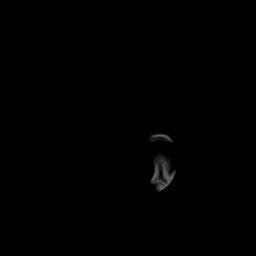

[Series 7: T2 · axial · 5.0mm · 0.45mm/px · z∈[-43,+109]mm · 3 of 24 slices shown (1 of 2)]
[im 1/24]
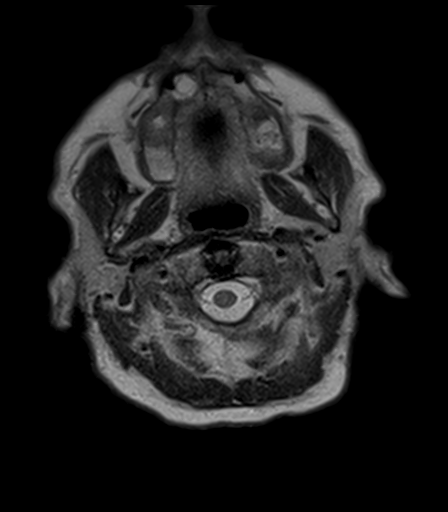
[im 12/24]
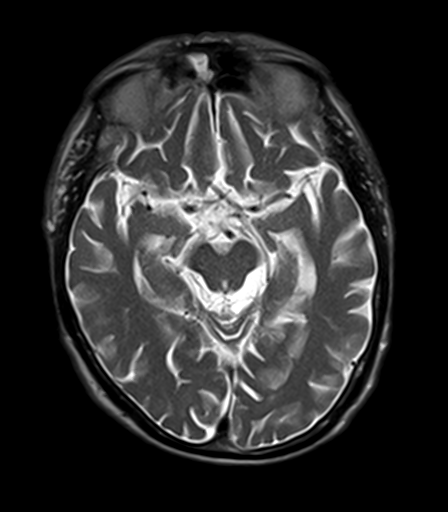
[im 24/24]
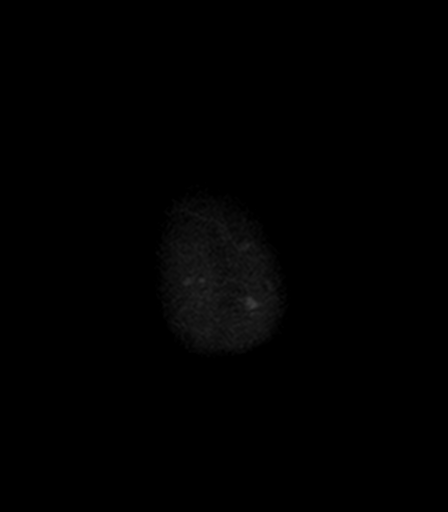

[Series 9: FLAIR · axial · 5.0mm · 1.20mm/px · z∈[-44,+108]mm · 3 of 27 slices shown]
[im 1/27]
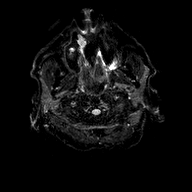
[im 14/27]
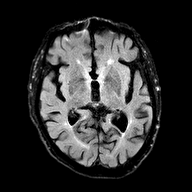
[im 27/27]
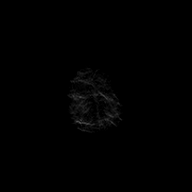

[Series 10: T1 · axial · 5.0mm · 0.90mm/px · z∈[-43,+109]mm · 3 of 27 slices shown (2 of 2)]
[im 1/27]
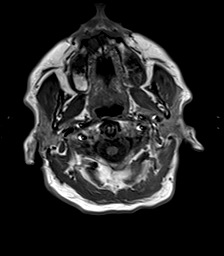
[im 14/27]
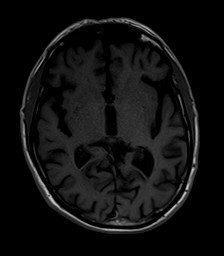
[im 27/27]
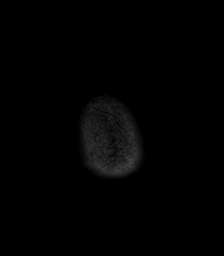

[Series 11: T2 · coronal · 5.0mm · 0.45mm/px · 4 of 31 slices shown (2 of 2)]
[im 1/31]
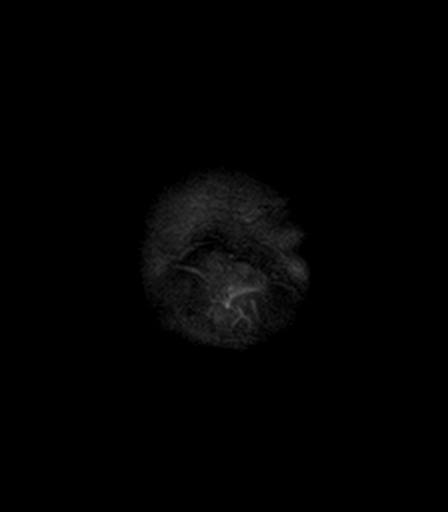
[im 11/31]
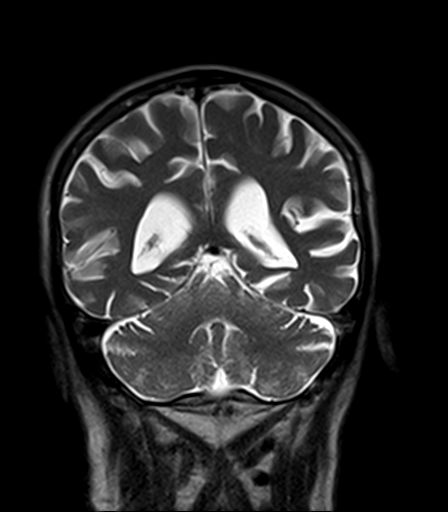
[im 21/31]
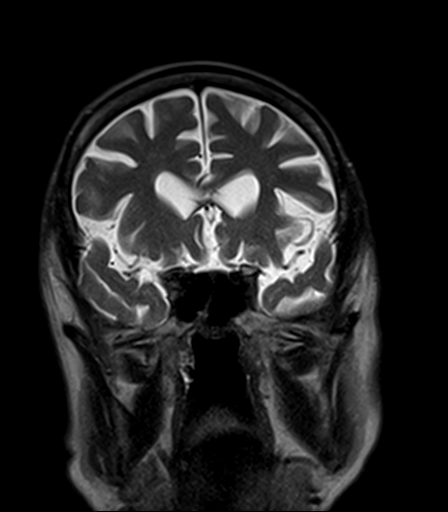
[im 31/31]
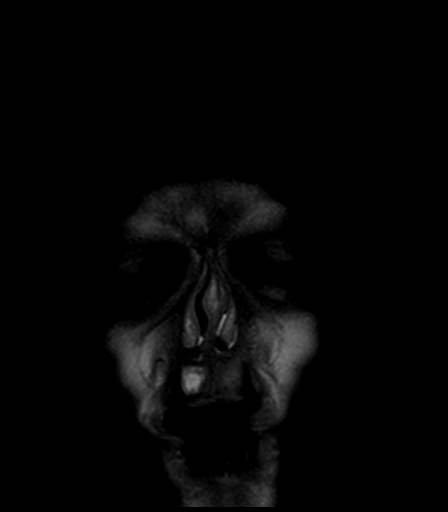

[Series 12: T1 post-contrast · axial · 5.0mm · 0.90mm/px · z∈[-43,+109]mm · 3 of 27 slices shown (1 of 2)]
[im 1/27]
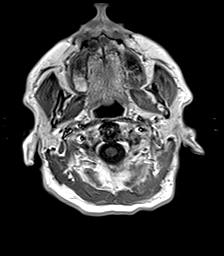
[im 14/27]
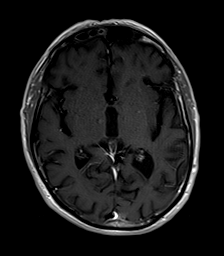
[im 27/27]
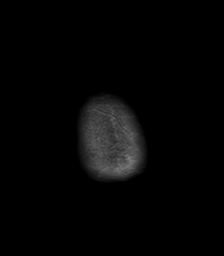

[Series 13: T1 post-contrast · coronal · 5.0mm · 0.90mm/px · 4 of 31 slices shown (2 of 2)]
[im 1/31]
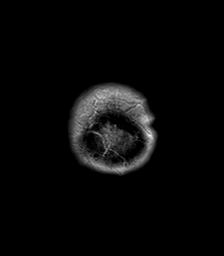
[im 11/31]
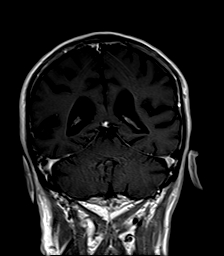
[im 21/31]
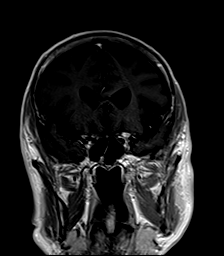
[im 31/31]
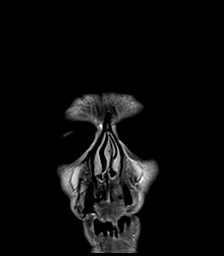

[45 of 48 positions shown; findings below may reference images not displayed]

FINDINGS: BRAIN: There is no acute infarct, acute hemorrhage, hydrocephalus or
extra-axial collection. The midline structures are normal. No
midline shift or other mass effect. Multifocal white matter
hyperintensity, most commonly due to chronic ischemic
microangiopathy. Generalized atrophy without lobar predilection.
Susceptibility-sensitive sequences show no chronic microhemorrhage
or superficial siderosis.

VASCULAR: Major intracranial arterial and venous sinus flow voids
are normal.

SKULL AND UPPER CERVICAL SPINE: Calvarial bone marrow signal is
normal. There is no skull base mass. Visualized upper cervical spine
and soft tissues are normal.

SINUSES/ORBITS: Moderate right maxillary sinus mucosal thickening.
Small bilateral mastoid effusions. The orbits are normal.
IMPRESSION: 1. No acute intracranial abnormality.
2. Generalized atrophy and chronic microvascular ischemia.

## 2020-12-25 ENCOUNTER — Ambulatory Visit (INDEPENDENT_AMBULATORY_CARE_PROVIDER_SITE_OTHER): Payer: Medicare Other | Admitting: Internal Medicine

## 2020-12-25 ENCOUNTER — Encounter: Payer: Self-pay | Admitting: Internal Medicine

## 2020-12-25 ENCOUNTER — Other Ambulatory Visit: Payer: Self-pay

## 2020-12-25 VITALS — BP 172/100 | HR 65 | Temp 98.5°F | Ht 66.1 in | Wt 163.2 lb

## 2020-12-25 DIAGNOSIS — F319 Bipolar disorder, unspecified: Secondary | ICD-10-CM | POA: Insufficient documentation

## 2020-12-25 DIAGNOSIS — Z23 Encounter for immunization: Secondary | ICD-10-CM | POA: Diagnosis not present

## 2020-12-25 DIAGNOSIS — Z1329 Encounter for screening for other suspected endocrine disorder: Secondary | ICD-10-CM | POA: Diagnosis not present

## 2020-12-25 DIAGNOSIS — I10 Essential (primary) hypertension: Secondary | ICD-10-CM | POA: Insufficient documentation

## 2020-12-25 DIAGNOSIS — R972 Elevated prostate specific antigen [PSA]: Secondary | ICD-10-CM | POA: Diagnosis not present

## 2020-12-25 DIAGNOSIS — Z136 Encounter for screening for cardiovascular disorders: Secondary | ICD-10-CM | POA: Diagnosis not present

## 2020-12-25 DIAGNOSIS — Z125 Encounter for screening for malignant neoplasm of prostate: Secondary | ICD-10-CM

## 2020-12-25 LAB — URINALYSIS, ROUTINE W REFLEX MICROSCOPIC
Bilirubin, UA: NEGATIVE
Glucose, UA: NEGATIVE
Ketones, UA: NEGATIVE
Leukocytes,UA: NEGATIVE
Nitrite, UA: NEGATIVE
Protein,UA: NEGATIVE
RBC, UA: NEGATIVE
Specific Gravity, UA: 1.01 (ref 1.005–1.030)
Urobilinogen, Ur: 0.2 mg/dL (ref 0.2–1.0)
pH, UA: 5.5 (ref 5.0–7.5)

## 2020-12-25 MED ORDER — PROPRANOLOL HCL 10 MG PO TABS: 10.0000 mg | ORAL_TABLET | Freq: Once | ORAL | 3 refills | Status: DC | PRN

## 2020-12-25 NOTE — Progress Notes (Signed)
BP (!) 172/100 (BP Location: Left Arm, Cuff Size: Normal)   Pulse 65   Temp 98.5 F (36.9 C) (Oral)   Ht 5' 6.1" (1.679 m)   Wt 163 lb 3.2 oz (74 kg)   SpO2 94%   BMI 26.26 kg/m    Subjective:    Patient ID: Tony Fowler, male    DOB: Jan 24, 1946, 75 y.o.   MRN: [id number]  Chief Complaint  Patient presents with   Hyperlipidemia   Hypertension    HPI: Tony Fowler is a 75 y.o. male  Pt is here to establush care. 134/ 86 mm hg @ home, is on propranolol for such  And gabapentin for Essential tremors.   Hyperlipidemia This is a chronic problem. The problem is controlled. He has no history of chronic renal disease, diabetes, hypothyroidism, liver disease, obesity or nephrotic syndrome. Pertinent negatives include no chest pain or shortness of breath.  Hypertension This is a chronic problem. The current episode started more than 1 year ago. The problem is unchanged. Pertinent negatives include no anxiety, blurred vision, chest pain, malaise/fatigue, peripheral edema or shortness of breath. There is no history of chronic renal disease.   Chief Complaint  Patient presents with   Hyperlipidemia   Hypertension    Relevant past medical, surgical, family and social history reviewed and updated as indicated. Interim medical history since our last visit reviewed. Allergies and medications reviewed and updated.  Review of Systems  Constitutional:  Negative for malaise/fatigue.  Eyes:  Negative for blurred vision.  Respiratory:  Negative for shortness of breath.   Cardiovascular:  Negative for chest pain.   Per HPI unless specifically indicated above     Objective:    BP (!) 172/100 (BP Location: Left Arm, Cuff Size: Normal)   Pulse 65   Temp 98.5 F (36.9 C) (Oral)   Ht 5' 6.1" (1.679 m)   Wt 163 lb 3.2 oz (74 kg)   SpO2 94%   BMI 26.26 kg/m   Wt Readings from Last 3 Encounters:  12/25/20 163 lb 3.2 oz (74 kg)  06/07/20 167 lb 3.2 oz (75.8 kg)  01/03/20 163 lb  (73.9 kg)    Physical Exam Vitals and nursing note reviewed.  Constitutional:      General: He is not in acute distress.    Appearance: Normal appearance. He is not ill-appearing or diaphoretic.  HENT:     Head: Normocephalic and atraumatic.     Right Ear: Tympanic membrane and external ear normal. There is no impacted cerumen.     Left Ear: External ear normal.     Nose: No congestion or rhinorrhea.     Mouth/Throat:     Pharynx: No oropharyngeal exudate or posterior oropharyngeal erythema.  Eyes:     Conjunctiva/sclera: Conjunctivae normal.     Pupils: Pupils are equal, round, and reactive to light.  Cardiovascular:     Rate and Rhythm: Normal rate and regular rhythm.     Heart sounds: No murmur heard.   No friction rub. No gallop.  Pulmonary:     Effort: No respiratory distress.     Breath sounds: No stridor. No wheezing or rhonchi.  Chest:     Chest wall: No tenderness.  Abdominal:     General: Abdomen is flat. Bowel sounds are normal. There is no distension.     Palpations: Abdomen is soft. There is no mass.     Tenderness: There is no abdominal tenderness. There is no guarding.  Musculoskeletal:  General: No swelling or deformity.     Cervical back: Normal range of motion and neck supple. No rigidity or tenderness.     Right lower leg: No edema.     Left lower leg: No edema.  Skin:    General: Skin is warm and dry.     Coloration: Skin is not jaundiced.     Findings: No erythema.  Neurological:     Mental Status: He is alert and oriented to person, place, and time. Mental status is at baseline.  Psychiatric:        Mood and Affect: Mood normal.        Behavior: Behavior normal.        Thought Content: Thought content normal.        Judgment: Judgment normal.    Results for orders placed or performed in visit on 06/07/20  Comprehensive metabolic panel  Result Value Ref Range   Glucose 92 65 - 99 mg/dL   BUN 20 8 - 27 mg/dL   Creatinine, Ser 1.39 (H)  0.76 - 1.27 mg/dL   eGFR 54 (L) >59 mL/min/1.73   BUN/Creatinine Ratio 14 10 - 24   Sodium 143 134 - 144 mmol/L   Potassium 4.7 3.5 - 5.2 mmol/L   Chloride 108 (H) 96 - 106 mmol/L   CO2 21 20 - 29 mmol/L   Calcium 9.6 8.6 - 10.2 mg/dL   Total Protein 6.5 6.0 - 8.5 g/dL   Albumin 4.3 3.7 - 4.7 g/dL   Globulin, Total 2.2 1.5 - 4.5 g/dL   Albumin/Globulin Ratio 2.0 1.2 - 2.2   Bilirubin Total 0.3 0.0 - 1.2 mg/dL   Alkaline Phosphatase 107 44 - 121 IU/L   AST 27 0 - 40 IU/L   ALT 21 0 - 44 IU/L  Lipid Panel w/o Chol/HDL Ratio  Result Value Ref Range   Cholesterol, Total 196 100 - 199 mg/dL   Triglycerides 131 0 - 149 mg/dL   HDL 53 >39 mg/dL   VLDL Cholesterol Cal 23 5 - 40 mg/dL   LDL Chol Calc (NIH) 120 (H) 0 - 99 mg/dL        Current Outpatient Medications:    ARIPiprazole (ABILIFY) 5 MG tablet, Take 5 mg by mouth daily., Disp: , Rfl:    aspirin EC 81 MG tablet, Take 81 mg by mouth daily., Disp: , Rfl:    carbamazepine (TEGRETOL) 200 MG tablet, Take 200 mg by mouth 3 (three) times daily., Disp: , Rfl:    gabapentin (NEURONTIN) 300 MG capsule, Take 1 capsule by mouth in the morning, at noon, in the evening, and at bedtime., Disp: , Rfl:    Multiple Vitamin (MULTIVITAMIN) tablet, Take 1 tablet by mouth daily., Disp: , Rfl:    omeprazole (PRILOSEC) 40 MG capsule, Take 1 capsule (40 mg total) by mouth daily., Disp: 90 capsule, Rfl: 4   propranolol (INDERAL) 10 MG tablet, Take 1 tablet (10 mg total) by mouth 3 (three) times daily., Disp: 90 tablet, Rfl: 13   rosuvastatin (CRESTOR) 40 MG tablet, Take 1 tablet (40 mg total) by mouth daily., Disp: 90 tablet, Rfl: 4   tamsulosin (FLOMAX) 0.4 MG CAPS capsule, Take 1 capsule (0.4 mg total) by mouth daily., Disp: 90 capsule, Rfl: 4    Assessment & Plan:  BPD sees psych for such is on abilify continue such   2. HTN extra dose of inderal sent to pharmacy for additional days when he has to drive.  Continue current  meds.  Medication  compliance emphasised. pt advised to keep Bp logs. Pt verbalised understanding of the same. Pt to have a low salt diet . Exercise to reach a goal of at least 150 mins a week.  lifestyle modifications explained and pt understands importance of the above.  3. GERD  " Stable, is on  Prilosec GERD continue pantoprazole 40 mg q.day as prescribed patient advised to avoid laying down soon after his meals. He took a 2 hours between dinner and bedtime. Avoid spicy food and triggers that he knows food wise that worsen his acid reflux. Patient verbalized understanding of the above. Lifestyle modifications as above discussed with patient.   4. BPH is on flomax for such   Problem List Items Addressed This Visit   None Visit Diagnoses     Need for influenza vaccination    -  Primary   Relevant Orders   Flu Vaccine QUAD High Dose(Fluad) (Completed)        Orders Placed This Encounter  Procedures   Flu Vaccine QUAD High Dose(Fluad)     No orders of the defined types were placed in this encounter.    Follow up plan: No follow-ups on file.

## 2020-12-26 LAB — CBC WITH DIFFERENTIAL/PLATELET
Basophils Absolute: 0 10*3/uL (ref 0.0–0.2)
Basos: 1 %
EOS (ABSOLUTE): 0.1 10*3/uL (ref 0.0–0.4)
Eos: 2 %
Hematocrit: 43.8 % (ref 37.5–51.0)
Hemoglobin: 14.6 g/dL (ref 13.0–17.7)
Immature Grans (Abs): 0 10*3/uL (ref 0.0–0.1)
Immature Granulocytes: 0 %
Lymphocytes Absolute: 2.1 10*3/uL (ref 0.7–3.1)
Lymphs: 27 %
MCH: 31.1 pg (ref 26.6–33.0)
MCHC: 33.3 g/dL (ref 31.5–35.7)
MCV: 93 fL (ref 79–97)
Monocytes Absolute: 0.5 10*3/uL (ref 0.1–0.9)
Monocytes: 6 %
Neutrophils Absolute: 4.9 10*3/uL (ref 1.4–7.0)
Neutrophils: 64 %
Platelets: 231 10*3/uL (ref 150–450)
RBC: 4.7 x10E6/uL (ref 4.14–5.80)
RDW: 12.9 % (ref 11.6–15.4)
WBC: 7.6 10*3/uL (ref 3.4–10.8)

## 2020-12-26 LAB — COMPREHENSIVE METABOLIC PANEL
ALT: 28 IU/L (ref 0–44)
AST: 31 IU/L (ref 0–40)
Albumin/Globulin Ratio: 2 (ref 1.2–2.2)
Albumin: 4.4 g/dL (ref 3.7–4.7)
Alkaline Phosphatase: 111 IU/L (ref 44–121)
BUN/Creatinine Ratio: 15 (ref 10–24)
BUN: 19 mg/dL (ref 8–27)
Bilirubin Total: 0.3 mg/dL (ref 0.0–1.2)
CO2: 23 mmol/L (ref 20–29)
Calcium: 9.6 mg/dL (ref 8.6–10.2)
Chloride: 104 mmol/L (ref 96–106)
Creatinine, Ser: 1.31 mg/dL — ABNORMAL HIGH (ref 0.76–1.27)
Globulin, Total: 2.2 g/dL (ref 1.5–4.5)
Glucose: 95 mg/dL (ref 65–99)
Potassium: 4.8 mmol/L (ref 3.5–5.2)
Sodium: 140 mmol/L (ref 134–144)
Total Protein: 6.6 g/dL (ref 6.0–8.5)
eGFR: 57 mL/min/{1.73_m2} — ABNORMAL LOW (ref >59)

## 2020-12-26 LAB — PSA: Prostate Specific Ag, Serum: 4.3 ng/mL — ABNORMAL HIGH (ref 0.0–4.0)

## 2020-12-26 LAB — TSH: TSH: 1.14 u[IU]/mL (ref 0.450–4.500)

## 2020-12-26 NOTE — Progress Notes (Signed)
Please let pt know his creatinine was improving and so also the gfr.  PSA is still the same stable. No new wu / meds needed.  Thnx.

## 2020-12-29 NOTE — Addendum Note (Signed)
Addended by: Pablo Ledger on: 12/29/2020 05:52 PM   Modules accepted: Orders

## 2021-01-03 ENCOUNTER — Ambulatory Visit: Payer: Medicare Other

## 2021-01-05 ENCOUNTER — Ambulatory Visit (INDEPENDENT_AMBULATORY_CARE_PROVIDER_SITE_OTHER): Payer: Medicare Other

## 2021-01-05 VITALS — Ht 67.0 in | Wt 163.0 lb

## 2021-01-05 DIAGNOSIS — Z0000 Encounter for general adult medical examination without abnormal findings: Secondary | ICD-10-CM | POA: Diagnosis not present

## 2021-01-05 NOTE — Patient Instructions (Signed)
Tony Fowler , Thank you for taking time to come for your Medicare Wellness Visit. I appreciate your ongoing commitment to your health goals. Please review the following plan we discussed and let me know if I can assist you in the future.   Screening recommendations/referrals: Colonoscopy: not required Recommended yearly ophthalmology/optometry visit for glaucoma screening and checkup Recommended yearly dental visit for hygiene and checkup  Vaccinations: Influenza vaccine: completed 12/25/2020 Pneumococcal vaccine: completed 02/22/2015 Tdap vaccine: completed 12/14/2012, due 12/15/2022 Shingles vaccine: discussed   Covid-19:  07/11/2020, 01/24/2020, 06/11/2019, 05/21/2019  Advanced directives: Please bring a copy of your POA (Power of Attorney) and/or Living Will to your next appointment.   Conditions/risks identified: none  Next appointment: Follow up in one year for your annual wellness visit.   Preventive Care 18 Years and Older, Male Preventive care refers to lifestyle choices and visits with your health care provider that can promote health and wellness. What does preventive care include? A yearly physical exam. This is also called an annual well check. Dental exams once or twice a year. Routine eye exams. Ask your health care provider how often you should have your eyes checked. Personal lifestyle choices, including: Daily care of your teeth and gums. Regular physical activity. Eating a healthy diet. Avoiding tobacco and drug use. Limiting alcohol use. Practicing safe sex. Taking low doses of aspirin every day. Taking vitamin and mineral supplements as recommended by your health care provider. What happens during an annual well check? The services and screenings done by your health care provider during your annual well check will depend on your age, overall health, lifestyle risk factors, and family history of disease. Counseling  Your health care provider may ask you questions about  your: Alcohol use. Tobacco use. Drug use. Emotional well-being. Home and relationship well-being. Sexual activity. Eating habits. History of falls. Memory and ability to understand (cognition). Work and work Astronomer. Screening  You may have the following tests or measurements: Height, weight, and BMI. Blood pressure. Lipid and cholesterol levels. These may be checked every 5 years, or more frequently if you are over 32 years old. Skin check. Lung cancer screening. You may have this screening every year starting at age 29 if you have a 30-pack-year history of smoking and currently smoke or have quit within the past 15 years. Fecal occult blood test (FOBT) of the stool. You may have this test every year starting at age 63. Flexible sigmoidoscopy or colonoscopy. You may have a sigmoidoscopy every 5 years or a colonoscopy every 10 years starting at age 46. Prostate cancer screening. Recommendations will vary depending on your family history and other risks. Hepatitis C blood test. Hepatitis B blood test. Sexually transmitted disease (STD) testing. Diabetes screening. This is done by checking your blood sugar (glucose) after you have not eaten for a while (fasting). You may have this done every 1-3 years. Abdominal aortic aneurysm (AAA) screening. You may need this if you are a current or former smoker. Osteoporosis. You may be screened starting at age 6 if you are at high risk. Talk with your health care provider about your test results, treatment options, and if necessary, the need for more tests. Vaccines  Your health care provider may recommend certain vaccines, such as: Influenza vaccine. This is recommended every year. Tetanus, diphtheria, and acellular pertussis (Tdap, Td) vaccine. You may need a Td booster every 10 years. Zoster vaccine. You may need this after age 34. Pneumococcal 13-valent conjugate (PCV13) vaccine. One dose is  recommended after age 66. Pneumococcal  polysaccharide (PPSV23) vaccine. One dose is recommended after age 43. Talk to your health care provider about which screenings and vaccines you need and how often you need them. This information is not intended to replace advice given to you by your health care provider. Make sure you discuss any questions you have with your health care provider. Document Released: 04/21/2015 Document Revised: 12/13/2015 Document Reviewed: 01/24/2015 Elsevier Interactive Patient Education  2017 [place] Prevention in the Home Falls can cause injuries. They can happen to people of all ages. There are many things you can do to make your home safe and to help prevent falls. What can I do on the outside of my home? Regularly fix the edges of walkways and driveways and fix any cracks. Remove anything that might make you trip as you walk through a door, such as a raised step or threshold. Trim any bushes or trees on the path to your home. Use bright outdoor lighting. Clear any walking paths of anything that might make someone trip, such as rocks or tools. Regularly check to see if handrails are loose or broken. Make sure that both sides of any steps have handrails. Any raised decks and porches should have guardrails on the edges. Have any leaves, snow, or ice cleared regularly. Use sand or salt on walking paths during winter. Clean up any spills in your garage right away. This includes oil or grease spills. What can I do in the bathroom? Use night lights. Install grab bars by the toilet and in the tub and shower. Do not use towel bars as grab bars. Use non-skid mats or decals in the tub or shower. If you need to sit down in the shower, use a plastic, non-slip stool. Keep the floor dry. Clean up any water that spills on the floor as soon as it happens. Remove soap buildup in the tub or shower regularly. Attach bath mats securely with double-sided non-slip rug tape. Do not have throw rugs and other  things on the floor that can make you trip. What can I do in the bedroom? Use night lights. Make sure that you have a light by your bed that is easy to reach. Do not use any sheets or blankets that are too big for your bed. They should not hang down onto the floor. Have a firm chair that has side arms. You can use this for support while you get dressed. Do not have throw rugs and other things on the floor that can make you trip. What can I do in the kitchen? Clean up any spills right away. Avoid walking on wet floors. Keep items that you use a lot in easy-to-reach places. If you need to reach something above you, use a strong step stool that has a grab bar. Keep electrical cords out of the way. Do not use floor polish or wax that makes floors slippery. If you must use wax, use non-skid floor wax. Do not have throw rugs and other things on the floor that can make you trip. What can I do with my stairs? Do not leave any items on the stairs. Make sure that there are handrails on both sides of the stairs and use them. Fix handrails that are broken or loose. Make sure that handrails are as long as the stairways. Check any carpeting to make sure that it is firmly attached to the stairs. Fix any carpet that is loose or worn. Avoid having  throw rugs at the top or bottom of the stairs. If you do have throw rugs, attach them to the floor with carpet tape. Make sure that you have a light switch at the top of the stairs and the bottom of the stairs. If you do not have them, ask someone to add them for you. What else can I do to help prevent falls? Wear shoes that: Do not have high heels. Have rubber bottoms. Are comfortable and fit you well. Are closed at the toe. Do not wear sandals. If you use a stepladder: Make sure that it is fully opened. Do not climb a closed stepladder. Make sure that both sides of the stepladder are locked into place. Ask someone to hold it for you, if possible. Clearly  mark and make sure that you can see: Any grab bars or handrails. First and last steps. Where the edge of each step is. Use tools that help you move around (mobility aids) if they are needed. These include: Canes. Walkers. Scooters. Crutches. Turn on the lights when you go into a dark area. Replace any light bulbs as soon as they burn out. Set up your furniture so you have a clear path. Avoid moving your furniture around. If any of your floors are uneven, fix them. If there are any pets around you, be aware of where they are. Review your medicines with your doctor. Some medicines can make you feel dizzy. This can increase your chance of falling. Ask your doctor what other things that you can do to help prevent falls. This information is not intended to replace advice given to you by your health care provider. Make sure you discuss any questions you have with your health care provider. Document Released: 01/19/2009 Document Revised: 08/31/2015 Document Reviewed: 04/29/2014 Elsevier Interactive Patient Education  2017 [employer].

## 2021-01-05 NOTE — Progress Notes (Signed)
I connected with Tony Fowler today by telephone and verified that I am speaking with the correct person using two identifiers. Location patient: home Location provider: work Persons participating in the virtual visit: Tony Fowler, [name] LPN.   I discussed the limitations, risks, security and privacy concerns of performing an evaluation and management service by telephone and the availability of in person appointments. I also discussed with the patient that there may be a patient responsible charge related to this service. The patient expressed understanding and verbally consented to this telephonic visit.    Interactive audio and video telecommunications were attempted between this provider and patient, however failed, due to patient having technical difficulties OR patient did not have access to video capability.  We continued and completed visit with audio only.     Vital signs may be patient reported or missing.  Subjective:   Tony Fowler is a 75 y.o. male who presents for Medicare Annual/Subsequent preventive examination.  Review of Systems     Cardiac Risk Factors include: advanced age (>87men, >47 women);dyslipidemia;hypertension;male gender;sedentary lifestyle     Objective:    Today's Vitals   01/05/21 0856  Weight: 163 lb (73.9 kg)  Height: 5\' 7"  (1.702 m)   Body mass index is 25.53 kg/m.  Advanced Directives 01/05/2021 01/03/2020 12/23/2018 06/25/2018 06/13/2018 06/13/2018 06/25/2017  Does Patient Have a Medical Advance Directive? Yes Yes Yes No No No No  Type of 06/27/2017 of [place];Living will Healthcare Power of [place];Living will Living will;Healthcare Power of Attorney - - - -  Copy of Healthcare Power of Attorney in Chart? No - copy requested No - copy requested No - copy requested - - - -  Would patient like information on creating a medical advance directive? - - - - Yes (Inpatient - patient requests chaplain consult to create  a medical advance directive) No - Patient declined Yes (MAU/Ambulatory/Procedural Areas - Information given)  Some encounter information is confidential and restricted. Go to Review Flowsheets activity to see all data.    Current Medications (verified) Outpatient Encounter Medications as of 01/05/2021  Medication Sig   ARIPiprazole (ABILIFY) 5 MG tablet Take 5 mg by mouth daily.   aspirin EC 81 MG tablet Take 81 mg by mouth daily.   carbamazepine (TEGRETOL) 200 MG tablet Take 200 mg by mouth 3 (three) times daily.   gabapentin (NEURONTIN) 300 MG capsule Take 1 capsule by mouth in the morning, at noon, in the evening, and at bedtime.   Multiple Vitamin (MULTIVITAMIN) tablet Take 1 tablet by mouth daily.   omeprazole (PRILOSEC) 40 MG capsule Take 1 capsule (40 mg total) by mouth daily.   propranolol (INDERAL) 10 MG tablet Take 1 tablet (10 mg total) by mouth 3 (three) times daily.   propranolol (INDERAL) 10 MG tablet Take 1 tablet (10 mg total) by mouth Once PRN for up to 1 dose.   rosuvastatin (CRESTOR) 40 MG tablet Take 1 tablet (40 mg total) by mouth daily.   tamsulosin (FLOMAX) 0.4 MG CAPS capsule Take 1 capsule (0.4 mg total) by mouth daily.   No facility-administered encounter medications on file as of 01/05/2021.    Allergies (verified) Penicillins   History: Past Medical History:  Diagnosis Date   Bipolar 1 disorder (HCC)    Coarse tremors    Hypertension    Past Surgical History:  Procedure Laterality Date   TONSILECTOMY/ADENOIDECTOMY WITH MYRINGOTOMY     Family History  Problem Relation Age of Onset  Heart disease Mother    Hypertension Mother    Mental illness Mother    Heart disease Brother    Hypertension Brother    Social History   Socioeconomic History   Marital status: Divorced    Spouse name: Not on file   Number of children: Not on file   Years of education: Not on file   Highest education level: Not on file  Occupational History   Not on file   Tobacco Use   Smoking status: Former    Packs/day: 0.50    Years: 0.00    Pack years: 0.00    Types: Cigarettes    Quit date: 06/13/2018    Years since quitting: 2.5   Smokeless tobacco: Never  Vaping Use   Vaping Use: Never used  Substance and Sexual Activity   Alcohol use: No    Alcohol/week: 0.0 standard drinks    Comment: 1 beer occasionally    Drug use: No   Sexual activity: Not Currently  Other Topics Concern   Not on file  Social History Narrative   Not on file   Social Determinants of Health   Financial Resource Strain: Low Risk    Difficulty of Paying Living Expenses: Not hard at all  Food Insecurity: No Food Insecurity   Worried About Programme researcher, broadcasting/film/video in the Last Year: Never true   Ran Out of Food in the Last Year: Never true  Transportation Needs: No Transportation Needs   Lack of Transportation (Medical): No   Lack of Transportation (Non-Medical): No  Physical Activity: Inactive   Days of Exercise per Week: 0 days   Minutes of Exercise per Session: 0 min  Stress: No Stress Concern Present   Feeling of Stress : Not at all  Social Connections: Not on file    Tobacco Counseling Counseling given: Not Answered   Clinical Intake:  Pre-visit preparation completed: Yes  Pain : No/denies pain     Nutritional Status: BMI 25 -29 Overweight Nutritional Risks: None Diabetes: No  How often do you need to have someone help you when you read instructions, pamphlets, or other written materials from your doctor or pharmacy?: 1 - Never What is the last grade level you completed in school?: 28yr graduate  Diabetic? no  Interpreter Needed?: No  Information entered by :: NAllen LPN   Activities of Daily Living In your present state of health, do you have any difficulty performing the following activities: 01/05/2021  Hearing? N  Vision? N  Difficulty concentrating or making decisions? N  Walking or climbing stairs? N  Dressing or bathing? N  Doing  errands, shopping? N  Preparing Food and eating ? N  Using the Toilet? N  In the past six months, have you accidently leaked urine? N  Do you have problems with loss of bowel control? N  Managing your Medications? N  Managing your Finances? N  Housekeeping or managing your Housekeeping? N  Some recent data might be hidden    Patient Care Team: Loura Pardon, MD as PCP - [facility], Theadora Rama, RN (Inactive) as [facility] Therapist, music  Indicate any recent Medical Services you may have received from other than [facility] providers in the past year (date may be approximate).     Assessment:   This is a routine wellness examination for Tony Fowler.  Hearing/Vision screen Vision Screening - Comments:: No regular eye exams,  Dietary issues and exercise activities discussed: Current Exercise Habits: The patient does not  participate in regular exercise at present   Goals Addressed             This Visit's Progress    Patient Stated       01/05/2021, stay healthy       Depression Screen PHQ 2/9 Scores 01/05/2021 06/07/2020 01/03/2020 12/23/2018 06/25/2018 06/25/2017 01/02/2017  PHQ - 2 Score 0 0 0 0 0 0 2  PHQ- 9 Score - 0 - - - - 2    Fall Risk Fall Risk  01/05/2021 01/03/2020 12/23/2018 06/25/2017 01/02/2017  Falls in the past year? 0 0 0 No Yes  Number falls in past yr: - - - - 1  Injury with Fall? - - - - No  Risk for fall due to : Medication side effect Impaired balance/gait;Medication side effect - - -  Follow up Falls evaluation completed;Education provided;Falls prevention discussed Falls evaluation completed;Education provided;Falls prevention discussed - - -    FALL RISK PREVENTION PERTAINING TO THE HOME:  Any stairs in or around the home? Yes  If so, are there any without handrails? No  Home free of loose throw rugs in walkways, pet beds, electrical cords, etc? Yes  Adequate lighting in your home to reduce risk of falls? Yes   ASSISTIVE DEVICES UTILIZED TO  PREVENT FALLS:  Life alert? No  Use of a cane, walker or w/c? No  Grab bars in the bathroom? Yes  Shower chair or bench in shower? Yes  Elevated toilet seat or a handicapped toilet? No   TIMED UP AND GO:  Was the test performed? No .      Cognitive Function:     6CIT Screen 01/05/2021 01/03/2020 12/23/2018 06/25/2017  What Year? 0 points 0 points 0 points 0 points  What month? 0 points 0 points 0 points 0 points  What time? 0 points 0 points 0 points 0 points  Count back from 20 0 points 0 points 0 points 0 points  Months in reverse 0 points 0 points 0 points 0 points  Repeat phrase 2 points 2 points 0 points 0 points  Total Score 2 2 0 0    Immunizations Immunization History  Administered Date(s) Administered   Fluad Quad(high Dose 65+) 12/23/2018, 12/08/2019, 12/25/2020   Influenza Split 01/08/2008   Influenza, High Dose Seasonal PF 05/16/2016, 01/02/2017, 01/08/2018   Influenza,inj,Quad PF,6+ Mos 02/22/2015   Influenza-Unspecified 02/16/2014, 02/22/2015   PFIZER(Purple Top)SARS-COV-2 Vaccination 05/21/2019, 06/11/2019, 01/24/2020, 07/11/2020   PPD Test 06/18/2018   Pneumococcal Conjugate-13 02/22/2015   Pneumococcal-Unspecified 01/15/2012   Td 12/14/2012    TDAP status: Up to date  Flu Vaccine status: Up to date  Pneumococcal vaccine status: Up to date  Covid-19 vaccine status: Completed vaccines  Qualifies for Shingles Vaccine? Yes   Zostavax completed No   Shingrix Completed?: No.    Education has been provided regarding the importance of this vaccine. Patient has been advised to call insurance company to determine out of pocket expense if they have not yet received this vaccine. Advised may also receive vaccine at local pharmacy or Health Dept. Verbalized acceptance and understanding.  Screening Tests Health Maintenance  Topic Date Due   COVID-19 Vaccine (5 - Booster for Pfizer series) 11/10/2020   Zoster Vaccines- Shingrix (1 of 2) 03/26/2021 (Originally  01/10/1965)   COLON CANCER SCREENING ANNUAL FOBT  07/05/2021 (Originally 01/11/1992)   TETANUS/TDAP  12/15/2022   INFLUENZA VACCINE  Completed   Hepatitis C Screening  Completed   HPV VACCINES  Aged Out  COLONOSCOPY (Pts 45-7yrs Insurance coverage will need to be confirmed)  Discontinued    Health Maintenance  Health Maintenance Due  Topic Date Due   COVID-19 Vaccine (5 - Booster for Pfizer series) 11/10/2020    Colorectal cancer screening: No longer required.   Lung Cancer Screening: (Low Dose CT Chest recommended if Age 43-80 years, 30 pack-year currently smoking OR have quit w/in 15years.) does not qualify.   Lung Cancer Screening Referral: no  Additional Screening:  Hepatitis C Screening: does qualify; Completed 05/16/2016  Vision Screening: Recommended annual ophthalmology exams for early detection of glaucoma and other disorders of the eye. Is the patient up to date with their annual eye exam?  No  Who is the provider or what is the name of the office in which the patient attends annual eye exams? none If pt is not established with a provider, would they like to be referred to a provider to establish care? No .   Dental Screening: Recommended annual dental exams for proper oral hygiene  Community Resource Referral / Chronic Care Management: CRR required this visit?  No   CCM required this visit?  No      Plan:     I have personally reviewed and noted the following in the patient's chart:   Medical and social history Use of alcohol, tobacco or illicit drugs  Current medications and supplements including opioid prescriptions. Patient is not currently taking opioid prescriptions. Functional ability and status Nutritional status Physical activity Advanced directives List of other physicians Hospitalizations, surgeries, and ER visits in previous 12 months Vitals Screenings to include cognitive, depression, and falls Referrals and appointments  In addition, I  have reviewed and discussed with patient certain preventive protocols, quality metrics, and best practice recommendations. A written personalized care plan for preventive services as well as general preventive health recommendations were provided to patient.     Barb Merino, LPN   05/22/863   Nurse Notes:

## 2021-01-11 DIAGNOSIS — F3113 Bipolar disorder, current episode manic without psychotic features, severe: Secondary | ICD-10-CM | POA: Diagnosis not present

## 2021-03-14 ENCOUNTER — Other Ambulatory Visit: Payer: Self-pay

## 2021-03-14 ENCOUNTER — Other Ambulatory Visit: Payer: Medicare Other

## 2021-03-14 DIAGNOSIS — Z136 Encounter for screening for cardiovascular disorders: Secondary | ICD-10-CM | POA: Diagnosis not present

## 2021-03-14 DIAGNOSIS — Z1329 Encounter for screening for other suspected endocrine disorder: Secondary | ICD-10-CM | POA: Diagnosis not present

## 2021-03-14 DIAGNOSIS — Z125 Encounter for screening for malignant neoplasm of prostate: Secondary | ICD-10-CM

## 2021-03-14 DIAGNOSIS — R972 Elevated prostate specific antigen [PSA]: Secondary | ICD-10-CM

## 2021-03-14 DIAGNOSIS — I10 Essential (primary) hypertension: Secondary | ICD-10-CM

## 2021-03-15 DIAGNOSIS — Z20822 Contact with and (suspected) exposure to covid-19: Secondary | ICD-10-CM | POA: Diagnosis not present

## 2021-03-15 LAB — CBC WITH DIFFERENTIAL/PLATELET
Basophils Absolute: 0 10*3/uL (ref 0.0–0.2)
Basos: 1 %
EOS (ABSOLUTE): 0.3 10*3/uL (ref 0.0–0.4)
Eos: 4 %
Hematocrit: 44.3 % (ref 37.5–51.0)
Hemoglobin: 14.7 g/dL (ref 13.0–17.7)
Immature Grans (Abs): 0 10*3/uL (ref 0.0–0.1)
Immature Granulocytes: 0 %
Lymphocytes Absolute: 2 10*3/uL (ref 0.7–3.1)
Lymphs: 29 %
MCH: 30.7 pg (ref 26.6–33.0)
MCHC: 33.2 g/dL (ref 31.5–35.7)
MCV: 93 fL (ref 79–97)
Monocytes Absolute: 0.5 10*3/uL (ref 0.1–0.9)
Monocytes: 7 %
Neutrophils Absolute: 4.1 10*3/uL (ref 1.4–7.0)
Neutrophils: 59 %
Platelets: 240 10*3/uL (ref 150–450)
RBC: 4.79 x10E6/uL (ref 4.14–5.80)
RDW: 12.7 % (ref 11.6–15.4)
WBC: 6.9 10*3/uL (ref 3.4–10.8)

## 2021-03-15 LAB — COMPREHENSIVE METABOLIC PANEL
ALT: 26 IU/L (ref 0–44)
AST: 32 IU/L (ref 0–40)
Albumin/Globulin Ratio: 1.8 (ref 1.2–2.2)
Albumin: 4.3 g/dL (ref 3.7–4.7)
Alkaline Phosphatase: 114 IU/L (ref 44–121)
BUN/Creatinine Ratio: 13 (ref 10–24)
BUN: 19 mg/dL (ref 8–27)
Bilirubin Total: 0.2 mg/dL (ref 0.0–1.2)
CO2: 22 mmol/L (ref 20–29)
Calcium: 9.5 mg/dL (ref 8.6–10.2)
Chloride: 104 mmol/L (ref 96–106)
Creatinine, Ser: 1.49 mg/dL — ABNORMAL HIGH (ref 0.76–1.27)
Globulin, Total: 2.4 g/dL (ref 1.5–4.5)
Glucose: 108 mg/dL — ABNORMAL HIGH (ref 70–99)
Potassium: 4.7 mmol/L (ref 3.5–5.2)
Sodium: 140 mmol/L (ref 134–144)
Total Protein: 6.7 g/dL (ref 6.0–8.5)
eGFR: 49 mL/min/{1.73_m2} — ABNORMAL LOW (ref >59)

## 2021-03-15 LAB — LIPID PANEL
Chol/HDL Ratio: 3.5 ratio (ref 0.0–5.0)
Cholesterol, Total: 198 mg/dL (ref 100–199)
HDL: 56 mg/dL (ref >39)
LDL Chol Calc (NIH): 119 mg/dL — ABNORMAL HIGH (ref 0–99)
Triglycerides: 129 mg/dL (ref 0–149)
VLDL Cholesterol Cal: 23 mg/dL (ref 5–40)

## 2021-03-15 LAB — PSA: Prostate Specific Ag, Serum: 3.9 ng/mL (ref 0.0–4.0)

## 2021-03-15 LAB — TSH: TSH: 1.02 u[IU]/mL (ref 0.450–4.500)

## 2021-03-21 ENCOUNTER — Ambulatory Visit (INDEPENDENT_AMBULATORY_CARE_PROVIDER_SITE_OTHER): Payer: Medicare Other | Admitting: Internal Medicine

## 2021-03-21 ENCOUNTER — Encounter: Payer: Self-pay | Admitting: Internal Medicine

## 2021-03-21 ENCOUNTER — Other Ambulatory Visit: Payer: Self-pay

## 2021-03-21 VITALS — BP 158/80 | HR 76 | Temp 98.5°F | Ht 66.93 in | Wt 166.0 lb

## 2021-03-21 DIAGNOSIS — R972 Elevated prostate specific antigen [PSA]: Secondary | ICD-10-CM | POA: Diagnosis not present

## 2021-03-21 DIAGNOSIS — I10 Essential (primary) hypertension: Secondary | ICD-10-CM

## 2021-03-21 DIAGNOSIS — E782 Mixed hyperlipidemia: Secondary | ICD-10-CM

## 2021-03-21 MED ORDER — AMLODIPINE BESYLATE 2.5 MG PO TABS: 2.5000 mg | ORAL_TABLET | Freq: Every day | ORAL | 1 refills | Status: DC

## 2021-03-21 NOTE — Progress Notes (Signed)
BP (!) 158/80    Pulse 76    Temp 98.5 F (36.9 C) (Oral)    Ht 5' 6.93" (1.7 m)    Wt 166 lb (75.3 kg)    SpO2 97%    BMI 26.05 kg/m    Subjective:    Patient ID: Tony Fowler, male    DOB: [birth year], 75 y.o.   MRN: [id number]  Chief Complaint  Patient presents with   Hypertension   Benign Prostatic Hypertrophy   Knee Pain    For the last 3 months    HPI: Tony Fowler is a 75 y.o. male  HPI  Chief Complaint  Patient presents with   Hypertension   Benign Prostatic Hypertrophy   Knee Pain    For the last 3 months    Relevant past medical, surgical, family and social history reviewed and updated as indicated. Interim medical history since our last visit reviewed. Allergies and medications reviewed and updated.  Review of Systems  Per HPI unless specifically indicated above     Objective:    BP (!) 158/80    Pulse 76    Temp 98.5 F (36.9 C) (Oral)    Ht 5' 6.93" (1.7 m)    Wt 166 lb (75.3 kg)    SpO2 97%    BMI 26.05 kg/m   Wt Readings from Last 3 Encounters:  03/21/21 166 lb (75.3 kg)  01/05/21 163 lb (73.9 kg)  12/25/20 163 lb 3.2 oz (74 kg)    Physical Exam  Results for orders placed or performed in visit on 03/14/21  PSA  Result Value Ref Range   Prostate Specific Ag, Serum 3.9 0.0 - 4.0 ng/mL  TSH  Result Value Ref Range   TSH 1.020 0.450 - 4.500 uIU/mL  Lipid panel  Result Value Ref Range   Cholesterol, Total 198 100 - 199 mg/dL   Triglycerides 129 0 - 149 mg/dL   HDL 56 >39 mg/dL   VLDL Cholesterol Cal 23 5 - 40 mg/dL   LDL Chol Calc (NIH) 119 (H) 0 - 99 mg/dL   Chol/HDL Ratio 3.5 0.0 - 5.0 ratio  Comprehensive metabolic panel  Result Value Ref Range   Glucose 108 (H) 70 - 99 mg/dL   BUN 19 8 - 27 mg/dL   Creatinine, Ser 1.49 (H) 0.76 - 1.27 mg/dL   eGFR 49 (L) >59 mL/min/1.73   BUN/Creatinine Ratio 13 10 - 24   Sodium 140 134 - 144 mmol/L   Potassium 4.7 3.5 - 5.2 mmol/L   Chloride 104 96 - 106 mmol/L   CO2 22 20 - 29 mmol/L    Calcium 9.5 8.6 - 10.2 mg/dL   Total Protein 6.7 6.0 - 8.5 g/dL   Albumin 4.3 3.7 - 4.7 g/dL   Globulin, Total 2.4 1.5 - 4.5 g/dL   Albumin/Globulin Ratio 1.8 1.2 - 2.2   Bilirubin Total 0.2 0.0 - 1.2 mg/dL   Alkaline Phosphatase 114 44 - 121 IU/L   AST 32 0 - 40 IU/L   ALT 26 0 - 44 IU/L  CBC w/Diff  Result Value Ref Range   WBC 6.9 3.4 - 10.8 x10E3/uL   RBC 4.79 4.14 - 5.80 x10E6/uL   Hemoglobin 14.7 13.0 - 17.7 g/dL   Hematocrit 44.3 37.5 - 51.0 %   MCV 93 79 - 97 fL   MCH 30.7 26.6 - 33.0 pg   MCHC 33.2 31.5 - 35.7 g/dL   RDW 12.7 11.6 - 15.4 %  Platelets 240 150 - 450 x10E3/uL   Neutrophils 59 Not Estab. %   Lymphs 29 Not Estab. %   Monocytes 7 Not Estab. %   Eos 4 Not Estab. %   Basos 1 Not Estab. %   Neutrophils Absolute 4.1 1.4 - 7.0 x10E3/uL   Lymphocytes Absolute 2.0 0.7 - 3.1 x10E3/uL   Monocytes Absolute 0.5 0.1 - 0.9 x10E3/uL   EOS (ABSOLUTE) 0.3 0.0 - 0.4 x10E3/uL   Basophils Absolute 0.0 0.0 - 0.2 x10E3/uL   Immature Granulocytes 0 Not Estab. %   Immature Grans (Abs) 0.0 0.0 - 0.1 x10E3/uL        Current Outpatient Medications:    ARIPiprazole (ABILIFY) 5 MG tablet, Take 5 mg by mouth daily., Disp: , Rfl:    aspirin EC 81 MG tablet, Take 81 mg by mouth daily., Disp: , Rfl:    carbamazepine (TEGRETOL) 200 MG tablet, Take 200 mg by mouth 3 (three) times daily., Disp: , Rfl:    gabapentin (NEURONTIN) 300 MG capsule, Take 1 capsule by mouth in the morning, at noon, in the evening, and at bedtime., Disp: , Rfl:    Multiple Vitamin (MULTIVITAMIN) tablet, Take 1 tablet by mouth daily., Disp: , Rfl:    omeprazole (PRILOSEC) 40 MG capsule, Take 1 capsule (40 mg total) by mouth daily., Disp: 90 capsule, Rfl: 4   propranolol (INDERAL) 10 MG tablet, Take 1 tablet (10 mg total) by mouth 3 (three) times daily., Disp: 90 tablet, Rfl: 13   propranolol (INDERAL) 10 MG tablet, Take 10 mg by mouth as needed (Take 1 tablet (10 mg total) by mouth Once PRN for up to 1 dose.).,  Disp: , Rfl:    rosuvastatin (CRESTOR) 40 MG tablet, Take 1 tablet (40 mg total) by mouth daily., Disp: 90 tablet, Rfl: 4   tamsulosin (FLOMAX) 0.4 MG CAPS capsule, Take 1 capsule (0.4 mg total) by mouth daily., Disp: 90 capsule, Rfl: 4    Assessment & Plan:  HTN is taking inderal 10 mg tid Continue current meds.  Medication compliance emphasised. pt advised to keep Bp logs. Pt verbalised understanding of the same. Pt to have a low salt diet . Exercise to reach a goal of at least 150 mins a week.  lifestyle modifications explained and pt understands importance of the above.   2. HLD is on crestor :  recheck FLP, check LFT's work on diet, SE of meds explained to pt. low fat and high fiber diet explained to pt.  3. Elevated PSA improving   4. creatnine elevated needs to increase water intake   Problem List Items Addressed This Visit       Cardiovascular and Mediastinum   Primary hypertension - Primary   Relevant Medications   propranolol (INDERAL) 10 MG tablet     Other   Mixed hyperlipidemia (Chronic)   Relevant Medications   propranolol (INDERAL) 10 MG tablet   Elevated PSA     No orders of the defined types were placed in this encounter.    Meds ordered this encounter  Medications   DISCONTD: amLODipine (NORVASC) 2.5 MG tablet    Sig: Take 1 tablet (2.5 mg total) by mouth daily.    Dispense:  30 tablet    Refill:  1     Follow up plan: Return in about 6 months (around 09/19/2021).

## 2021-03-21 NOTE — Patient Instructions (Signed)

## 2021-04-10 DIAGNOSIS — F331 Major depressive disorder, recurrent, moderate: Secondary | ICD-10-CM | POA: Diagnosis not present

## 2021-06-06 ENCOUNTER — Other Ambulatory Visit: Payer: Self-pay | Admitting: Internal Medicine

## 2021-06-06 NOTE — Telephone Encounter (Signed)
Requested medications are due for refill today.  no ? ?Requested medications are on the active medications list.  no ? ?Last refill. 03/21/2021 ? ?Future visit scheduled.   yes ? ?Notes to clinic.  Medication was discontinued 03/21/2021. ? ? ? ?Requested Prescriptions  ?Pending Prescriptions Disp Refills  ? amLODipine (NORVASC) 2.5 MG tablet [Pharmacy Med Name: AMLODIPINE BESYLATE 2.5 MG TAB] 30 tablet 1  ?  Sig: TAKE 1 TABLET BY MOUTH DAILY  ?  ? Cardiovascular: Calcium Channel Blockers 2 Failed - 06/06/2021 11:28 AM  ?  ?  Failed - Last BP in normal range  ?  BP Readings from Last 1 Encounters:  ?03/21/21 (!) 158/80  ?  ?  ?  ?  Passed - Last Heart Rate in normal range  ?  Pulse Readings from Last 1 Encounters:  ?03/21/21 76  ?  ?  ?  ?  Passed - Valid encounter within last 6 months  ?  Recent Outpatient Visits   ? ?      ? 2 months ago Primary hypertension  ? [facility] Vigg, Avanti, MD  ? 5 months ago Need for influenza vaccination  ? [facility] Vigg, Avanti, MD  ? 12 months ago Bipolar affective disorder, manic, severe, with psychotic behavior (HCC)  ? [facility], [place] T, NP  ? 1 year ago Bipolar affective disorder, manic, severe, with psychotic behavior (HCC)  ? [facility] [place], [place] T, NP  ? 2 years ago Benign hypertension  ? [facility] Dossie Arbour, Redge Gainer, MD  ? ?  ?  ?Future Appointments   ? ?        ? In 3 months Vigg, Avanti, MD [facility], [facility]  ? In 7 months  [facility]  ? ?  ? ?  ?  ?  ?  ?

## 2021-06-23 DIAGNOSIS — Z20822 Contact with and (suspected) exposure to covid-19: Secondary | ICD-10-CM | POA: Diagnosis not present

## 2021-07-23 DIAGNOSIS — Z20822 Contact with and (suspected) exposure to covid-19: Secondary | ICD-10-CM | POA: Diagnosis not present

## 2021-08-11 DIAGNOSIS — Z20822 Contact with and (suspected) exposure to covid-19: Secondary | ICD-10-CM | POA: Diagnosis not present

## 2021-09-19 ENCOUNTER — Ambulatory Visit: Payer: Medicare Other | Admitting: Internal Medicine

## 2021-09-22 NOTE — Patient Instructions (Incomplete)

## 2021-09-26 ENCOUNTER — Ambulatory Visit (INDEPENDENT_AMBULATORY_CARE_PROVIDER_SITE_OTHER): Payer: Medicare Other | Admitting: Nurse Practitioner

## 2021-09-26 ENCOUNTER — Encounter: Payer: Self-pay | Admitting: Nurse Practitioner

## 2021-09-26 VITALS — BP 138/86 | HR 67 | Temp 98.1°F | Wt 170.8 lb

## 2021-09-26 DIAGNOSIS — N1831 Chronic kidney disease, stage 3a: Secondary | ICD-10-CM

## 2021-09-26 DIAGNOSIS — Z87891 Personal history of nicotine dependence: Secondary | ICD-10-CM | POA: Diagnosis not present

## 2021-09-26 DIAGNOSIS — Z5181 Encounter for therapeutic drug level monitoring: Secondary | ICD-10-CM | POA: Diagnosis not present

## 2021-09-26 DIAGNOSIS — R7301 Impaired fasting glucose: Secondary | ICD-10-CM

## 2021-09-26 DIAGNOSIS — R972 Elevated prostate specific antigen [PSA]: Secondary | ICD-10-CM

## 2021-09-26 DIAGNOSIS — N138 Other obstructive and reflux uropathy: Secondary | ICD-10-CM | POA: Diagnosis not present

## 2021-09-26 DIAGNOSIS — K219 Gastro-esophageal reflux disease without esophagitis: Secondary | ICD-10-CM | POA: Diagnosis not present

## 2021-09-26 DIAGNOSIS — G251 Drug-induced tremor: Secondary | ICD-10-CM | POA: Diagnosis not present

## 2021-09-26 DIAGNOSIS — F3112 Bipolar disorder, current episode manic without psychotic features, moderate: Secondary | ICD-10-CM | POA: Diagnosis not present

## 2021-09-26 DIAGNOSIS — N401 Enlarged prostate with lower urinary tract symptoms: Secondary | ICD-10-CM | POA: Diagnosis not present

## 2021-09-26 DIAGNOSIS — I10 Essential (primary) hypertension: Secondary | ICD-10-CM | POA: Diagnosis not present

## 2021-09-26 DIAGNOSIS — F17210 Nicotine dependence, cigarettes, uncomplicated: Secondary | ICD-10-CM

## 2021-09-26 DIAGNOSIS — E782 Mixed hyperlipidemia: Secondary | ICD-10-CM | POA: Diagnosis not present

## 2021-09-26 LAB — MICROALBUMIN, URINE WAIVED
Creatinine, Urine Waived: 100 mg/dL (ref 10–300)
Microalb, Ur Waived: 10 mg/L (ref 0–19)
Microalb/Creat Ratio: <30 mg/g (ref <30)

## 2021-09-26 MED ORDER — TAMSULOSIN HCL 0.4 MG PO CAPS: 0.4000 mg | ORAL_CAPSULE | Freq: Every day | ORAL | 4 refills | Status: DC

## 2021-09-26 MED ORDER — OMEPRAZOLE 40 MG PO CPDR: 40.0000 mg | DELAYED_RELEASE_CAPSULE | Freq: Every day | ORAL | 4 refills | Status: DC

## 2021-09-26 MED ORDER — PROPRANOLOL HCL 10 MG PO TABS: 10.0000 mg | ORAL_TABLET | Freq: Three times a day (TID) | ORAL | 13 refills | Status: DC

## 2021-09-26 MED ORDER — ROSUVASTATIN CALCIUM 40 MG PO TABS: 40.0000 mg | ORAL_TABLET | Freq: Every day | ORAL | 4 refills | Status: DC

## 2021-09-26 NOTE — Assessment & Plan Note (Addendum)
Chronic, stable.  Will continue Propranolol, refills sent in, and continue collaboration with neurology as needed.  Recent note reviewed. 

## 2021-09-26 NOTE — Assessment & Plan Note (Signed)
Chronic, ongoing.  Initial BP elevated, but repeat improving.  Home BP's average at goal for age -- has known white coat syndrome.  Have recommended adding on a low dose of Losartan, would avoid ACE as suspect some underlying COPD.  He refuses starting new medication today.  Will continue Propranolol for BP and tremor.  Recommend he monitor BP at home daily and document for provider visits + focus on DASH diet.  CMP, CBC, urine ALB today.  Refills sent in.  Return in 6 months.

## 2021-09-26 NOTE — Assessment & Plan Note (Signed)
Chronic, stable with no symptoms at this time.  Last PSA 3.9 in December 2023 -- normal for age and ethnicity.  Continue current medication regimen and adjust as needed.  If elevation in PSA above goal for age 75.2 or greater, then consider urology referral. 

## 2021-09-26 NOTE — Assessment & Plan Note (Signed)
Chronic, stable with Protonix.  Continue current regimen and consider trial reduction in future, if tolerated would consider discontinuation.  Risks of PPI use were discussed with patient including bone loss, C. Diff diarrhea, pneumonia, infections, CKD, electrolyte abnormalities.  Verbalizes understanding and chooses to continue the medication. Mag level today.  Return in 6 months. 

## 2021-09-26 NOTE — Progress Notes (Signed)
BP 138/86 (BP Location: Left Arm, Patient Position: Sitting)   Pulse 67   Temp 98.1 F (36.7 C) (Oral)   Wt 170 lb 12.8 oz (77.5 kg)   SpO2 95%   BMI 26.81 kg/m    Subjective:    Patient ID: Tony Fowler, male    DOB: [birth year], 76 y.o.   MRN: [id number]  HPI: Tony Fowler is a 76 y.o. male  Chief Complaint  Patient presents with   Hypertension   Follow-up    No questions or concerns per patient this morning.    Hyperlipidemia   HYPERTENSION / HYPERLIPIDEMIA Continues on Propranolol 10 MG TID (for tremor and BP) and ASA + Crestor 40 MG daily.  Was started on Amlodipine with last visit, but has not taken in 2 months.  Quit smoking on March 7th, 2020 due to pneumonia and hospitalization.  He had smoked since 76 years old, 1 PPD per day.  Declines lung CT cancer screening. Satisfied with current treatment? yes Duration of hypertension: chronic BP monitoring frequency: daily BP range: 135/80 range at home -- has white coat BP medication side effects: no Duration of hyperlipidemia: chronic Cholesterol medication side effects: no Cholesterol supplements: none Medication compliance: good compliance Aspirin: yes Recent stressors: no Recurrent headaches: no Visual changes: no Palpitations: no Dyspnea: no Chest pain: no Lower extremity edema: no Dizzy/lightheaded: no  BPH Continues on Tamsulosin for BPH.  Recent PSA trend down an in normal range for his age and ethnicity. BPH status: controlled Satisfied with current treatment?: yes Medication side effects: no Medication compliance: good compliance Duration: chronic Nocturia: 1/night Urinary frequency:no Incomplete voiding: no Urgency: no Weak urinary stream: occasional Straining to start stream: no Dysuria: no Onset: gradual Severity: mild Alleviating factors: Flomax  Aggravating factors: none Treatments attempted: Flomax  GERD Continues on Prilosec 40 MG daily.  This offers benefit, does have some heart  burn with spicy foods. GERD control status: controlled  Satisfied with current treatment? yes Heartburn frequency: none Antacid use frequency:  none Dysphagia: no Odynophagia:  no Hematemesis: no Blood in stool: no EGD: no   BIPOLAR AFFECTIVE DISORDER: Is followed by psychiatry and last visit he reports was in April 2023 -- Dr. Rogers Blocker. Continues on Abilify 5 MG daily and Tegretol 200 MG TID.  Followed by neurology for drug-induced tremor, last seen 09/28/20 with plan to continue Gabapentin 300 MG QID and Propranolol 10 MG TID. Mood status: stable Satisfied with current treatment?: yes Symptom severity: mild  Duration of current treatment : chronic Side effects: no Medication compliance: good compliance Psychotherapy/counseling: current Depressed mood: no Anxious mood:  occasional Anhedonia: no Significant weight loss or gain: no Insomnia: no Fatigue: no Feelings of worthlessness or guilt: no Impaired concentration/indecisiveness: no Suicidal ideations: no Hopelessness: no Crying spells: no    09/26/2021    9:30 AM 03/21/2021    9:18 AM 01/05/2021    9:01 AM 06/07/2020    9:27 AM 01/03/2020    9:02 AM  Depression screen PHQ 2/9  Decreased Interest 0 0 0 0 0  Down, Depressed, Hopeless 0 0 0 0 0  PHQ - 2 Score 0 0 0 0 0  Altered sleeping 0 0  0   Tired, decreased energy 0 0  0   Change in appetite 0 0  0   Feeling bad or failure about yourself  0 0  0   Trouble concentrating 0 0  0   Moving slowly or fidgety/restless 0 0  0  Suicidal thoughts 0 0  0   PHQ-9 Score 0 0  0   Difficult doing work/chores Not difficult at all Not difficult at all          09/26/2021    9:30 AM 03/21/2021    9:18 AM  GAD 7 : Generalized Anxiety Score  Nervous, Anxious, on Edge 0 0  Control/stop worrying 0 0  Worry too much - different things 0 0  Trouble relaxing 0 0  Restless 0 0  Easily annoyed or irritable 0 0  Afraid - awful might happen 0 0  Total GAD 7 Score 0 0  Anxiety  Difficulty Not difficult at all Not difficult at all     Relevant past medical, surgical, family and social history reviewed and updated as indicated. Interim medical history since our last visit reviewed. Allergies and medications reviewed and updated.  Review of Systems  Constitutional:  Negative for activity change, diaphoresis, fatigue and fever.  Respiratory:  Negative for cough, chest tightness, shortness of breath and wheezing.   Cardiovascular:  Negative for chest pain, palpitations and leg swelling.  Gastrointestinal: Negative.   Endocrine: Negative.   Neurological: Negative.   Psychiatric/Behavioral: Negative.      Per HPI unless specifically indicated above     Objective:    BP 138/86 (BP Location: Left Arm, Patient Position: Sitting)   Pulse 67   Temp 98.1 F (36.7 C) (Oral)   Wt 170 lb 12.8 oz (77.5 kg)   SpO2 95%   BMI 26.81 kg/m   Wt Readings from Last 3 Encounters:  09/26/21 170 lb 12.8 oz (77.5 kg)  03/21/21 166 lb (75.3 kg)  01/05/21 163 lb (73.9 kg)    Physical Exam Vitals and nursing note reviewed.  Constitutional:      General: He is awake. He is not in acute distress.    Appearance: He is well-developed, well-groomed and overweight. He is not ill-appearing.  HENT:     Head: Normocephalic and atraumatic.     Right Ear: Hearing normal. No drainage.     Left Ear: Hearing normal. No drainage.  Eyes:     General: Lids are normal.        Right eye: No discharge.        Left eye: No discharge.     Conjunctiva/sclera: Conjunctivae normal.     Pupils: Pupils are equal, round, and reactive to light.  Neck:     Thyroid: No thyromegaly.     Vascular: No carotid bruit.     Trachea: Trachea normal.  Cardiovascular:     Rate and Rhythm: Normal rate and regular rhythm.     Heart sounds: Normal heart sounds, S1 normal and S2 normal. No murmur heard.    No gallop.  Pulmonary:     Effort: Pulmonary effort is normal. No accessory muscle usage or respiratory  distress.     Breath sounds: Normal breath sounds.  Abdominal:     General: Bowel sounds are normal.     Palpations: Abdomen is soft.  Musculoskeletal:        General: Normal range of motion.     Cervical back: Normal range of motion and neck supple.     Right lower leg: No edema.     Left lower leg: No edema.  Skin:    General: Skin is warm and dry.     Capillary Refill: Capillary refill takes less than 2 seconds.     Findings: No rash.  Neurological:  Mental Status: He is alert and oriented to person, place, and time.     Deep Tendon Reflexes: Reflexes are normal and symmetric.  Psychiatric:        Attention and Perception: Attention normal.        Mood and Affect: Mood normal.        Speech: Speech normal.        Behavior: Behavior normal. Behavior is cooperative.        Thought Content: Thought content normal.    Results for orders placed or performed in visit on 09/26/21  Microalbumin, Urine Waived  Result Value Ref Range   Microalb, Ur Waived 10 0 - 19 mg/L   Creatinine, Urine Waived 100 10 - 300 mg/dL   Microalb/Creat Ratio <30 <30 mg/g      Assessment & Plan:   Problem List Items Addressed This Visit       Cardiovascular and Mediastinum   Benign hypertension (Chronic)    Chronic, ongoing.  Initial BP elevated, but repeat improving.  Home BP's average at goal for age -- has known white coat syndrome.  Have recommended adding on a low dose of Losartan, would avoid ACE as suspect some underlying COPD.  He refuses starting new medication today.  Will continue Propranolol for BP and tremor.  Recommend he monitor BP at home daily and document for provider visits + focus on DASH diet.  CMP, CBC, urine ALB today.  Refills sent in.  Return in 6 months.      Relevant Medications   rosuvastatin (CRESTOR) 40 MG tablet   propranolol (INDERAL) 10 MG tablet   Other Relevant Orders   CBC with Differential/Platelet   Comprehensive metabolic panel     Digestive   GERD  (gastroesophageal reflux disease)    Chronic, stable with Protonix.  Continue current regimen and consider trial reduction in future, if tolerated would consider discontinuation.  Risks of PPI use were discussed with patient including bone loss, C. Diff diarrhea, pneumonia, infections, CKD, electrolyte abnormalities.  Verbalizes understanding and chooses to continue the medication. Mag level today.  Return in 6 months.      Relevant Medications   omeprazole (PRILOSEC) 40 MG capsule   Other Relevant Orders   Magnesium     Nervous and Auditory   Drug-induced tremor    Chronic, stable.  Will continue Propranolol, refills sent in, and continue collaboration with neurology as needed.  Recent note reviewed.      Relevant Medications   propranolol (INDERAL) 10 MG tablet     Genitourinary   BPH with obstruction/lower urinary tract symptoms (Chronic)    Chronic, stable with no symptoms at this time.  Last PSA 3.9 in December 2023 -- normal for age and ethnicity.  Continue current medication regimen and adjust as needed.  If elevation in PSA above goal for age 83.2 or greater, then consider urology referral.      Relevant Medications   tamsulosin (FLOMAX) 0.4 MG CAPS capsule   CKD (chronic kidney disease) stage 3, GFR 30-59 ml/min (HCC) - Primary    Chronic, ongoing.  Remaining stable on recent labs.  Recommend Losartan or alternate ARB addition for kidney protection.  He refuses today.  Recheck urine ALB and CMP.        Relevant Orders   Comprehensive metabolic panel   Microalbumin, Urine Waived (Completed)     Other   Mixed hyperlipidemia (Chronic)    Chronic, ongoing.  Continue current medication regimen and adjust as needed.  CMP and lipid panel today.  Refills sent in.  Return in 6 months.      Relevant Medications   rosuvastatin (CRESTOR) 40 MG tablet   propranolol (INDERAL) 10 MG tablet   Other Relevant Orders   Comprehensive metabolic panel   Lipid Panel w/o Chol/HDL Ratio    Bipolar disorder, current episode manic without psychotic features, moderate (HCC)    Chronic, stable.  Denies SI/HI.  Continue collaboration with psychiatry and regimen as prescribed by them.       History of smoking 10-25 pack years    Quit in March 2020 after PNA.  Recommend continued cessation.  Discussed at length CT lung CA screening with him and provided pamphlet on this for review, recommend he obtain this screening.  He is not interested.  Suspect some underlying COPD present.      Other Visit Diagnoses     IFG (impaired fasting glucose)       A1c check today.   Relevant Orders   HgB A1c   Medication monitoring encounter       On Abilify -- check Prolactin and A1c + Lipid panel and CMP + CBC.   Relevant Orders   HgB A1c   Prolactin        Follow up plan: Return in about 6 months (around 03/28/2022) for HTN/HLD, CKD, GERD, MOOD, BPH.

## 2021-09-26 NOTE — Assessment & Plan Note (Signed)
Chronic, ongoing.  Continue current medication regimen and adjust as needed.  CMP and lipid panel today.  Refills sent in.  Return in 6 months.

## 2021-09-26 NOTE — Assessment & Plan Note (Signed)
Chronic, ongoing.  Remaining stable on recent labs.  Recommend Losartan or alternate ARB addition for kidney protection.  He refuses today.  Recheck urine ALB and CMP.   

## 2021-09-26 NOTE — Assessment & Plan Note (Signed)
Quit in March 2020 after PNA.  Recommend continued cessation.  Discussed at length CT lung CA screening with him and provided pamphlet on this for review, recommend he obtain this screening.  He is not interested.  Suspect some underlying COPD present. 

## 2021-09-26 NOTE — Assessment & Plan Note (Addendum)
Chronic, stable.  Denies SI/HI.  Continue collaboration with psychiatry and regimen as prescribed by them.

## 2021-09-27 DIAGNOSIS — G251 Drug-induced tremor: Secondary | ICD-10-CM | POA: Diagnosis not present

## 2021-09-27 DIAGNOSIS — I10 Essential (primary) hypertension: Secondary | ICD-10-CM | POA: Diagnosis not present

## 2021-09-27 DIAGNOSIS — F312 Bipolar disorder, current episode manic severe with psychotic features: Secondary | ICD-10-CM | POA: Diagnosis not present

## 2021-09-27 LAB — COMPREHENSIVE METABOLIC PANEL
ALT: 26 IU/L (ref 0–44)
AST: 29 IU/L (ref 0–40)
Albumin/Globulin Ratio: 1.9 (ref 1.2–2.2)
Albumin: 4.2 g/dL (ref 3.7–4.7)
Alkaline Phosphatase: 102 IU/L (ref 44–121)
BUN/Creatinine Ratio: 15 (ref 10–24)
BUN: 19 mg/dL (ref 8–27)
Bilirubin Total: 0.2 mg/dL (ref 0.0–1.2)
CO2: 23 mmol/L (ref 20–29)
Calcium: 9.3 mg/dL (ref 8.6–10.2)
Chloride: 104 mmol/L (ref 96–106)
Creatinine, Ser: 1.3 mg/dL — ABNORMAL HIGH (ref 0.76–1.27)
Globulin, Total: 2.2 g/dL (ref 1.5–4.5)
Glucose: 103 mg/dL — ABNORMAL HIGH (ref 70–99)
Potassium: 4.5 mmol/L (ref 3.5–5.2)
Sodium: 139 mmol/L (ref 134–144)
Total Protein: 6.4 g/dL (ref 6.0–8.5)
eGFR: 58 mL/min/{1.73_m2} — ABNORMAL LOW (ref >59)

## 2021-09-27 LAB — HEMOGLOBIN A1C
Est. average glucose Bld gHb Est-mCnc: 120 mg/dL
Hgb A1c MFr Bld: 5.8 % — ABNORMAL HIGH (ref 4.8–5.6)

## 2021-09-27 LAB — CBC WITH DIFFERENTIAL/PLATELET
Basophils Absolute: 0 10*3/uL (ref 0.0–0.2)
Basos: 1 %
EOS (ABSOLUTE): 0.2 10*3/uL (ref 0.0–0.4)
Eos: 3 %
Hematocrit: 42.4 % (ref 37.5–51.0)
Hemoglobin: 14.1 g/dL (ref 13.0–17.7)
Immature Grans (Abs): 0 10*3/uL (ref 0.0–0.1)
Immature Granulocytes: 0 %
Lymphocytes Absolute: 1.6 10*3/uL (ref 0.7–3.1)
Lymphs: 23 %
MCH: 31.8 pg (ref 26.6–33.0)
MCHC: 33.3 g/dL (ref 31.5–35.7)
MCV: 96 fL (ref 79–97)
Monocytes Absolute: 0.6 10*3/uL (ref 0.1–0.9)
Monocytes: 8 %
Neutrophils Absolute: 4.4 10*3/uL (ref 1.4–7.0)
Neutrophils: 65 %
Platelets: 196 10*3/uL (ref 150–450)
RBC: 4.44 x10E6/uL (ref 4.14–5.80)
RDW: 12.9 % (ref 11.6–15.4)
WBC: 6.8 10*3/uL (ref 3.4–10.8)

## 2021-09-27 LAB — LIPID PANEL W/O CHOL/HDL RATIO
Cholesterol, Total: 187 mg/dL (ref 100–199)
HDL: 54 mg/dL (ref >39)
LDL Chol Calc (NIH): 102 mg/dL — ABNORMAL HIGH (ref 0–99)
Triglycerides: 182 mg/dL — ABNORMAL HIGH (ref 0–149)
VLDL Cholesterol Cal: 31 mg/dL (ref 5–40)

## 2021-09-27 LAB — PROLACTIN: Prolactin: 10.4 ng/mL (ref 4.0–15.2)

## 2021-09-27 LAB — MAGNESIUM: Magnesium: 1.9 mg/dL (ref 1.6–2.3)

## 2021-09-27 NOTE — Progress Notes (Signed)
Good afternoon, please let Tony Fowler know his labs have returned: - A1c, diabetes testing, remains in prediabetic range at 5.8%, this has come down some since previous check. - Kidney function continues to show some mild kidney disease, but no decline.  Continue to ensure you drink plenty of water daily and add a little lemon to this.  Liver function is normal. - Cholesterol remains a little above goal, please ensure to continue to take Rosuvastatin daily and next visit may consider adding on Zetia to this to help lower levels a bit more. - Remainder of labs are all normal.  Any questions? Keep being amazing!!  Thank you for allowing me to participate in your care.  I appreciate you. Kindest regards, Elwin Tsou

## 2021-11-14 ENCOUNTER — Telehealth: Payer: Self-pay | Admitting: Internal Medicine

## 2021-11-14 NOTE — Telephone Encounter (Signed)
Copied from CRM [id number]. Topic: Referral - Request for Referral >> Nov 14, 2021  9:32 AM Lyman Speller wrote: Has patient seen PCP for this complaint? Yes  *If NO, is insurance requiring patient see PCP for this issue before PCP can refer them? Referral for which specialty: therapist  Preferred provider/office: [facility] / they need his Bi polar diagnoses and his med list for Aripiprazole and carbamazepine  Reason for referral: old office closed down and pt needs new referral to another location

## 2021-11-15 NOTE — Telephone Encounter (Signed)
Can we please call and find out what's going on here? This sounds like he needs the previous records from his old psychiatrist, not to see a therapist.

## 2021-11-19 NOTE — Telephone Encounter (Signed)
Per patient he was able to get his records via MyChart, no further questions or concerns. No referral needed.

## 2021-11-21 NOTE — Telephone Encounter (Signed)
Patient checking status of referral   Patient state he requested  psychiatrist referral last week on 8-9  Patient requesting referral sent to:  [facility]    Please fu w/ patient w/ status update

## 2021-11-21 NOTE — Telephone Encounter (Signed)
Appointment made for tomorrow to see Denny Peon to request referral

## 2021-11-22 ENCOUNTER — Ambulatory Visit (INDEPENDENT_AMBULATORY_CARE_PROVIDER_SITE_OTHER): Payer: Medicare Other | Admitting: Physician Assistant

## 2021-11-22 VITALS — BP 162/91 | HR 78 | Temp 98.6°F | Ht 66.93 in | Wt 167.5 lb

## 2021-11-22 DIAGNOSIS — F3112 Bipolar disorder, current episode manic without psychotic features, moderate: Secondary | ICD-10-CM | POA: Diagnosis not present

## 2021-11-22 NOTE — Assessment & Plan Note (Signed)
Chronic, appears well controlled per pt  He is taking Carbamazepine 200 mg PO TID and Abilify 5 mg PO QD and reports he is tolerating well States he has not had manic episode in almost 6 years  Patient would like to be managed by [facility] at this time since his previous psychiatric provider stopped practicing suddenly  Referral placed today. Pt states he has enough medication supply to last for some time but is aware we can bridge until he is seen with new psych provider Follow up as needed

## 2021-11-22 NOTE — Patient Instructions (Signed)
   Please call us if you do not hear anything in 2 weeks about your referral or scheduling an apt with Psychiatry Call us if you need refills of your medications while waiting to see psychiatry

## 2021-11-22 NOTE — Progress Notes (Signed)
Established Patient Office Visit  Name: Tony Fowler   MRN: [id number]    DOB: 12/06/1945   Date:11/22/2021  Today's Provider: Talitha Givens, MHS, PA-C Introduced myself to the patient as a PA-C and provided education on APPs in clinical practice.         Subjective  Chief Complaint  Chief Complaint  Patient presents with   Referral    Wants referral to [facility]  in [place]    HPI  Bipolar disorder  Reports he needs referral to new Psychiatry office as his previous provider left practice  Would like to go to [facility].  Meds- states he has enough medications to last through this time  Symptoms- reports he feels like his symptoms are very well controlled on current regimen States he knows what his manic episodes feel like and has not had one since he came off Lithium in 2017    Elevated BP Reports bp is usually 135/86 at home- gets elevated in dr office settings   Patient Active Problem List   Diagnosis Date Noted   Mixed hyperlipidemia 12/08/2019   Onychomycosis 07/01/2018   Advanced care planning/counseling discussion 06/26/2017   Drug-induced tremor 05/16/2016   GERD (gastroesophageal reflux disease) 05/16/2016   Inguinal hernia 05/16/2016   Bipolar disorder, current episode manic without psychotic features, moderate ([place]) 12/01/2015   History of smoking 10-25 pack years 12/01/2015   BPH with obstruction/lower urinary tract symptoms 02/22/2015   Benign hypertension 09/07/2014   CKD (chronic kidney disease) stage 3, GFR 30-59 ml/min ([place]) 09/07/2014    Past Surgical History:  Procedure Laterality Date   TONSILECTOMY/ADENOIDECTOMY WITH MYRINGOTOMY      Family History  Problem Relation Age of Onset   Heart disease Mother    Hypertension Mother    Mental illness Mother    Heart disease Brother    Hypertension Brother     Social History   Tobacco Use   Smoking status: Former     Packs/day: 0.50    Years: 0.00    Total pack years: 0.00    Types: Cigarettes    Quit date: 06/13/2018    Years since quitting: 3.4   Smokeless tobacco: Never  Substance Use Topics   Alcohol use: No    Alcohol/week: 0.0 standard drinks of alcohol    Comment: 1 beer occasionally      Current Outpatient Medications:    ARIPiprazole (ABILIFY) 5 MG tablet, Take 5 mg by mouth daily., Disp: , Rfl:    aspirin EC 81 MG tablet, Take 81 mg by mouth daily., Disp: , Rfl:    carbamazepine (TEGRETOL) 200 MG tablet, Take 200 mg by mouth 3 (three) times daily., Disp: , Rfl:    gabapentin (NEURONTIN) 300 MG capsule, Take by mouth., Disp: , Rfl:    Multiple Vitamin (MULTIVITAMIN) tablet, Take 1 tablet by mouth daily., Disp: , Rfl:    omeprazole (PRILOSEC) 40 MG capsule, Take 1 capsule (40 mg total) by mouth daily., Disp: 90 capsule, Rfl: 4   propranolol (INDERAL) 10 MG tablet, Take 10 mg by mouth as needed (Take 1 tablet (10 mg total) by mouth Once PRN for up to 1 dose.)., Disp: , Rfl:    propranolol (INDERAL) 10 MG tablet, Take 1 tablet (10 mg total) by mouth 3 (three) times daily., Disp: 90 tablet, Rfl: 13   rosuvastatin (CRESTOR) 40 MG tablet, Take 1 tablet (40 mg total) by mouth daily.,  Disp: 90 tablet, Rfl: 4   tamsulosin (FLOMAX) 0.4 MG CAPS capsule, Take 1 capsule (0.4 mg total) by mouth daily., Disp: 90 capsule, Rfl: 4   gabapentin (NEURONTIN) 300 MG capsule, Take 1 capsule by mouth in the morning, at noon, in the evening, and at bedtime., Disp: , Rfl:   Allergies  Allergen Reactions   Penicillins Rash    Childhood allergy, more than 30 years ago. No hospital required.    I personally reviewed active problem list, medication list, allergies, health maintenance, notes from last encounter, lab results with the patient/caregiver today.   Review of Systems  Gastrointestinal:  Negative for constipation, diarrhea, nausea and vomiting.  Neurological:  Negative for dizziness, seizures, loss of  consciousness, weakness and headaches.  Psychiatric/Behavioral:  Negative for depression, hallucinations and suicidal ideas. The patient is not nervous/anxious and does not have insomnia.       Objective  Vitals:   11/22/21 1034 11/22/21 1039  BP: (!) 170/103 (!) 162/91  Pulse: 74 78  Temp: 98.6 F (37 C)   TempSrc: Oral   SpO2: 95%   Weight: 167 lb 8 oz (76 kg)   Height: 5' 6.93" (1.7 m)     Body mass index is 26.29 kg/m.  Physical Exam Vitals reviewed.  Constitutional:      General: He is awake.     Appearance: Normal appearance. He is well-developed and well-groomed.  HENT:     Head: Normocephalic and atraumatic.  Pulmonary:     Effort: Pulmonary effort is normal.  Neurological:     General: No focal deficit present.     Mental Status: He is alert and oriented to person, place, and time.     GCS: GCS eye subscore is 4. GCS verbal subscore is 5. GCS motor subscore is 6.     Cranial Nerves: No dysarthria or facial asymmetry.     Motor: No weakness, tremor or atrophy.  Psychiatric:        Attention and Perception: Attention and perception normal.        Mood and Affect: Mood and affect normal.        Speech: Speech normal.        Behavior: Behavior normal. Behavior is cooperative.        Cognition and Memory: Cognition normal.      Recent Results (from the past 2160 hour(s))  Microalbumin, Urine Waived     Status: None   Collection Time: 09/26/21 10:00 AM  Result Value Ref Range   Microalb, Ur Waived 10 0 - 19 mg/L   Creatinine, Urine Waived 100 10 - 300 mg/dL   Microalb/Creat Ratio <30 <30 mg/g    Comment:                              Abnormal:       30 - 300                         High Abnormal:           >300   CBC with Differential/Platelet     Status: None   Collection Time: 09/26/21 10:02 AM  Result Value Ref Range   WBC 6.8 3.4 - 10.8 x10E3/uL   RBC 4.44 4.14 - 5.80 x10E6/uL   Hemoglobin 14.1 13.0 - 17.7 g/dL   Hematocrit 42.4 37.5 - 51.0 %    MCV 96 79 - 97  fL   MCH 31.8 26.6 - 33.0 pg   MCHC 33.3 31.5 - 35.7 g/dL   RDW 12.9 11.6 - 15.4 %   Platelets 196 150 - 450 x10E3/uL   Neutrophils 65 Not Estab. %   Lymphs 23 Not Estab. %   Monocytes 8 Not Estab. %   Eos 3 Not Estab. %   Basos 1 Not Estab. %   Neutrophils Absolute 4.4 1.4 - 7.0 x10E3/uL   Lymphocytes Absolute 1.6 0.7 - 3.1 x10E3/uL   Monocytes Absolute 0.6 0.1 - 0.9 x10E3/uL   EOS (ABSOLUTE) 0.2 0.0 - 0.4 x10E3/uL   Basophils Absolute 0.0 0.0 - 0.2 x10E3/uL   Immature Granulocytes 0 Not Estab. %   Immature Grans (Abs) 0.0 0.0 - 0.1 x10E3/uL  Comprehensive metabolic panel     Status: Abnormal   Collection Time: 09/26/21 10:02 AM  Result Value Ref Range   Glucose 103 (H) 70 - 99 mg/dL   BUN 19 8 - 27 mg/dL   Creatinine, Ser 1.30 (H) 0.76 - 1.27 mg/dL   eGFR 58 (L) >59 mL/min/1.73   BUN/Creatinine Ratio 15 10 - 24   Sodium 139 134 - 144 mmol/L   Potassium 4.5 3.5 - 5.2 mmol/L   Chloride 104 96 - 106 mmol/L   CO2 23 20 - 29 mmol/L   Calcium 9.3 8.6 - 10.2 mg/dL   Total Protein 6.4 6.0 - 8.5 g/dL   Albumin 4.2 3.7 - 4.7 g/dL   Globulin, Total 2.2 1.5 - 4.5 g/dL   Albumin/Globulin Ratio 1.9 1.2 - 2.2   Bilirubin Total 0.2 0.0 - 1.2 mg/dL   Alkaline Phosphatase 102 44 - 121 IU/L   AST 29 0 - 40 IU/L   ALT 26 0 - 44 IU/L  Lipid Panel w/o Chol/HDL Ratio     Status: Abnormal   Collection Time: 09/26/21 10:02 AM  Result Value Ref Range   Cholesterol, Total 187 100 - 199 mg/dL   Triglycerides 182 (H) 0 - 149 mg/dL   HDL 54 >39 mg/dL   VLDL Cholesterol Cal 31 5 - 40 mg/dL   LDL Chol Calc (NIH) 102 (H) 0 - 99 mg/dL  HgB A1c     Status: Abnormal   Collection Time: 09/26/21 10:02 AM  Result Value Ref Range   Hgb A1c MFr Bld 5.8 (H) 4.8 - 5.6 %    Comment:          Prediabetes: 5.7 - 6.4          Diabetes: >6.4          Glycemic control for adults with diabetes: <7.0    Est. average glucose Bld gHb Est-mCnc 120 mg/dL  Prolactin     Status: None   Collection  Time: 09/26/21 10:02 AM  Result Value Ref Range   Prolactin 10.4 4.0 - 15.2 ng/mL  Magnesium     Status: None   Collection Time: 09/26/21 10:02 AM  Result Value Ref Range   Magnesium 1.9 1.6 - 2.3 mg/dL     PHQ2/9:    11/22/2021   10:48 AM 09/26/2021    9:30 AM 03/21/2021    9:18 AM 01/05/2021    9:01 AM 06/07/2020    9:27 AM  Depression screen PHQ 2/9  Decreased Interest 0 0 0 0 0  Down, Depressed, Hopeless 0 0 0 0 0  PHQ - 2 Score 0 0 0 0 0  Altered sleeping 0 0 0  0  Tired, decreased energy 0  0 0  0  Change in appetite 0 0 0  0  Feeling bad or failure about yourself  0 0 0  0  Trouble concentrating 0 0 0  0  Moving slowly or fidgety/restless 0 0 0  0  Suicidal thoughts 0 0 0  0  PHQ-9 Score 0 0 0  0  Difficult doing work/chores Not difficult at all Not difficult at all Not difficult at all        Fall Risk:    11/22/2021   10:48 AM 09/26/2021    9:30 AM 03/21/2021    9:18 AM 01/05/2021    9:01 AM 01/03/2020    9:02 AM  [place] in the past year? 0 0 0 0 0  Number falls in past yr: 0 0 0    Injury with Fall? 0 0 0    Risk for fall due to : Impaired mobility No Fall Risks No Fall Risks Medication side effect Impaired balance/gait;Medication side effect  Follow up Falls evaluation completed Falls evaluation completed Falls evaluation completed Falls evaluation completed;Education provided;Falls prevention discussed Falls evaluation completed;Education provided;Falls prevention discussed      Functional Status Survey:      Assessment & Plan  Problem List Items Addressed This Visit       Other   Bipolar disorder, current episode manic without psychotic features, moderate ([place]) - Primary    Chronic, appears well controlled per pt  He is taking Carbamazepine 200 mg PO TID and Abilify 5 mg PO QD and reports he is tolerating well States he has not had manic episode in almost 6 years  Patient would like to be managed by [facility] at this time since his previous psychiatric provider stopped practicing suddenly  Referral placed today. Pt states he has enough medication supply to last for some time but is aware we can bridge until he is seen with new psych provider Follow up as needed       Relevant Orders   Ambulatory referral to Psychiatry     No follow-ups on file.   I, Rusty Villella E Deondrae Mcgrail, PA-C, have reviewed all documentation for this visit. The documentation on 11/22/21 for the exam, diagnosis, procedures, and orders are all accurate and complete.   Talitha Givens, MHS, PA-C [facility]

## 2022-01-07 ENCOUNTER — Ambulatory Visit (INDEPENDENT_AMBULATORY_CARE_PROVIDER_SITE_OTHER): Payer: Medicare Other | Admitting: *Deleted

## 2022-01-07 DIAGNOSIS — Z0000 Encounter for general adult medical examination without abnormal findings: Secondary | ICD-10-CM | POA: Diagnosis not present

## 2022-01-07 NOTE — Patient Instructions (Signed)
Tony Fowler , Thank you for taking time to come for your Medicare Wellness Visit. I appreciate your ongoing commitment to your health goals. Please review the following plan we discussed and let me know if I can assist you in the future.   Screening recommendations/referrals: Colonoscopy: Education provided Recommended yearly ophthalmology/optometry visit for glaucoma screening and checkup Recommended yearly dental visit for hygiene and checkup  Vaccinations: Influenza vaccine: Education provided Pneumococcal vaccine: up to date Tdap vaccine: up to date Shingles vaccine: not required    Advanced directives: Education provided  Conditions/risks identified:   Next appointment: 04-17-2021 @ 8:20 [place]  Preventive Care 75 Years and Older, Male Preventive care refers to lifestyle choices and visits with your health care provider that can promote health and wellness. What does preventive care include? A yearly physical exam. This is also called an annual well check. Dental exams once or twice a year. Routine eye exams. Ask your health care provider how often you should have your eyes checked. Personal lifestyle choices, including: Daily care of your teeth and gums. Regular physical activity. Eating a healthy diet. Avoiding tobacco and drug use. Limiting alcohol use. Practicing safe sex. Taking low doses of aspirin every day. Taking vitamin and mineral supplements as recommended by your health care provider. What happens during an annual well check? The services and screenings done by your health care provider during your annual well check will depend on your age, overall health, lifestyle risk factors, and family history of disease. Counseling  Your health care provider may ask you questions about your: Alcohol use. Tobacco use. Drug use. Emotional well-being. Home and relationship well-being. Sexual activity. Eating habits. History of falls. Memory and ability to understand  (cognition). Work and work Statistician. Screening  You may have the following tests or measurements: Height, weight, and BMI. Blood pressure. Lipid and cholesterol levels. These may be checked every 5 years, or more frequently if you are over 19 years old. Skin check. Lung cancer screening. You may have this screening every year starting at age 63 if you have a 30-pack-year history of smoking and currently smoke or have quit within the past 15 years. Fecal occult blood test (FOBT) of the stool. You may have this test every year starting at age 59. Flexible sigmoidoscopy or colonoscopy. You may have a sigmoidoscopy every 5 years or a colonoscopy every 10 years starting at age 58. Prostate cancer screening. Recommendations will vary depending on your family history and other risks. Hepatitis C blood test. Hepatitis B blood test. Sexually transmitted disease (STD) testing. Diabetes screening. This is done by checking your blood sugar (glucose) after you have not eaten for a while (fasting). You may have this done every 1-3 years. Abdominal aortic aneurysm (AAA) screening. You may need this if you are a current or former smoker. Osteoporosis. You may be screened starting at age 13 if you are at high risk. Talk with your health care provider about your test results, treatment options, and if necessary, the need for more tests. Vaccines  Your health care provider may recommend certain vaccines, such as: Influenza vaccine. This is recommended every year. Tetanus, diphtheria, and acellular pertussis (Tdap, Td) vaccine. You may need a Td booster every 10 years. Zoster vaccine. You may need this after age 8. Pneumococcal 13-valent conjugate (PCV13) vaccine. One dose is recommended after age 67. Pneumococcal polysaccharide (PPSV23) vaccine. One dose is recommended after age 15. Talk to your health care provider about which screenings and vaccines you need  and how often you need them. This  information is not intended to replace advice given to you by your health care provider. Make sure you discuss any questions you have with your health care provider. Document Released: 04/21/2015 Document Revised: 12/13/2015 Document Reviewed: 01/24/2015 Elsevier Interactive Patient Education  2017 [place] Prevention in the Home Falls can cause injuries. They can happen to people of all ages. There are many things you can do to make your home safe and to help prevent falls. What can I do on the outside of my home? Regularly fix the edges of walkways and driveways and fix any cracks. Remove anything that might make you trip as you walk through a door, such as a raised step or threshold. Trim any bushes or trees on the path to your home. Use bright outdoor lighting. Clear any walking paths of anything that might make someone trip, such as rocks or tools. Regularly check to see if handrails are loose or broken. Make sure that both sides of any steps have handrails. Any raised decks and porches should have guardrails on the edges. Have any leaves, snow, or ice cleared regularly. Use sand or salt on walking paths during winter. Clean up any spills in your garage right away. This includes oil or grease spills. What can I do in the bathroom? Use night lights. Install grab bars by the toilet and in the tub and shower. Do not use towel bars as grab bars. Use non-skid mats or decals in the tub or shower. If you need to sit down in the shower, use a plastic, non-slip stool. Keep the floor dry. Clean up any water that spills on the floor as soon as it happens. Remove soap buildup in the tub or shower regularly. Attach bath mats securely with double-sided non-slip rug tape. Do not have throw rugs and other things on the floor that can make you trip. What can I do in the bedroom? Use night lights. Make sure that you have a light by your bed that is easy to reach. Do not use any sheets or  blankets that are too big for your bed. They should not hang down onto the floor. Have a firm chair that has side arms. You can use this for support while you get dressed. Do not have throw rugs and other things on the floor that can make you trip. What can I do in the kitchen? Clean up any spills right away. Avoid walking on wet floors. Keep items that you use a lot in easy-to-reach places. If you need to reach something above you, use a strong step stool that has a grab bar. Keep electrical cords out of the way. Do not use floor polish or wax that makes floors slippery. If you must use wax, use non-skid floor wax. Do not have throw rugs and other things on the floor that can make you trip. What can I do with my stairs? Do not leave any items on the stairs. Make sure that there are handrails on both sides of the stairs and use them. Fix handrails that are broken or loose. Make sure that handrails are as long as the stairways. Check any carpeting to make sure that it is firmly attached to the stairs. Fix any carpet that is loose or worn. Avoid having throw rugs at the top or bottom of the stairs. If you do have throw rugs, attach them to the floor with carpet tape. Make sure that you  have a light switch at the top of the stairs and the bottom of the stairs. If you do not have them, ask someone to add them for you. What else can I do to help prevent falls? Wear shoes that: Do not have high heels. Have rubber bottoms. Are comfortable and fit you well. Are closed at the toe. Do not wear sandals. If you use a stepladder: Make sure that it is fully opened. Do not climb a closed stepladder. Make sure that both sides of the stepladder are locked into place. Ask someone to hold it for you, if possible. Clearly mark and make sure that you can see: Any grab bars or handrails. First and last steps. Where the edge of each step is. Use tools that help you move around (mobility aids) if they are  needed. These include: Canes. Walkers. Scooters. Crutches. Turn on the lights when you go into a dark area. Replace any light bulbs as soon as they burn out. Set up your furniture so you have a clear path. Avoid moving your furniture around. If any of your floors are uneven, fix them. If there are any pets around you, be aware of where they are. Review your medicines with your doctor. Some medicines can make you feel dizzy. This can increase your chance of falling. Ask your doctor what other things that you can do to help prevent falls. This information is not intended to replace advice given to you by your health care provider. Make sure you discuss any questions you have with your health care provider. Document Released: 01/19/2009 Document Revised: 08/31/2015 Document Reviewed: 04/29/2014 Elsevier Interactive Patient Education  2017 [employer].

## 2022-01-07 NOTE — Progress Notes (Signed)
Subjective:   Tony Fowler is a 76 y.o. male who presents for Medicare Annual/Subsequent preventive examination.  I connected with  [name] on 01/07/22 by a telephone enabled telemedicine application and verified that I am speaking with the correct person using two identifiers.   I discussed the limitations of evaluation and management by telemedicine. The patient expressed understanding and agreed to proceed.  Patient location: home  Provider location:  Tele-health-home  .   Review of Systems     Cardiac Risk Factors include: advanced age (>64men, >52 women);family history of premature cardiovascular disease;male gender     Objective:    Today's Vitals   01/07/22 0905  PainSc: 2    There is no height or weight on file to calculate BMI.     01/07/2022    9:10 AM 01/05/2021    9:00 AM 01/03/2020    9:02 AM 12/23/2018    8:14 AM 06/25/2018    5:37 PM 06/13/2018    6:20 PM 06/13/2018    3:25 PM  Advanced Directives  Does Patient Have a Medical Advance Directive? Yes Yes Yes Yes No No No  Type of Estate agent of [employer] Power of [place];Living will Healthcare Power of [place];Living will Living will;Healthcare Power of Attorney     Copy of Healthcare Power of Attorney in Chart? No - copy requested No - copy requested No - copy requested No - copy requested     Would patient like information on creating a medical advance directive?      Yes (Inpatient - patient requests chaplain consult to create a medical advance directive) No - Patient declined    Current Medications (verified) Outpatient Encounter Medications as of 01/07/2022  Medication Sig   ARIPiprazole (ABILIFY) 5 MG tablet Take 5 mg by mouth daily.   aspirin EC 81 MG tablet Take 81 mg by mouth daily.   carbamazepine (TEGRETOL) 200 MG tablet Take 200 mg by mouth 3 (three) times daily.   gabapentin (NEURONTIN) 300 MG capsule Take by mouth.   Multiple Vitamin (MULTIVITAMIN)  tablet Take 1 tablet by mouth daily.   omeprazole (PRILOSEC) 40 MG capsule Take 1 capsule (40 mg total) by mouth daily.   propranolol (INDERAL) 10 MG tablet Take 10 mg by mouth as needed (Take 1 tablet (10 mg total) by mouth Once PRN for up to 1 dose.).   propranolol (INDERAL) 10 MG tablet Take 1 tablet (10 mg total) by mouth 3 (three) times daily.   rosuvastatin (CRESTOR) 40 MG tablet Take 1 tablet (40 mg total) by mouth daily.   tamsulosin (FLOMAX) 0.4 MG CAPS capsule Take 1 capsule (0.4 mg total) by mouth daily.   gabapentin (NEURONTIN) 300 MG capsule Take 1 capsule by mouth in the morning, at noon, in the evening, and at bedtime.   No facility-administered encounter medications on file as of 01/07/2022.    Allergies (verified) Penicillins   History: Past Medical History:  Diagnosis Date   Bipolar 1 disorder (HCC)    Coarse tremors    Hypertension    Past Surgical History:  Procedure Laterality Date   TONSILECTOMY/ADENOIDECTOMY WITH MYRINGOTOMY     Family History  Problem Relation Age of Onset   Heart disease Mother    Hypertension Mother    Mental illness Mother    Heart disease Brother    Hypertension Brother    Social History   Socioeconomic History   Marital status: Divorced    Spouse name: Not on  file   Number of children: Not on file   Years of education: Not on file   Highest education level: Not on file  Occupational History   Not on file  Tobacco Use   Smoking status: Former    Packs/day: 0.50    Years: 0.00    Total pack years: 0.00    Types: Cigarettes    Quit date: 06/13/2018    Years since quitting: 3.5   Smokeless tobacco: Never  Vaping Use   Vaping Use: Never used  Substance and Sexual Activity   Alcohol use: No    Alcohol/week: 0.0 standard drinks of alcohol    Comment: 1 beer occasionally    Drug use: No   Sexual activity: Not Currently  Other Topics Concern   Not on file  Social History Narrative   Not on file   Social Determinants of  Health   Financial Resource Strain: Low Risk  (01/07/2022)   Overall Financial Resource Strain (CARDIA)    Difficulty of Paying Living Expenses: Not hard at all  Food Insecurity: No Food Insecurity (01/07/2022)   Hunger Vital Sign    Worried About Running Out of Food in the Last Year: Never true    Ran Out of Food in the Last Year: Never true  Transportation Needs: No Transportation Needs (01/07/2022)   PRAPARE - Administrator, Civil Service (Medical): No    Lack of Transportation (Non-Medical): No  Physical Activity: Insufficiently Active (01/07/2022)   Exercise Vital Sign    Days of Exercise per Week: 2 days    Minutes of Exercise per Session: 20 min  Stress: No Stress Concern Present (01/07/2022)   [employer] of Occupational Health - Occupational Stress Questionnaire    Feeling of Stress : Not at all  Social Connections: Moderately Isolated (01/07/2022)   Social Connection and Isolation Panel [NHANES]    Frequency of Communication with Friends and Family: More than three times a week    Frequency of Social Gatherings with Friends and Family: More than three times a week    Attends Religious Services: 1 to 4 times per year    Active Member of [employer] or Organizations: No    Attends Banker Meetings: Never    Marital Status: Widowed    Tobacco Counseling Counseling given: Not Answered   Clinical Intake:  Pre-visit preparation completed: Yes  Pain : No/denies pain Pain Score: 2      Diabetes: No  How often do you need to have someone help you when you read instructions, pamphlets, or other written materials from your doctor or pharmacy?: 1 - Never  Diabetic?  no  Interpreter Needed?: No  Information entered by :: Remi Haggard LPN   Activities of Daily Living    01/07/2022    9:14 AM  In your present state of health, do you have any difficulty performing the following activities:  Hearing? 0  Vision? 0  Difficulty concentrating or  making decisions? 0  Walking or climbing stairs? 0  Dressing or bathing? 0  Doing errands, shopping? 0  Preparing Food and eating ? N  Using the Toilet? N  In the past six months, have you accidently leaked urine? N  Do you have problems with loss of bowel control? N  Managing your Medications? N  Managing your Finances? N  Housekeeping or managing your Housekeeping? N    Patient Care Team: Loura Pardon, MD (Inactive) as PCP - General (Internal Medicine) Minor, Ma Rings  S, RN (Inactive) as [place] any recent [place] you may have received from other than [facility] providers in the past year (date may be approximate).     Assessment:   This is a routine wellness examination for [place].  Hearing/Vision screen Hearing Screening - Comments:: No trouble hearing Vision Screening - Comments:: Not up to date  Dietary issues and exercise activities discussed: Current Exercise Habits: Home exercise routine, Type of exercise: walking;stretching, Time (Minutes): 20, Frequency (Times/Week): 3, Weekly Exercise (Minutes/Week): 60, Intensity: Mild   Goals Addressed             This Visit's Progress    Patient Stated       Continue current lifestyle        Depression Screen    01/07/2022    9:14 AM 01/07/2022    9:13 AM 11/22/2021   10:48 AM 09/26/2021    9:30 AM 03/21/2021    9:18 AM 01/05/2021    9:01 AM 06/07/2020    9:27 AM  PHQ 2/9 Scores  PHQ - 2 Score 0 0 0 0 0 0 0  PHQ- 9 Score 0 0 0 0 0  0    Fall Risk    01/07/2022    9:23 AM 11/22/2021   10:48 AM 09/26/2021    9:30 AM 03/21/2021    9:18 AM 01/05/2021    9:01 AM  Fall Risk   Falls in the past year? 0 0 0 0 0  Number falls in past yr: 0 0 0 0   Injury with Fall? 0 0 0 0   Risk for fall due to :  Impaired mobility No Fall Risks No Fall Risks Medication side effect  Follow up Falls evaluation completed;Education provided;Falls prevention discussed Falls evaluation completed  Falls evaluation completed Falls evaluation completed Falls evaluation completed;Education provided;Falls prevention discussed    FALL RISK PREVENTION PERTAINING TO THE HOME:  Any stairs in or around the home? No  If so, are there any without handrails? No  Home free of loose throw rugs in walkways, pet beds, electrical cords, etc? Yes  Adequate lighting in your home to reduce risk of falls? Yes   ASSISTIVE DEVICES UTILIZED TO PREVENT FALLS:  Life alert? No  Use of a cane, walker or w/c? Yes  Grab bars in the bathroom? Yes  Shower chair or bench in shower? Yes  Elevated toilet seat or a handicapped toilet? No   TIMED UP AND GO:  Was the test performed? No .    Cognitive Function:        01/07/2022    9:09 AM 01/05/2021    9:02 AM 01/03/2020    9:04 AM 12/23/2018    8:14 AM 06/25/2017    8:58 AM  6CIT Screen  What Year? 0 points 0 points 0 points 0 points 0 points  What month? 0 points 0 points 0 points 0 points 0 points  What time? 0 points 0 points 0 points 0 points 0 points  Count back from 20 0 points 0 points 0 points 0 points 0 points  Months in reverse 0 points 0 points 0 points 0 points 0 points  Repeat phrase 0 points 2 points 2 points 0 points 0 points  Total Score 0 points 2 points 2 points 0 points 0 points    Immunizations Immunization History  Administered Date(s) Administered   Fluad Quad(high Dose 65+) 12/23/2018, 12/08/2019, 12/25/2020   Influenza Split 01/08/2008  Influenza, High Dose Seasonal PF 05/16/2016, 01/02/2017, 01/08/2018   Influenza,inj,Quad PF,6+ Mos 02/22/2015   Influenza-Unspecified 02/16/2014, 02/22/2015   PFIZER(Purple Top)SARS-COV-2 Vaccination 05/21/2019, 06/11/2019, 01/24/2020, 07/11/2020   PPD Test 06/18/2018   Pneumococcal Conjugate-13 02/22/2015   Pneumococcal-Unspecified 01/15/2012   Td 12/14/2012    TDAP status: Up to date  Flu Vaccine status: Due, Education has been provided regarding the importance of this vaccine.  Advised may receive this vaccine at local pharmacy or Health Dept. Aware to provide a copy of the vaccination record if obtained from local pharmacy or Health Dept. Verbalized acceptance and understanding.  Pneumococcal vaccine status: Up to date  Covid-19 vaccine status: Information provided on how to obtain vaccines.   Qualifies for Shingles Vaccine? No   Zostavax completed No   Shingrix Completed?: No.    Education has been provided regarding the importance of this vaccine. Patient has been advised to call insurance company to determine out of pocket expense if they have not yet received this vaccine. Advised may also receive vaccine at local pharmacy or Health Dept. Verbalized acceptance and understanding.  Screening Tests Health Maintenance  Topic Date Due   COLON CANCER SCREENING ANNUAL FOBT  Never done   INFLUENZA VACCINE  11/06/2021   COVID-19 Vaccine (5 - Pfizer risk series) 01/23/2022 (Originally 09/05/2020)   Zoster Vaccines- Shingrix (1 of 2) 04/09/2022 (Originally 01/10/1965)   TETANUS/TDAP  12/15/2022   Hepatitis C Screening  Completed   HPV VACCINES  Aged Out   Pneumonia Vaccine 42+ Years old  Discontinued   COLONOSCOPY (Pts 45-50yrs Insurance coverage will need to be confirmed)  Discontinued    Health Maintenance  Health Maintenance Due  Topic Date Due   COLON CANCER SCREENING ANNUAL FOBT  Never done   INFLUENZA VACCINE  11/06/2021    Colonoscopy/Cologuard declined  Lung Cancer Screening: (Low Dose CT Chest recommended if Age 59-80 years, 30 pack-year currently smoking OR have quit w/in 15years.) does not qualify.   Lung Cancer Screening Referral:   Additional Screening:  Hepatitis C Screening: does not qualify; Completed 2018  Vision Screening: Recommended annual ophthalmology exams for early detection of glaucoma and other disorders of the eye. Is the patient up to date with their annual eye exam?  No  Who is the provider or what is the name of the office  in which the patient attends annual eye exams?  If pt is not established with a provider, would they like to be referred to a provider to establish care? No .   Dental Screening: Recommended annual dental exams for proper oral hygiene  Community Resource Referral / Chronic Care Management: CRR required this visit?  No   CCM required this visit?  No      Plan:     I have personally reviewed and noted the following in the patient's chart:   Medical and social history Use of alcohol, tobacco or illicit drugs  Current medications and supplements including opioid prescriptions. Patient is not currently taking opioid prescriptions. Functional ability and status Nutritional status Physical activity Advanced directives List of other physicians Hospitalizations, surgeries, and ER visits in previous 12 months Vitals Screenings to include cognitive, depression, and falls Referrals and appointments  In addition, I have reviewed and discussed with patient certain preventive protocols, quality metrics, and best practice recommendations. A written personalized care plan for preventive services as well as general preventive health recommendations were provided to patient.     Remi Haggard, LPN   [id number]   Nurse  Notes:

## 2022-01-16 NOTE — Progress Notes (Signed)
Psychiatric Initial Adult Assessment   Patient Identification: Tony Fowler MRN:  [id number] Date of Evaluation:  01/22/2022 Referral Source: Charlynne Cousins, MD   Chief Complaint:   Chief Complaint  Patient presents with   Establish Care   Visit Diagnosis:    ICD-10-CM   1. Bipolar I disorder ([place])  F31.9       History of Present Illness:   Tony Fowler is a 76 y.o. year old male with a history of bipolar I disorder, hypertension, GERD, CKD stage III, dug induced tremor , who is referred for bipolar I disorder.  - per chart review, he was admitted to [facility] in 2017 "According to the IVC paperwork, patient was threatening his daughters because they would not give him cigarettes.  Pt states that he decided to change his will and his daughters became angry towards him and would not give him his cigarettes out of spite." Diagnosis includes Bipolar affective disorder, manic, severe, with psychotic behavior ([place]).  - he was seen by Dr. Manuella Ghazi for "Right hemibody Parkinsonism and tremors in right upper extremity with motor distraction. Previously thought that motor changes were limited to drug induced in patient with history of Lithium,Thorazine, Depakote use now on Abilify + some components of right hemibody parkinsonism + components of benign essential tremor - improved"  Patient says he has been seeing Dr. Zollie Scale. Says that he has an appointment in October. He has been hospitalized at least 5 times in the past for what appears to be mania. He denies any history suicidal attempts or self-injurious behaviors.   He states that he was seen by the provider at [facility] for more than 10 years for bipolar disorder.  He liked it that the provider trusted the patient to handle their symptoms instead of being paternalistic.  He believes he has been doing very well for 6 years since being on the current medication.  He enjoys taking care of his great granddaughter, who is 29-year-old.  He states that his  father lived up to his 40s, and he is hoping to live that age.  He hopes to help his children, grandchildren and great grandchildren as much as he can.  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  He states that his wife left the relationship due to his bipolar disorder.  Although he will he is not in relationship, he is a "friendly person," and reports good memories with ladies when he was in [place].   Bipolar disorder-he accepted his diagnosis when he was 76 year old.  He had "psychotic break "and could not work.  He used to work in [employer].  His symptoms usually starts with insomnia, which lasts up to a few days along with racing thoughts.  He tends to have fantasy, and gets lost in touch with reality.  He denies any history of hallucinations or violence.  He has not experienced any decreased need for sleep, euphoria, increased goal directed activity for the past several years.   Hand tremors-he reports good control of his tremor since being on gabapentin and propranolol.  He denies any other movement issues.   Substance-he drinks a 6 pack a year.  He denies drug use.  He quit smoking since 2021.   Home BP- 135/86.  He reports pattern of white coat hypertension.   Household: daughter (49), her fiance Marital status: divorced in 52 (he  states that his wife left due to his bipolar disorder.  She was recently found to have uterine cancer.) Number of children: 2 daughters Employment: SSD due to bipolar disorder Education: graduate school for one year, [place] and [place]. 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 He moved to [place] in 2010 to live close to his two daughters.   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  Consequences of Substance Abuse: NA  Past Medical History:  Past Medical History:  Diagnosis Date   Bipolar 1 disorder ([place])    Coarse tremors    Hypertension     Past Surgical History:  Procedure Laterality Date   TONSILECTOMY/ADENOIDECTOMY WITH MYRINGOTOMY      Family Psychiatric History: as below  Family History:  Family History  Problem Relation Age of Onset   Schizophrenia Mother    Bipolar disorder Mother    Heart disease Mother    Hypertension Mother    Mental illness Mother    Heart disease Brother    Hypertension Brother     Social History:   Social History   Socioeconomic History   Marital status: Divorced    Spouse name: Not on file   Number of children: 2   Years of education: Not on file   Highest education level: Bachelor's degree (e.g., BA, AB, BS)  Occupational History   Not on file  Tobacco Use   Smoking status: Former    Packs/day: 0.50    Years: 0.00    Total pack years: 0.00    Types: Cigarettes    Quit date: 06/13/2018    Years since quitting: 3.6   Smokeless tobacco: Never  Vaping Use   Vaping Use: Never used  Substance and Sexual Activity   Alcohol use: No    Alcohol/week: 0.0 standard drinks of alcohol    Comment: 1 beer occasionally    Drug use: No   Sexual activity: Not Currently  Other Topics Concern   Not on file  Social History Narrative   Not on file   Social Determinants of Health    Financial Resource Strain: Low Risk  (01/07/2022)   Overall Financial Resource Strain (CARDIA)    Difficulty of Paying Living Expenses: Not hard at all  Food Insecurity: No Food Insecurity (01/07/2022)   Hunger Vital Sign    Worried About Running Out of Food in the Last Year: Never true    Ran Out of Food in the Last Year: Never true  Transportation Needs: No Transportation Needs (01/07/2022)   PRAPARE - Hydrologist (Medical): No    Lack of Transportation (Non-Medical): No  Physical Activity: Insufficiently Active (01/07/2022)   Exercise Vital Sign    Days of Exercise per Week: 2 days    Minutes of Exercise per Session: 20  min  Stress: No Stress Concern Present (01/07/2022)   [place]    Feeling of Stress : Not at all  Social Connections: Moderately Isolated (01/07/2022)   Social Connection and Isolation Panel [NHANES]    Frequency of Communication with Friends and Family: More than three times a week    Frequency of Social Gatherings with Friends and Family: More than three times a week    Attends Religious Services: 1 to 4 times per year    Active Member of [employer] or Organizations: No    Attends Archivist Meetings: Never    Marital Status: Widowed    Additional Social History: Please see initial evaluation for full details. I have reviewed the history. No updates at this time.     Allergies:   Allergies  Allergen Reactions   Penicillins Rash    Childhood allergy, more than 30 years ago. No hospital required.    Metabolic Disorder Labs: Lab Results  Component Value Date   HGBA1C 5.8 (H) 09/26/2021   Lab Results  Component Value Date   PROLACTIN 10.4 09/26/2021   Lab Results  Component Value Date   CHOL 187 09/26/2021   TRIG 182 (H) 09/26/2021   HDL 54 09/26/2021   CHOLHDL 3.5 03/14/2021   VLDL 18 01/02/2017   LDLCALC 102 (H) 09/26/2021   LDLCALC 119 (H)  03/14/2021   Lab Results  Component Value Date   TSH 1.020 03/14/2021    Therapeutic Level Labs: Lab Results  Component Value Date   LITHIUM 0.61 12/08/2015   No results found for: "CBMZ" Lab Results  Component Value Date   VALPROATE 43 (L) 06/25/2017    Current Medications: Current Outpatient Medications  Medication Sig Dispense Refill   aspirin EC 81 MG tablet Take 81 mg by mouth daily.     gabapentin (NEURONTIN) 300 MG capsule Take by mouth.     Multiple Vitamin (MULTIVITAMIN) tablet Take 1 tablet by mouth daily.     omeprazole (PRILOSEC) 40 MG capsule Take 1 capsule (40 mg total) by mouth daily. 90 capsule 4   propranolol (INDERAL) 10 MG tablet Take 10 mg by mouth as needed (Take 1 tablet (10 mg total) by mouth Once PRN for up to 1 dose.).     propranolol (INDERAL) 10 MG tablet Take 1 tablet (10 mg total) by mouth 3 (three) times daily. 90 tablet 13   rosuvastatin (CRESTOR) 40 MG tablet Take 1 tablet (40 mg total) by mouth daily. 90 tablet 4   tamsulosin (FLOMAX) 0.4 MG CAPS capsule Take 1 capsule (0.4 mg total) by mouth daily. 90 capsule 4   ARIPiprazole (ABILIFY) 5 MG tablet Take 1 tablet (5 mg total) by mouth daily. 30 tablet 1   carbamazepine (TEGRETOL) 200 MG tablet Take 1 tablet (200 mg total) by mouth 3 (three) times daily. 90 tablet 1   gabapentin (NEURONTIN) 300 MG capsule Take 1 capsule by mouth in the morning, at noon, in the evening, and at bedtime.     No current facility-administered medications for this visit.    Musculoskeletal: Strength & Muscle Tone: within normal limits Gait & Station:  uses a cane Patient leans: N/A  Psychiatric Specialty Exam: Review of Systems  Psychiatric/Behavioral:  Negative for agitation, behavioral problems, confusion, decreased concentration, dysphoric mood, hallucinations, self-injury, sleep disturbance and suicidal ideas. The patient is not nervous/anxious and is not hyperactive.   All other systems reviewed and are  negative.  Blood pressure (!) 212/110, pulse 80, temperature 98.9 F (37.2 C), temperature source Oral, height 5\' 6"  (1.676 m), weight 168 lb (76.2 kg).Body mass index is 27.12 kg/m.  General Appearance: Fairly Groomed  Eye Contact:  Good  Speech:  Clear and Coherent  Volume:  Normal  Mood:   good  Affect:  Appropriate, Congruent, and Full Range  Thought Process:  Coherent  Orientation:  Full (Time, Place, and Person)  Thought Content:  Logical  Suicidal Thoughts:  No  Homicidal Thoughts:  No  Memory:  Immediate;   Good  Judgement:  Good  Insight:  Good  Psychomotor Activity:  Normal  Concentration:  Concentration: Good and Attention Span: Good  Recall:  Good  Fund of Knowledge:Good  Language: Good  Akathisia:  No  Handed:  Right  AIMS (if indicated):  not done  Assets:  Communication Skills Desire for Improvement  ADL's:  Intact  Cognition: WNL  Sleep:  Good   Screenings: AIMS    Flowsheet Row Admission (Discharged) from 12/01/2015 in [place] Total Score 0      AUDIT    [place] Admission (Discharged) from 12/01/2015 in [place]  Alcohol Use Disorder Identification Test Final Score (AUDIT) 0      GAD-7    Flowsheet Row Office Visit from 01/22/2022 in [place] Office Visit from 11/22/2021 in [place] Visit from 09/26/2021 in [place] Visit from 03/21/2021 in [facility]  Total GAD-7 Score 0 0 0 0      PHQ2-9    [place] Visit from 01/22/2022 in [place] from 01/07/2022 in [place] Visit from 11/22/2021 in [place] Visit from 09/26/2021 in [place] Visit from 03/21/2021 in [place]  PHQ-2 Total Score 0 0 0 0 0  PHQ-9 Total Score 0 0 0 0 0      [place]  Visit from 01/22/2022 in [place] Error: Q3, 4, or 5 should not be populated when Q2 is No       Assessment and Plan:  Tony Fowler is a 76 y.o. year old male with a history of bipolar I disorder, hypertension, GERD, CKD stage III, dug induced tremor , who is referred for bipolar I disorder.   1. Bipolar I disorder ([place]) He denies any significant mood/psychotic symptoms since the last admission in 2017 since being on the current combination of medication.  Psychosocial stressors includes unemployment, divorce.  She reports close relationship with his daughters, grandchildren, and great grandchildren.  He is looking forward to live up to 32's, referring to his father.  Will continue carbamazepine to target bipolar disorder.  He was advised to obtain labs at least every 6 months for monitoring.  Will continue Abilify to target bipolar disorder.  Discussed potential metabolic side effect, EPS.  Noted that he has been on propranolol, gabapentin for tremors.  He does not have obvious EPS on today's exam, although he does have postural tremors.  Will continue to monitor this.   # Hypertension He has remarkable hypertension on today's evaluation.  Not in acute distress. His home BP is reportedly noted to be in normal range.  He was advised to contact his provider about this if his blood pressure remains elevated.   Plan Continue carbamazepine 200 mg three times a day  Continue  Abilify 5 mg daily  Next appointment: 12/14 at 3:30, in person - CBC, LFT wnl in June 2023 (he would like to be rechecked by his PCP) -on  propranolol 10 mg three times a day, gabapentin 300  three times a day  The patient demonstrates the following risk factors for suicide: Chronic risk factors for suicide include: psychiatric disorder of bipolar disorder . Acute risk factors for suicide include: unemployment. Protective factors for this patient include: positive social  support, responsibility to others (children, family), coping skills, and hope for the future. Considering these factors, the overall suicide risk at this point appears to be low. Patient is appropriate for outpatient follow up.   Collaboration of Care: Other reviewed notes in Epic  Patient/Guardian was advised Release of Information must be obtained prior to any record release in order to collaborate their care with an outside provider. Patient/Guardian was advised if they have not already done so to contact the registration department to sign all necessary forms in order for [place] to release information regarding their care.   Consent: Patient/Guardian gives verbal consent for treatment and assignment of benefits for services provided during this visit. Patient/Guardian expressed understanding and agreed to proceed.   Norman Clay, MD 10/17/20234:07 PM

## 2022-01-22 ENCOUNTER — Ambulatory Visit (INDEPENDENT_AMBULATORY_CARE_PROVIDER_SITE_OTHER): Payer: Medicare Other | Admitting: Psychiatry

## 2022-01-22 ENCOUNTER — Encounter: Payer: Self-pay | Admitting: Psychiatry

## 2022-01-22 VITALS — BP 212/110 | HR 80 | Temp 98.9°F | Ht 66.0 in | Wt 168.0 lb

## 2022-01-22 DIAGNOSIS — F319 Bipolar disorder, unspecified: Secondary | ICD-10-CM | POA: Diagnosis not present

## 2022-01-22 MED ORDER — ARIPIPRAZOLE 5 MG PO TABS
5.0000 mg | ORAL_TABLET | Freq: Every day | ORAL | 1 refills | Status: DC
Start: 2022-01-22 — End: 2022-03-21

## 2022-01-22 MED ORDER — CARBAMAZEPINE 200 MG PO TABS: 200.0000 mg | ORAL_TABLET | Freq: Three times a day (TID) | ORAL | 1 refills | Status: DC

## 2022-01-22 NOTE — Patient Instructions (Signed)
Continue carbamazepine 200 mg three times a day  Continue Abilify 5 mg daily  Next appointment: 12/14 at 3:30

## 2022-01-28 DIAGNOSIS — Z23 Encounter for immunization: Secondary | ICD-10-CM | POA: Diagnosis not present

## 2022-02-06 DIAGNOSIS — Z789 Other specified health status: Secondary | ICD-10-CM | POA: Diagnosis not present

## 2022-02-06 DIAGNOSIS — J3489 Other specified disorders of nose and nasal sinuses: Secondary | ICD-10-CM | POA: Diagnosis not present

## 2022-03-20 NOTE — Progress Notes (Unsigned)
BH MD/PA/NP OP Progress Note  03/21/2022 4:02 PM Kerolos Nehme  MRN:  [id number]  Chief Complaint:  Chief Complaint  Patient presents with   Follow-up   HPI:  This is a follow-up appointment for bipolar disorder.  He states that he has been doing good.  He bought gifts for his daughters and grandchildren.  He thinks his mood is stable.  He does not have ups and down.  He wants to make the best of it, and want his family to have good memory of him.  He takes a walk with his dog.  He cooks dinner. When he was asked about his blood pressure, he states that it gets high on any doctor's visit.  He thinks it might be related to the anxiety as he was IVC in the past, and felt traumatized.  He felt he did not need it at that time, although he needed it in retrospect.  He states that his daughter would notify him if any changes/concerns about him, and he has not heard back from her.   He sleeps 5.5 hour on most days.  He denies feeling depressed or anxiety.  He denies SI, HI.  He denies hallucinations.  He denies paranoia.  He denies alcohol use or drug use.  He feels comfortable to stay on this medication.    Was 155 lbs when he moved 15 years ago  Wt Readings from Last 3 Encounters:  03/21/22 171 lb 3.2 oz (77.7 kg)  01/22/22 168 lb (76.2 kg)  11/22/21 167 lb 8 oz (76 kg)     Household: daughter (11), her fiance Marital status: divorced in 1991 (he states that his wife left due to his bipolar disorder.  She was recently found to have uterine cancer.) Number of children: 2 daughters, grandchildren (age 27-33 in 2023) Employment: [employer] due to bipolar disorder Education: graduate school for one year, [place] and [place]. 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 He moved to [place] in 2010 to live close to his two daughters.   Visit Diagnosis:    ICD-10-CM   1. Bipolar I disorder (HCC)  F31.9        Past Psychiatric History: Please see initial evaluation for full details. I have reviewed the history. No updates at this time.     Past Medical History:  Past Medical History:  Diagnosis Date   Bipolar 1 disorder (HCC)    Coarse tremors    Hypertension     Past Surgical History:  Procedure Laterality Date   TONSILECTOMY/ADENOIDECTOMY WITH MYRINGOTOMY      Family Psychiatric History: Please see initial evaluation for full details. I have reviewed the history. No updates at this time.     Family History:  Family History  Problem Relation Age of Onset   Schizophrenia Mother    Bipolar disorder Mother    Heart disease Mother    Hypertension Mother    Mental illness Mother    Heart disease Brother    Hypertension Brother     Social History:  Social History   Socioeconomic History   Marital status: Divorced    Spouse name: Not on file   Number of children: 2   Years of education: Not on file   Highest education level: Bachelor's degree (e.g., BA, AB, BS)  Occupational History   Not on file  Tobacco  Use   Smoking status: Former    Packs/day: 0.50    Years: 0.00    Total pack years: 0.00    Types: Cigarettes    Quit date: 06/13/2018    Years since quitting: 3.7   Smokeless tobacco: Never  Vaping Use   Vaping Use: Never used  Substance and Sexual Activity   Alcohol use: No    Alcohol/week: 0.0 standard drinks of alcohol    Comment: 1 beer occasionally    Drug use: No   Sexual activity: Not Currently  Other Topics Concern   Not on file  Social History Narrative   Not on file   Social Determinants of Health   Financial Resource Strain: Low Risk  (01/07/2022)   Overall Financial Resource Strain (CARDIA)    Difficulty of Paying Living Expenses: Not hard at all  Food Insecurity: No Food Insecurity (01/07/2022)   Hunger Vital Sign    Worried About Running Out of Food in the Last Year: Never true    Ran Out of Food in the Last Year: Never true   Transportation Needs: No Transportation Needs (01/07/2022)   PRAPARE - Administrator, Civil Service (Medical): No    Lack of Transportation (Non-Medical): No  Physical Activity: Insufficiently Active (01/07/2022)   Exercise Vital Sign    Days of Exercise per Week: 2 days    Minutes of Exercise per Session: 20 min  Stress: No Stress Concern Present (01/07/2022)   [employer] of Occupational Health - Occupational Stress Questionnaire    Feeling of Stress : Not at all  Social Connections: Moderately Isolated (01/07/2022)   Social Connection and Isolation Panel [NHANES]    Frequency of Communication with Friends and Family: More than three times a week    Frequency of Social Gatherings with Friends and Family: More than three times a week    Attends Religious Services: 1 to 4 times per year    Active Member of [employer] or Organizations: No    Attends Banker Meetings: Never    Marital Status: Widowed    Allergies:  Allergies  Allergen Reactions   Penicillins Rash    Childhood allergy, more than 30 years ago. No hospital required.    Metabolic Disorder Labs: Lab Results  Component Value Date   HGBA1C 5.8 (H) 09/26/2021   Lab Results  Component Value Date   PROLACTIN 10.4 09/26/2021   Lab Results  Component Value Date   CHOL 187 09/26/2021   TRIG 182 (H) 09/26/2021   HDL 54 09/26/2021   CHOLHDL 3.5 03/14/2021   VLDL 18 01/02/2017   LDLCALC 102 (H) 09/26/2021   LDLCALC 119 (H) 03/14/2021   Lab Results  Component Value Date   TSH 1.020 03/14/2021   TSH 1.140 12/25/2020    Therapeutic Level Labs: Lab Results  Component Value Date   LITHIUM 0.61 12/08/2015   LITHIUM 0.54 (L) 12/05/2015   Lab Results  Component Value Date   VALPROATE 43 (L) 06/25/2017   VALPROATE 94 04/20/2016   No results found for: "CBMZ"  Current Medications: Current Outpatient Medications  Medication Sig Dispense Refill   aspirin EC 81 MG tablet Take 81 mg by  mouth daily.     gabapentin (NEURONTIN) 300 MG capsule Take by mouth.     Multiple Vitamin (MULTIVITAMIN) tablet Take 1 tablet by mouth daily.     Multiple Vitamins-Minerals (ONE DAILY CALCIUM/IRON) TABS Take by mouth.     omeprazole (PRILOSEC) 40 MG capsule  Take 1 capsule (40 mg total) by mouth daily. 90 capsule 4   propranolol (INDERAL) 10 MG tablet Take 10 mg by mouth as needed (Take 1 tablet (10 mg total) by mouth Once PRN for up to 1 dose.).     propranolol (INDERAL) 10 MG tablet Take 1 tablet (10 mg total) by mouth 3 (three) times daily. 90 tablet 13   rosuvastatin (CRESTOR) 20 MG tablet Take by mouth.     tamsulosin (FLOMAX) 0.4 MG CAPS capsule Take 1 capsule (0.4 mg total) by mouth daily. 90 capsule 4   [START ON 03/23/2022] ARIPiprazole (ABILIFY) 5 MG tablet Take 1 tablet (5 mg total) by mouth daily. 30 tablet 3   [START ON 03/23/2022] carbamazepine (TEGRETOL) 200 MG tablet Take 1 tablet (200 mg total) by mouth 3 (three) times daily. 90 tablet 5   gabapentin (NEURONTIN) 300 MG capsule Take 1 capsule by mouth in the morning, at noon, in the evening, and at bedtime.     No current facility-administered medications for this visit.     Musculoskeletal: Strength & Muscle Tone: within normal limits Gait & Station:  uses a cane Patient leans: N/A  Psychiatric Specialty Exam: Review of Systems  Psychiatric/Behavioral: Negative.    All other systems reviewed and are negative.   Blood pressure (!) 186/112, pulse 71, temperature 97.9 F (36.6 C), temperature source Oral, height 5\' 6"  (1.676 m), weight 171 lb 3.2 oz (77.7 kg), SpO2 99 %.Body mass index is 27.63 kg/m.  General Appearance: Fairly Groomed  Eye Contact:  Good  Speech:  Clear and Coherent  Volume:  Normal  Mood:   good  Affect:  Appropriate, Congruent, and calm  Thought Process:  Coherent  Orientation:  Full (Time, Place, and Person)  Thought Content: Logical   Suicidal Thoughts:  No  Homicidal Thoughts:  No  Memory:   Immediate;   Good  Judgement:  Good  Insight:  Good  Psychomotor Activity:  Normal  Concentration:  Concentration: Good and Attention Span: Good  Recall:  Good  Fund of Knowledge: Good  Language: Good  Akathisia:  No  Handed:  Right  AIMS (if indicated): not done  Assets:  Communication Skills Desire for Improvement  ADL's:  Intact  Cognition: WNL  Sleep:  Good   Screenings: AIMS    Flowsheet Row Admission (Discharged) from 12/01/2015 in [facility]  AIMS Total Score 0      AUDIT    Flowsheet Row Admission (Discharged) from 12/01/2015 in [facility]  Alcohol Use Disorder Identification Test Final Score (AUDIT) 0      GAD-7    Flowsheet Row Office Visit from 03/21/2022 in [facility] Office Visit from 01/22/2022 in [facility] Office Visit from 11/22/2021 in [facility] Office Visit from 09/26/2021 in [facility] Office Visit from 03/21/2021 in [facility]  Total GAD-7 Score 0 0 0 0 0      PHQ2-9    Flowsheet Row Office Visit from 03/21/2022 in [facility] Office Visit from 01/22/2022 in [facility] Clinical Support from 01/07/2022 in [facility] Office Visit from 11/22/2021 in [facility] Office Visit from 09/26/2021 in [facility]  PHQ-2 Total Score 0 0 0 0 0  PHQ-9 Total Score 0 0 0 0 0      Flowsheet Row Office Visit from 03/21/2022 in [facility] Office Visit from 01/22/2022 in [facility]  C-SSRS RISK CATEGORY No Risk Error: Q3, 4, or 5 should not be populated when Q2 is No        Assessment and Plan:  Karn CassisHerbert Drapeau is a 76 y.o. year old male with a history of bipolar I disorder (diagnosed at 76 yo), hypertension, GERD, CKD stage III, dug induced tremor, who presents  for follow up appointment for below.  1. Bipolar I disorder (HCC) He denies any significant mood/psychotic symptoms since the last admission on 2017 since being on the current medication regimen. Psychosocial stressors includes unemployment, divorce.  She reports close relationship with his daughters, grandchildren, and great grandchildren.  He is looking forward to live up to 90's, referring to his father.  Will continue current dose of carbamazepine to target bipolar disorder.  He was advised again to obtain labs for monitoring.  Will continue Abilify to target bipolar disorder.  Noted that he has been on propranolol, gabapentin for tremors.  He does not have obvious EPS on today's exam, although he did have postural tremors at the initial visit.  Will continue to monitor this.    # Hypertension He has remarkable hypertension on today's evaluation again.  He is not in acute distress.  His home BP is reportedly noted to being normal range.  He has an upcoming appointment with his PCP; he will discuss this with this provider.   Plan (he asks monthly refill only) Continue carbamazepine 200 mg three times a day  Continue Abilify 5 mg daily (EKG 394 msec 06/2018) - monthly refill Obtain EKG (he would like his PCP to do this) Obtain lab (CBC, LFT) - he would like his PCP to do this Next appointment: 3/18 at 4 PM for 30 mins, IP - CBC, LFT wnl in June 2023 (he would like to be rechecked by his PCP) 1/10 -on  propranolol 10 mg three times a day, gabapentin 300-300-600 mg for tremors   The patient demonstrates the following risk factors for suicide: Chronic risk factors for suicide include: psychiatric disorder of bipolar disorder . Acute risk factors for suicide include: unemployment. Protective factors for this patient include: positive social support, responsibility to others (children, family), coping skills, and hope for the future. Considering these factors, the overall suicide risk at this point  appears to be low. Patient is appropriate for outpatient follow up.         Collaboration of Care: Collaboration of Care: Other reviewed notes in Epic  Patient/Guardian was advised Release of Information must be obtained prior to any record release in order to collaborate their care with an outside provider. Patient/Guardian was advised if they have not already done so to contact the registration department to sign all necessary forms in order for us to release information regarding their care.   Consent: Patient/Guardian gives verbal consent for treatment and assignment of benefits for services provided during this visit. Patient/Guardian expressed understanding and agreed to proceed.    Neysa Hottereina Marquitta Persichetti, MD 03/21/2022, 4:02 PM

## 2022-03-21 ENCOUNTER — Encounter: Payer: Self-pay | Admitting: Psychiatry

## 2022-03-21 ENCOUNTER — Ambulatory Visit (INDEPENDENT_AMBULATORY_CARE_PROVIDER_SITE_OTHER): Payer: Medicare Other | Admitting: Psychiatry

## 2022-03-21 VITALS — BP 186/112 | HR 71 | Temp 97.9°F | Ht 66.0 in | Wt 171.2 lb

## 2022-03-21 DIAGNOSIS — F319 Bipolar disorder, unspecified: Secondary | ICD-10-CM | POA: Diagnosis not present

## 2022-03-21 MED ORDER — ARIPIPRAZOLE 5 MG PO TABS: 5.0000 mg | ORAL_TABLET | Freq: Every day | ORAL | 3 refills | Status: DC

## 2022-03-21 MED ORDER — CARBAMAZEPINE 200 MG PO TABS: 200.0000 mg | ORAL_TABLET | Freq: Three times a day (TID) | ORAL | 5 refills | Status: DC

## 2022-03-21 NOTE — Patient Instructions (Signed)
Continue carbamazepine 200 mg three times a day  Continue Abilify 5 mg daily  Obtain EKG Obtain lab (CBC, LFT)  Next appointment: 3/18 at 4 PM

## 2022-03-22 ENCOUNTER — Telehealth: Payer: Self-pay

## 2022-03-22 NOTE — Telephone Encounter (Signed)
fax and confirmed notice the bp reading for patient. pt was seen on 03-21-22 and bp was 186/112

## 2022-04-15 DIAGNOSIS — R7309 Other abnormal glucose: Secondary | ICD-10-CM | POA: Insufficient documentation

## 2022-04-15 NOTE — Patient Instructions (Signed)

## 2022-04-17 ENCOUNTER — Ambulatory Visit (INDEPENDENT_AMBULATORY_CARE_PROVIDER_SITE_OTHER): Payer: Medicare Other | Admitting: Nurse Practitioner

## 2022-04-17 ENCOUNTER — Encounter: Payer: Self-pay | Admitting: Nurse Practitioner

## 2022-04-17 VITALS — BP 138/82 | HR 69 | Temp 97.3°F | Ht 66.93 in | Wt 168.4 lb

## 2022-04-17 DIAGNOSIS — N1831 Chronic kidney disease, stage 3a: Secondary | ICD-10-CM

## 2022-04-17 DIAGNOSIS — K219 Gastro-esophageal reflux disease without esophagitis: Secondary | ICD-10-CM | POA: Diagnosis not present

## 2022-04-17 DIAGNOSIS — I10 Essential (primary) hypertension: Secondary | ICD-10-CM

## 2022-04-17 DIAGNOSIS — G251 Drug-induced tremor: Secondary | ICD-10-CM

## 2022-04-17 DIAGNOSIS — E782 Mixed hyperlipidemia: Secondary | ICD-10-CM

## 2022-04-17 DIAGNOSIS — Z79899 Other long term (current) drug therapy: Secondary | ICD-10-CM

## 2022-04-17 DIAGNOSIS — R7309 Other abnormal glucose: Secondary | ICD-10-CM | POA: Diagnosis not present

## 2022-04-17 DIAGNOSIS — Z87891 Personal history of nicotine dependence: Secondary | ICD-10-CM

## 2022-04-17 DIAGNOSIS — F3112 Bipolar disorder, current episode manic without psychotic features, moderate: Secondary | ICD-10-CM

## 2022-04-17 DIAGNOSIS — N401 Enlarged prostate with lower urinary tract symptoms: Secondary | ICD-10-CM

## 2022-04-17 DIAGNOSIS — N138 Other obstructive and reflux uropathy: Secondary | ICD-10-CM | POA: Diagnosis not present

## 2022-04-17 LAB — MICROALBUMIN, URINE WAIVED
Creatinine, Urine Waived: 50 mg/dL (ref 10–300)
Microalb, Ur Waived: 80 mg/L — ABNORMAL HIGH (ref 0–19)

## 2022-04-17 MED ORDER — ROSUVASTATIN CALCIUM 20 MG PO TABS: 20.0000 mg | ORAL_TABLET | Freq: Every day | ORAL | 4 refills | Status: DC

## 2022-04-17 NOTE — Assessment & Plan Note (Signed)
Chronic, stable.  Will continue Propranolol, refills sent in, and continue collaboration with neurology as needed.  Recent note reviewed.

## 2022-04-17 NOTE — Assessment & Plan Note (Signed)
Chronic, ongoing.  Remaining stable on recent labs.  Recommend Losartan or alternate ARB addition for kidney protection.  He refuses today.  Recheck urine ALB and CMP.

## 2022-04-17 NOTE — Assessment & Plan Note (Signed)
Chronic, ongoing.  Continue current medication regimen and adjust as needed.  CMP and lipid panel today.  Return in 6 months.

## 2022-04-17 NOTE — Assessment & Plan Note (Signed)
History of elevation in A1c -- will recheck today and monitor.

## 2022-04-17 NOTE — Progress Notes (Signed)
BP 138/82 (BP Location: Left Arm, Patient Position: Sitting, Cuff Size: Normal)   Pulse 69   Temp (!) 97.3 F (36.3 C) (Oral)   Ht 5' 6.93" (1.7 m)   Wt 168 lb 6.4 oz (76.4 kg)   SpO2 97%   BMI 26.43 kg/m    Subjective:    Patient ID: Tony Fowler, male    DOB: 03-31-1946, 77 y.o.   MRN: [id number]  HPI: Tony Fowler is a 77 y.o. male  Chief Complaint  Patient presents with   Hypertension   Hyperlipidemia   Gastroesophageal Reflux   Chronic Kidney Disease   HYPERTENSION / HYPERLIPIDEMIA Continues on Propranolol 10 MG TID (for tremor and BP) and ASA + Crestor 40 MG daily.     He quit smoking on March 7th, 2020 due to pneumonia and hospitalization.  He had smoked since 77 years old, 1 PPD per day.  Declines lung CT cancer screening. Satisfied with current treatment? yes Duration of hypertension: chronic BP monitoring frequency: daily BP range: 135/86 range at home on average -- has white coat (has Omron at home) -- reports white coat due to past involuntary commitment and that event process BP medication side effects: no Duration of hyperlipidemia: chronic Cholesterol medication side effects: no Cholesterol supplements: none Medication compliance: good compliance Aspirin: yes Recent stressors: no Recurrent headaches: no Visual changes: no Palpitations: no Dyspnea: no Chest pain: no Lower extremity edema: no Dizzy/lightheaded: no The 10-year ASCVD risk score (Arnett DK, et al., 2019) is: 30.5%   Values used to calculate the score:     Age: 11 years     Sex: Male     Is Non-Hispanic African American: No     Diabetic: No     Tobacco smoker: No     Systolic Blood Pressure: 563 mmHg     Is BP treated: Yes     HDL Cholesterol: 54 mg/dL     Total Cholesterol: 187 mg/dL  CHRONIC KIDNEY DISEASE CKD status: stable Medications renally dose: yes Previous renal evaluation: no Pneumovax:  Up to Date Influenza Vaccine:   refuses    BPH Continues on Tamsulosin  for BPH.   BPH status: controlled Satisfied with current treatment?: yes Medication side effects: no Medication compliance: good compliance Duration: chronic Nocturia: 1/night Urinary frequency:no Incomplete voiding: no Urgency: no Weak urinary stream: occasional Straining to start stream: no Dysuria: no Onset: gradual Severity: mild Alleviating factors: Flomax  Aggravating factors: none Treatments attempted: Flomax IPSS Questionnaire (AUA-7): 4 AUA Over the past month.   1)  How often have you had a sensation of not emptying your bladder completely after you finish urinating?  0 - Not at all  2)  How often have you had to urinate again less than two hours after you finished urinating? 0 - Not at all  3)  How often have you found you stopped and started again several times when you urinated?  0 - Not at all  4) How difficult have you found it to postpone urination?  0 - Not at all  5) How often have you had a weak urinary stream?  1 - Less than 1 time in 5  6) How often have you had to push or strain to begin urination?  1 - Less than 1 time in 5  7) How many times did you most typically get up to urinate from the time you went to bed until the time you got up in the morning?  2 -  2 times  Total score:  0-7 mildly symptomatic   8-19 moderately symptomatic   20-35 severely symptomatic     GERD Continues on Prilosec 40 MG daily.   GERD control status: controlled  Satisfied with current treatment? yes Heartburn frequency: none Antacid use frequency:  none Dysphagia: no Odynophagia:  no Hematemesis: no Blood in stool: no EGD: no   BIPOLAR AFFECTIVE DISORDER: Is followed by psychiatry and last visit he reports was in 03/21/22 -- Dr. Modesta Messing. Continues on Abilify 5 MG daily and Tegretol 200 MG TID.    Saw neurology for drug-induced tremor, last seen 09/28/20 with plan to continue Gabapentin 300 MG QID and Propranolol 10 MG TID.  To return as needed. Mood status:  stable Satisfied with current treatment?: yes Symptom severity: mild  Duration of current treatment : chronic Side effects: no Medication compliance: good compliance Psychotherapy/counseling: current Depressed mood: no Anxious mood:  occasional , overall stable Anhedonia: no Significant weight loss or gain: no Insomnia: no Fatigue: no Feelings of worthlessness or guilt: no Impaired concentration/indecisiveness: no Suicidal ideations: no Hopelessness: no Crying spells: no    04/17/2022    8:30 AM 03/21/2022    3:11 PM 01/22/2022    2:15 PM 01/07/2022    9:14 AM 01/07/2022    9:13 AM  Depression screen PHQ 2/9  Decreased Interest 0   0 0  Down, Depressed, Hopeless 0   0 0  PHQ - 2 Score 0   0 0  Altered sleeping 0   0 0  Tired, decreased energy 0   0 0  Change in appetite 0   0 0  Feeling bad or failure about yourself  0   0 0  Trouble concentrating 0   0 0  Moving slowly or fidgety/restless 0   0 0  Suicidal thoughts 0   0 0  PHQ-9 Score 0   0 0  Difficult doing work/chores Not difficult at all   Not difficult at all Not difficult at all     Information is confidential and restricted. Go to Review Flowsheets to unlock data.       04/17/2022    8:31 AM 03/21/2022    3:12 PM 01/22/2022    2:16 PM 11/22/2021   10:49 AM  GAD 7 : Generalized Anxiety Score  Nervous, Anxious, on Edge 0   0  Control/stop worrying 0   0  Worry too much - different things 0   0  Trouble relaxing 0   0  Restless 0   0  Easily annoyed or irritable 0   0  Afraid - awful might happen 0   0  Total GAD 7 Score 0   0  Anxiety Difficulty Not difficult at all   Not difficult at all     Information is confidential and restricted. Go to Review Flowsheets to unlock data.     Relevant past medical, surgical, family and social history reviewed and updated as indicated. Interim medical history since our last visit reviewed. Allergies and medications reviewed and updated.  Review of Systems   Constitutional:  Negative for activity change, diaphoresis, fatigue and fever.  Respiratory:  Negative for cough, chest tightness, shortness of breath and wheezing.   Cardiovascular:  Negative for chest pain, palpitations and leg swelling.  Gastrointestinal: Negative.   Endocrine: Negative.   Neurological: Negative.   Psychiatric/Behavioral: Negative.      Per HPI unless specifically indicated above     Objective:  BP 138/82 (BP Location: Left Arm, Patient Position: Sitting, Cuff Size: Normal)   Pulse 69   Temp (!) 97.3 F (36.3 C) (Oral)   Ht 5' 6.93" (1.7 m)   Wt 168 lb 6.4 oz (76.4 kg)   SpO2 97%   BMI 26.43 kg/m   Wt Readings from Last 3 Encounters:  04/17/22 168 lb 6.4 oz (76.4 kg)  11/22/21 167 lb 8 oz (76 kg)  09/26/21 170 lb 12.8 oz (77.5 kg)    Physical Exam Vitals and nursing note reviewed.  Constitutional:      General: He is awake. He is not in acute distress.    Appearance: He is well-developed, well-groomed and overweight. He is not ill-appearing.  HENT:     Head: Normocephalic and atraumatic.     Right Ear: Hearing normal. No drainage.     Left Ear: Hearing normal. No drainage.  Eyes:     General: Lids are normal.        Right eye: No discharge.        Left eye: No discharge.     Conjunctiva/sclera: Conjunctivae normal.     Pupils: Pupils are equal, round, and reactive to light.  Neck:     Thyroid: No thyromegaly.     Vascular: No carotid bruit.     Trachea: Trachea normal.  Cardiovascular:     Rate and Rhythm: Normal rate and regular rhythm.     Heart sounds: Normal heart sounds, S1 normal and S2 normal. No murmur heard.    No gallop.  Pulmonary:     Effort: Pulmonary effort is normal. No accessory muscle usage or respiratory distress.     Breath sounds: Normal breath sounds.  Abdominal:     General: Bowel sounds are normal.     Palpations: Abdomen is soft.  Musculoskeletal:        General: Normal range of motion.     Cervical back:  Normal range of motion and neck supple.     Right lower leg: No edema.     Left lower leg: No edema.  Skin:    General: Skin is warm and dry.     Capillary Refill: Capillary refill takes less than 2 seconds.     Findings: No rash.  Neurological:     Mental Status: He is alert and oriented to person, place, and time.     Deep Tendon Reflexes: Reflexes are normal and symmetric.  Psychiatric:        Attention and Perception: Attention normal.        Mood and Affect: Mood normal.        Speech: Speech normal.        Behavior: Behavior normal. Behavior is cooperative.        Thought Content: Thought content normal.   EKG My review and personal interpretation at Time: 0845   Indication: medication psych Rate: 70  Rhythm: sinus Axis: normal Other: no nonspecific st abn, no stemi, no lvh, QT prolongation  Results for orders placed or performed in visit on 09/26/21  CBC with Differential/Platelet  Result Value Ref Range   WBC 6.8 3.4 - 10.8 x10E3/uL   RBC 4.44 4.14 - 5.80 x10E6/uL   Hemoglobin 14.1 13.0 - 17.7 g/dL   Hematocrit 42.4 37.5 - 51.0 %   MCV 96 79 - 97 fL   MCH 31.8 26.6 - 33.0 pg   MCHC 33.3 31.5 - 35.7 g/dL   RDW 12.9 11.6 - 15.4 %   Platelets  196 150 - 450 x10E3/uL   Neutrophils 65 Not Estab. %   Lymphs 23 Not Estab. %   Monocytes 8 Not Estab. %   Eos 3 Not Estab. %   Basos 1 Not Estab. %   Neutrophils Absolute 4.4 1.4 - 7.0 x10E3/uL   Lymphocytes Absolute 1.6 0.7 - 3.1 x10E3/uL   Monocytes Absolute 0.6 0.1 - 0.9 x10E3/uL   EOS (ABSOLUTE) 0.2 0.0 - 0.4 x10E3/uL   Basophils Absolute 0.0 0.0 - 0.2 x10E3/uL   Immature Granulocytes 0 Not Estab. %   Immature Grans (Abs) 0.0 0.0 - 0.1 x10E3/uL  Comprehensive metabolic panel  Result Value Ref Range   Glucose 103 (H) 70 - 99 mg/dL   BUN 19 8 - 27 mg/dL   Creatinine, Ser 1.30 (H) 0.76 - 1.27 mg/dL   eGFR 58 (L) >59 mL/min/1.73   BUN/Creatinine Ratio 15 10 - 24   Sodium 139 134 - 144 mmol/L   Potassium 4.5 3.5 - 5.2  mmol/L   Chloride 104 96 - 106 mmol/L   CO2 23 20 - 29 mmol/L   Calcium 9.3 8.6 - 10.2 mg/dL   Total Protein 6.4 6.0 - 8.5 g/dL   Albumin 4.2 3.7 - 4.7 g/dL   Globulin, Total 2.2 1.5 - 4.5 g/dL   Albumin/Globulin Ratio 1.9 1.2 - 2.2   Bilirubin Total 0.2 0.0 - 1.2 mg/dL   Alkaline Phosphatase 102 44 - 121 IU/L   AST 29 0 - 40 IU/L   ALT 26 0 - 44 IU/L  Microalbumin, Urine Waived  Result Value Ref Range   Microalb, Ur Waived 10 0 - 19 mg/L   Creatinine, Urine Waived 100 10 - 300 mg/dL   Microalb/Creat Ratio <30 <30 mg/g  Lipid Panel w/o Chol/HDL Ratio  Result Value Ref Range   Cholesterol, Total 187 100 - 199 mg/dL   Triglycerides 182 (H) 0 - 149 mg/dL   HDL 54 >39 mg/dL   VLDL Cholesterol Cal 31 5 - 40 mg/dL   LDL Chol Calc (NIH) 102 (H) 0 - 99 mg/dL  HgB A1c  Result Value Ref Range   Hgb A1c MFr Bld 5.8 (H) 4.8 - 5.6 %   Est. average glucose Bld gHb Est-mCnc 120 mg/dL  Prolactin  Result Value Ref Range   Prolactin 10.4 4.0 - 15.2 ng/mL  Magnesium  Result Value Ref Range   Magnesium 1.9 1.6 - 2.3 mg/dL      Assessment & Plan:   Problem List Items Addressed This Visit       Cardiovascular and Mediastinum   Benign hypertension (Chronic)    Chronic, ongoing.  Initial BP elevated, but repeat improving -- has some know white coat due to PTSD from past involuntary commitment and provider settings.  Home BP's average at goal for age, he checks regularly.  Have recommended adding on a low dose of Losartan, would avoid ACE as suspect some underlying COPD.  He refuses starting new medication today.  Will continue Propranolol for BP and tremor.  Recommend he monitor BP at home daily and document for provider visits + focus on DASH diet.  LABS: CMP, CBC, urine ALB today.  Refills sent in.  Return in 6 months.      Relevant Medications   rosuvastatin (CRESTOR) 20 MG tablet   Other Relevant Orders   CBC with Differential/Platelet   Comprehensive metabolic panel   TSH      Digestive   GERD (gastroesophageal reflux disease)    Chronic,  stable with Protonix.  Continue current regimen and consider trial reduction in future, if tolerated would consider discontinuation.  Risks of PPI use were discussed with patient including bone loss, C. Diff diarrhea, pneumonia, infections, CKD, electrolyte abnormalities.  Verbalizes understanding and chooses to continue the medication. Mag level today.  Return in 6 months.      Relevant Orders   Magnesium     Nervous and Auditory   Drug-induced tremor    Chronic, stable.  Will continue Propranolol, refills sent in, and continue collaboration with neurology as needed.  Recent note reviewed.        Genitourinary   BPH with obstruction/lower urinary tract symptoms (Chronic)    Chronic, stable with no symptoms at this time.  Last PSA 3.9 in December 2023 -- normal for age and ethnicity.  Continue current medication regimen and adjust as needed.  If elevation in PSA above goal for age 18.2 or greater, then consider urology referral.      CKD (chronic kidney disease) stage 3, GFR 30-59 ml/min (HCC)    Chronic, ongoing.  Remaining stable on recent labs.  Recommend Losartan or alternate ARB addition for kidney protection.  He refuses today.  Recheck urine ALB and CMP.        Relevant Orders   Microalbumin, Urine Waived   CBC with Differential/Platelet   Comprehensive metabolic panel     Other   Mixed hyperlipidemia (Chronic)    Chronic, ongoing.  Continue current medication regimen and adjust as needed.  CMP and lipid panel today.  Return in 6 months.      Relevant Medications   rosuvastatin (CRESTOR) 20 MG tablet   Other Relevant Orders   Comprehensive metabolic panel   Lipid Panel w/o Chol/HDL Ratio   Bipolar disorder, current episode manic without psychotic features, moderate (HCC) - Primary    Chronic, stable.  Denies SI/HI.  Continue collaboration with psychiatry and regimen as prescribed by them.   Recent note  reviewed, obtain EKG and labs for them.      Relevant Orders   EKG 12-Lead (Completed)   Elevated hemoglobin A1c measurement    History of elevation in A1c -- will recheck today and monitor.      Relevant Orders   HgB A1c   History of smoking 10-25 pack years    Quit in March 2020 after PNA.  Recommend continued cessation.  Discussed at length CT lung CA screening with him and provided pamphlet on this for review, recommend he obtain this screening.  He is not interested.  Suspect some underlying COPD present.      Other Visit Diagnoses     High risk medication use       EKG in office for psychiatry.   Relevant Orders   EKG 12-Lead (Completed)        Follow up plan: Return in about 6 months (around 10/16/2022) for HTN/HLD, CKD, MOOD.

## 2022-04-17 NOTE — Assessment & Plan Note (Signed)
Chronic, stable with Protonix.  Continue current regimen and consider trial reduction in future, if tolerated would consider discontinuation.  Risks of PPI use were discussed with patient including bone loss, C. Diff diarrhea, pneumonia, infections, CKD, electrolyte abnormalities.  Verbalizes understanding and chooses to continue the medication. Mag level today.  Return in 6 months.

## 2022-04-17 NOTE — Assessment & Plan Note (Signed)
Chronic, stable.  Denies SI/HI.  Continue collaboration with psychiatry and regimen as prescribed by them.   Recent note reviewed, obtain EKG and labs for them.

## 2022-04-17 NOTE — Assessment & Plan Note (Signed)
Chronic, stable with no symptoms at this time.  Last PSA 3.9 in December 2023 -- normal for age and ethnicity.  Continue current medication regimen and adjust as needed.  If elevation in PSA above goal for age 76.2 or greater, then consider urology referral.

## 2022-04-17 NOTE — Assessment & Plan Note (Signed)
Chronic, ongoing.  Initial BP elevated, but repeat improving -- has some know white coat due to PTSD from past involuntary commitment and provider settings.  Home BP's average at goal for age, he checks regularly.  Have recommended adding on a low dose of Losartan, would avoid ACE as suspect some underlying COPD.  He refuses starting new medication today.  Will continue Propranolol for BP and tremor.  Recommend he monitor BP at home daily and document for provider visits + focus on DASH diet.  LABS: CMP, CBC, urine ALB today.  Refills sent in.  Return in 6 months.

## 2022-04-17 NOTE — Assessment & Plan Note (Signed)
Quit in March 2020 after PNA.  Recommend continued cessation.  Discussed at length CT lung CA screening with him and provided pamphlet on this for review, recommend he obtain this screening.  He is not interested.  Suspect some underlying COPD present.

## 2022-04-18 LAB — HEMOGLOBIN A1C
Est. average glucose Bld gHb Est-mCnc: 120 mg/dL
Hgb A1c MFr Bld: 5.8 % — ABNORMAL HIGH (ref 4.8–5.6)

## 2022-04-18 LAB — CBC WITH DIFFERENTIAL/PLATELET
Basophils Absolute: 0 10*3/uL (ref 0.0–0.2)
Basos: 1 %
EOS (ABSOLUTE): 0.1 10*3/uL (ref 0.0–0.4)
Eos: 2 %
Hematocrit: 42.7 % (ref 37.5–51.0)
Hemoglobin: 14.4 g/dL (ref 13.0–17.7)
Immature Grans (Abs): 0 10*3/uL (ref 0.0–0.1)
Immature Granulocytes: 0 %
Lymphocytes Absolute: 1.5 10*3/uL (ref 0.7–3.1)
Lymphs: 25 %
MCH: 31.6 pg (ref 26.6–33.0)
MCHC: 33.7 g/dL (ref 31.5–35.7)
MCV: 94 fL (ref 79–97)
Monocytes Absolute: 0.4 10*3/uL (ref 0.1–0.9)
Monocytes: 6 %
Neutrophils Absolute: 3.8 10*3/uL (ref 1.4–7.0)
Neutrophils: 66 %
Platelets: 203 10*3/uL (ref 150–450)
RBC: 4.55 x10E6/uL (ref 4.14–5.80)
RDW: 12.9 % (ref 11.6–15.4)
WBC: 5.8 10*3/uL (ref 3.4–10.8)

## 2022-04-18 LAB — COMPREHENSIVE METABOLIC PANEL
ALT: 25 IU/L (ref 0–44)
AST: 31 IU/L (ref 0–40)
Albumin/Globulin Ratio: 2.1 (ref 1.2–2.2)
Albumin: 3.9 g/dL (ref 3.8–4.8)
Alkaline Phosphatase: 104 IU/L (ref 44–121)
BUN/Creatinine Ratio: 18 (ref 10–24)
BUN: 24 mg/dL (ref 8–27)
Bilirubin Total: 0.2 mg/dL (ref 0.0–1.2)
CO2: 22 mmol/L (ref 20–29)
Calcium: 9.3 mg/dL (ref 8.6–10.2)
Chloride: 105 mmol/L (ref 96–106)
Creatinine, Ser: 1.37 mg/dL — ABNORMAL HIGH (ref 0.76–1.27)
Globulin, Total: 1.9 g/dL (ref 1.5–4.5)
Glucose: 105 mg/dL — ABNORMAL HIGH (ref 70–99)
Potassium: 4.4 mmol/L (ref 3.5–5.2)
Sodium: 140 mmol/L (ref 134–144)
Total Protein: 5.8 g/dL — ABNORMAL LOW (ref 6.0–8.5)
eGFR: 54 mL/min/{1.73_m2} — ABNORMAL LOW (ref >59)

## 2022-04-18 LAB — TSH: TSH: 0.968 u[IU]/mL (ref 0.450–4.500)

## 2022-04-18 LAB — LIPID PANEL W/O CHOL/HDL RATIO
Cholesterol, Total: 134 mg/dL (ref 100–199)
HDL: 43 mg/dL (ref >39)
LDL Chol Calc (NIH): 75 mg/dL (ref 0–99)
Triglycerides: 79 mg/dL (ref 0–149)
VLDL Cholesterol Cal: 16 mg/dL (ref 5–40)

## 2022-04-18 LAB — MAGNESIUM: Magnesium: 1.7 mg/dL (ref 1.6–2.3)

## 2022-04-18 NOTE — Progress Notes (Signed)
Contacted via MyChart   Good evening Mavric, your labs have returned: - Kidney function continues to show baseline stage 3a kidney disease without any worsening, we will continue to monitor.  Liver function is normal. - A1c remains 5.8% and in prediabetic range, continue diet focus. - Remainder of labs stable.  Continue all current medications:) Keep being amazing!!  Thank you for allowing me to participate in your care.  I appreciate you. Kindest regards, Kamari Buch

## 2022-06-23 NOTE — Progress Notes (Unsigned)
BH MD/PA/NP OP Progress Note  06/25/2022 4:36 PM Tony Fowler  MRN:  [id number]  Chief Complaint:  Chief Complaint  Patient presents with   Follow-up   HPI:  This is a follow-up appointment for bipolar 1 disorder.  He states that things are going well.  His family has some health problems, although nothing serious.  He talks about an example of his great granddaughter has lice.  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  He denies feeling depressed, stating that he wants to make the best use of time, referring to his age.  He sleeps 5.5 hours.  He denies change in appetite.  He denies SI.  He denies anxiety.  He denies hallucinations.  He denies decreased need for sleep or euphonia.  He feels comfortable to stay on the current medication.   BP 138/82 (04/2022)  Wt Readings from Last 3 Encounters:  06/25/22 169 lb 3.2 oz (76.7 kg)  04/17/22 168 lb 6.4 oz (76.4 kg)  03/21/22 171 lb 3.2 oz (77.7 kg)     Household: daughter (57), her fiance Marital status: divorced in 1991 (he states that his wife left due to his bipolar disorder.  She was recently found to have uterine cancer.) Number of children: 2 daughters, grandchildren (age 59-33 in 2023) Employment: SSD due to bipolar disorder Education: graduate school for one year, [place] and [place]. 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 He moved to [place] in 2010 to live close to his two daughters.   Visit Diagnosis:    ICD-10-CM   1. Bipolar I disorder ([place])  F31.9       Past Psychiatric History: Please see initial evaluation for full details. I have reviewed the history. No updates at this time.     Past Medical History:  Past Medical History:  Diagnosis Date   Bipolar 1 disorder ([place])    Coarse tremors    Hypertension     Past Surgical History:  Procedure Laterality Date   TONSILECTOMY/ADENOIDECTOMY WITH  MYRINGOTOMY      Family Psychiatric History: Please see initial evaluation for full details. I have reviewed the history. No updates at this time.     Family History:  Family History  Problem Relation Age of Onset   Schizophrenia Mother    Bipolar disorder Mother    Heart disease Mother    Hypertension Mother    Mental illness Mother    Heart disease Brother    Hypertension Brother     Social History:  Social History   Socioeconomic History   Marital status: Divorced    Spouse name: Not on file   Number of children: 2   Years of education: Not on file   Highest education level: Bachelor's degree (e.g., BA, AB, BS)  Occupational History   Not on file  Tobacco Use   Smoking status: Former    Packs/day: 0.50    Years: 0.00    Additional pack years: 0.00    Total pack years: 0.00    Types: Cigarettes    Quit date: 06/13/2018    Years since quitting: 4.0   Smokeless tobacco: Never  Vaping Use   Vaping Use: Never used  Substance and Sexual Activity   Alcohol use: No    Alcohol/week: 0.0 standard drinks of alcohol    Comment: 1  beer occasionally    Drug use: No   Sexual activity: Not Currently  Other Topics Concern   Not on file  Social History Narrative   Not on file   Social Determinants of Health   Financial Resource Strain: Low Risk  (01/07/2022)   Overall Financial Resource Strain (CARDIA)    Difficulty of Paying Living Expenses: Not hard at all  Food Insecurity: No Food Insecurity (01/07/2022)   Hunger Vital Sign    Worried About Running Out of Food in the Last Year: Never true    Ran Out of Food in the Last Year: Never true  Transportation Needs: No Transportation Needs (01/07/2022)   PRAPARE - Hydrologist (Medical): No    Lack of Transportation (Non-Medical): No  Physical Activity: Insufficiently Active (01/07/2022)   Exercise Vital Sign    Days of Exercise per Week: 2 days    Minutes of Exercise per Session: 20 min  Stress:  No Stress Concern Present (01/07/2022)   Arab    Feeling of Stress : Not at all  Social Connections: Moderately Isolated (01/07/2022)   Social Connection and Isolation Panel [NHANES]    Frequency of Communication with Friends and Family: More than three times a week    Frequency of Social Gatherings with Friends and Family: More than three times a week    Attends Religious Services: 1 to 4 times per year    Active Member of [employer] or Organizations: No    Attends Archivist Meetings: Never    Marital Status: Widowed    Allergies:  Allergies  Allergen Reactions   Penicillins Rash    Childhood allergy, more than 30 years ago. No hospital required.    Metabolic Disorder Labs: Lab Results  Component Value Date   HGBA1C 5.8 (H) 04/17/2022   Lab Results  Component Value Date   PROLACTIN 10.4 09/26/2021   Lab Results  Component Value Date   CHOL 134 04/17/2022   TRIG 79 04/17/2022   HDL 43 04/17/2022   CHOLHDL 3.5 03/14/2021   VLDL 18 01/02/2017   LDLCALC 75 04/17/2022   LDLCALC 102 (H) 09/26/2021   Lab Results  Component Value Date   TSH 0.968 04/17/2022   TSH 1.020 03/14/2021    Therapeutic Level Labs: Lab Results  Component Value Date   LITHIUM 0.61 12/08/2015   LITHIUM 0.54 (L) 12/05/2015   Lab Results  Component Value Date   VALPROATE 43 (L) 06/25/2017   VALPROATE 94 04/20/2016   No results found for: "CBMZ"  Current Medications: Current Outpatient Medications  Medication Sig Dispense Refill   ARIPiprazole (ABILIFY) 5 MG tablet Take 1 tablet (5 mg total) by mouth daily. 30 tablet 3   aspirin EC 81 MG tablet Take 81 mg by mouth daily.     carbamazepine (TEGRETOL) 200 MG tablet Take 1 tablet (200 mg total) by mouth 3 (three) times daily. 90 tablet 5   gabapentin (NEURONTIN) 300 MG capsule Take by mouth.     Multiple Vitamins-Minerals (ONE DAILY CALCIUM/IRON) TABS Take by mouth.      omeprazole (PRILOSEC) 40 MG capsule Take 1 capsule (40 mg total) by mouth daily. 90 capsule 4   propranolol (INDERAL) 10 MG tablet Take 10 mg by mouth as needed (Take 1 tablet (10 mg total) by mouth Once PRN for up to 1 dose.).     propranolol (INDERAL) 10 MG tablet Take 1 tablet (10  mg total) by mouth 3 (three) times daily. 90 tablet 13   rosuvastatin (CRESTOR) 20 MG tablet Take 1 tablet (20 mg total) by mouth daily. 90 tablet 4   tamsulosin (FLOMAX) 0.4 MG CAPS capsule Take 1 capsule (0.4 mg total) by mouth daily. 90 capsule 4   gabapentin (NEURONTIN) 300 MG capsule Take 1 capsule by mouth in the morning, at noon, in the evening, and at bedtime.     No current facility-administered medications for this visit.     Musculoskeletal: Strength & Muscle Tone: within normal limits Gait & Station: normal - uses a cane Patient leans: N/A  Psychiatric Specialty Exam: Review of Systems  Psychiatric/Behavioral: Negative.    All other systems reviewed and are negative.   Blood pressure (!) 181/96, pulse 76, temperature 97.9 F (36.6 C), temperature source Skin, height 5' 6.93" (1.7 m), weight 169 lb 3.2 oz (76.7 kg).Body mass index is 26.56 kg/m.  General Appearance: Fairly Groomed  Eye Contact:  Good  Speech:  Clear and Coherent  Volume:  Normal  Mood:   good  Affect:  Appropriate, Congruent, and calm  Thought Process:  Coherent  Orientation:  Full (Time, Place, and Person)  Thought Content: Logical   Suicidal Thoughts:  No  Homicidal Thoughts:  No  Memory:  Immediate;   Good  Judgement:  Good  Insight:  Good  Psychomotor Activity:  Normal, Normal tone, no rigidity, no resting/postural tremors, no tardive dyskinesia    Concentration:  Concentration: Good and Attention Span: Good  Recall:  Good  Fund of Knowledge: Good  Language: Good  Akathisia:  No  Handed:  Right  AIMS (if indicated): 0  Assets:  Communication Skills Desire for Improvement  ADL's:  Intact  Cognition: WNL   Sleep:  Good   Screenings: AIMS    Flowsheet Row Admission (Discharged) from 12/01/2015 in [facility] Total Score 0      AUDIT    [place] Admission (Discharged) from 12/01/2015 in [facility]  Alcohol Use Disorder Identification Test Final Score (AUDIT) 0      GAD-7    Flowsheet Row Office Visit from 04/17/2022 in [facility] Office Visit from 03/21/2022 in [facility] Office Visit from 01/22/2022 in [facility] Office Visit from 11/22/2021 in [facility] Office Visit from 09/26/2021 in [facility]  Total GAD-7 Score 0 0 0 0 0      PHQ2-9    [place] Office Visit from 04/17/2022 in [facility] Office Visit from 03/21/2022 in [facility] Office Visit from 01/22/2022 in [facility] from 01/07/2022 in [facility] Office Visit from 11/22/2021 in [facility]  PHQ-2 Total Score 0 0 0 0 0  PHQ-9 Total Score 0 0 0 0 0      Flowsheet Row Office Visit from 03/21/2022 in [facility] Office Visit from 01/22/2022 in [facility] No Risk Error: Q3, 4, or 5 should not be populated when Q2 is No        Assessment and Plan:  Tony Fowler is a 76 y.o. year old male with a history of bipolar I disorder, hypertension, GERD, CKD stage III, dug induced tremor, who presents for follow up  appointment for below.  1. Bipolar I disorder ([place]) Acute stressors include:  Other stressors include: unemployment, divorce   History: dx with bipolar I disorder before age 55. no mood symptoms since the last admission in  2017, previous admission x5 for mania, was under the care of [facility] He denies any significant mood nor psychotic symptoms since the last visit. He is looking forward to live up to 42's, referring to his father, and reports good relationship with his family.  Will continue current dose of carbamazepine and Abilify to target bipolar disorder.    # Hypertension Although his blood pressure is consistently elevated at our office, his home BP is reportedly in normal range. He is under the care of his PCP.   Plan (he asks monthly refill only) Continue carbamazepine 200 mg three times a day  Continue Abilify 5 mg daily (EKG QTc 374 msec, HR 68, 04/1022) - monthly refill Next appointment: 5/17 at 4 PM for 30 mins, IP - CBC, LFT wnl in 04/2022 -on  propranolol 10 mg three times a day, gabapentin 300-300-600 mg for tremors   The patient demonstrates the following risk factors for suicide: Chronic risk factors for suicide include: psychiatric disorder of bipolar disorder . Acute risk factors for suicide include: unemployment. Protective factors for this patient include: positive social support, responsibility to others (children, family), coping skills, and hope for the future. Considering these factors, the overall suicide risk at this point appears to be low. Patient is appropriate for outpatient follow up.    Collaboration of Care: Collaboration of Care: Other reviewed notes in Epic  Patient/Guardian was advised Release of Information must be obtained prior to any record release in order to collaborate their care with an outside provider. Patient/Guardian was advised if they have not already done so to contact the registration department to sign all necessary forms in order for [place] to release information regarding their care.   Consent: Patient/Guardian gives verbal consent for treatment and assignment of benefits for services provided during this visit. Patient/Guardian expressed understanding and  agreed to proceed.    Norman Clay, MD 06/25/2022, 4:36 PM

## 2022-06-24 ENCOUNTER — Ambulatory Visit: Payer: Medicare Other | Admitting: Psychiatry

## 2022-06-25 ENCOUNTER — Encounter: Payer: Self-pay | Admitting: Psychiatry

## 2022-06-25 ENCOUNTER — Ambulatory Visit (INDEPENDENT_AMBULATORY_CARE_PROVIDER_SITE_OTHER): Payer: Medicare Other | Admitting: Psychiatry

## 2022-06-25 VITALS — BP 181/96 | HR 76 | Temp 97.9°F | Ht 66.93 in | Wt 169.2 lb

## 2022-06-25 DIAGNOSIS — F319 Bipolar disorder, unspecified: Secondary | ICD-10-CM

## 2022-06-25 MED ORDER — ARIPIPRAZOLE 5 MG PO TABS: 5.0000 mg | ORAL_TABLET | Freq: Every day | ORAL | 5 refills | Status: DC

## 2022-07-01 ENCOUNTER — Other Ambulatory Visit: Payer: Self-pay | Admitting: Psychiatry

## 2022-07-01 ENCOUNTER — Telehealth: Payer: Self-pay

## 2022-07-01 MED ORDER — ARIPIPRAZOLE 5 MG PO TABS: 5.0000 mg | ORAL_TABLET | Freq: Every day | ORAL | 5 refills | Status: DC

## 2022-07-01 NOTE — Telephone Encounter (Signed)
Discussed with the pharmacy. They cannot see the order activated until the starting date. The new orders were sent in.

## 2022-07-01 NOTE — Telephone Encounter (Signed)
Dawn at [facility] called states that you need to resend the rx with a date on it they can not fill like it is written.   "Notes to Pharmacy: This medication is scheduled to be started on 4/14. Please fill it a week prior to avoid accumulating excess medication "

## 2022-09-17 NOTE — Progress Notes (Signed)
BH MD/PA/NP OP Progress Note  09/23/2022 4:37 PM Tony Fowler  MRN:  [id number]  Chief Complaint:  Chief Complaint  Patient presents with   Follow-up   HPI:  This is a follow-up appointment for bipolar I disorder.  He states that he has been doing good.  He is 77 year old, and has a purpose to help his family.  They have some argument between each other, and he has been trying to be there for him.  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  He also has good quality of time with them.  He enjoys yard work and the housework.  He sleeps 5.5 hours.  He denies feeling depressed or anxiety.  He denies decreased need for sleep or euphoria.  He denies hallucinations.  He feels comfortable to stay on the current medication regimen.   Household: daughter (27), her fiance Marital status: divorced in 65 (he states that his wife left due to his bipolar disorder.  She was recently found to have uterine cancer.) Number of children: 2 daughters, grandchildren (age 52-33 in 2023) Employment: SSD due to bipolar disorder Education: graduate school for one year, [place] and [place]. 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 He moved to [place] in 2010 to live close to his two daughters.   Wt Readings from Last 3 Encounters:  09/23/22 171 lb 6.4 oz (77.7 kg)  06/25/22 169 lb 3.2 oz (76.7 kg)  04/17/22 168 lb 6.4 oz (76.4 kg)     Visit Diagnosis:    ICD-10-CM   1. Bipolar I disorder ([facility])  F31.9       Past Psychiatric History: Please see initial evaluation for full details. I have reviewed the history. No updates at this time.     Past Medical History:  Past Medical History:  Diagnosis Date   Bipolar 1 disorder (H[facility])    Coarse tremors    Hypertension     Past Surgical History:  Procedure Laterality Date   TONSILECTOMY/ADENOIDECTOMY WITH MYRINGOTOMY      Family Psychiatric History: Please  see initial evaluation for full details. I have reviewed the history. No updates at this time.     Family History:  Family History  Problem Relation Age of Onset   Schizophrenia Mother    Bipolar disorder Mother    Heart disease Mother    Hypertension Mother    Mental illness Mother    Heart disease Brother    Hypertension Brother     Social History:  Social History   Socioeconomic History   Marital status: Divorced    Spouse name: Not on file   Number of children: 2   Years of education: Not on file   Highest education level: Bachelor's degree (e.g., BA, AB, BS)  Occupational History   Not on file  Tobacco Use   Smoking status: Former    Packs/day: 0.50    Years: 0.00    Additional pack years: 0.00    Total pack years: 0.00    Types: Cigarettes    Quit date: 06/13/2018    Years since quitting: 4.2   Smokeless tobacco: Never  Vaping Use   Vaping Use: Never used  Substance and Sexual Activity   Alcohol use: No    Alcohol/week: 0.0 standard drinks of alcohol    Comment: 1 beer occasionally    Drug use: No  Sexual activity: Not Currently  Other Topics Concern   Not on file  Social History Narrative   Not on file   Social Determinants of Health   Financial Resource Strain: Low Risk  (01/07/2022)   Overall Financial Resource Strain (CARDIA)    Difficulty of Paying Living Expenses: Not hard at all  Food Insecurity: No Food Insecurity (01/07/2022)   Hunger Vital Sign    Worried About Running Out of Food in the Last Year: Never true    Ran Out of Food in the Last Year: Never true  Transportation Needs: No Transportation Needs (01/07/2022)   PRAPARE - Administrator, Civil Service (Medical): No    Lack of Transportation (Non-Medical): No  Physical Activity: Insufficiently Active (01/07/2022)   Exercise Vital Sign    Days of Exercise per Week: 2 days    Minutes of Exercise per Session: 20 min  Stress: No Stress Concern Present (01/07/2022)   [employer] of Occupational Health - Occupational Stress Questionnaire    Feeling of Stress : Not at all  Social Connections: Moderately Isolated (01/07/2022)   Social Connection and Isolation Panel [NHANES]    Frequency of Communication with Friends and Family: More than three times a week    Frequency of Social Gatherings with Friends and Family: More than three times a week    Attends Religious Services: 1 to 4 times per year    Active Member of [employer] or Organizations: No    Attends Banker Meetings: Never    Marital Status: Widowed    Allergies:  Allergies  Allergen Reactions   Penicillins Rash    Childhood allergy, more than 30 years ago. No hospital required.    Metabolic Disorder Labs: Lab Results  Component Value Date   HGBA1C 5.8 (H) 04/17/2022   Lab Results  Component Value Date   PROLACTIN 10.4 09/26/2021   Lab Results  Component Value Date   CHOL 134 04/17/2022   TRIG 79 04/17/2022   HDL 43 04/17/2022   CHOLHDL 3.5 03/14/2021   VLDL 18 01/02/2017   LDLCALC 75 04/17/2022   LDLCALC 102 (H) 09/26/2021   Lab Results  Component Value Date   TSH 0.968 04/17/2022   TSH 1.020 03/14/2021    Therapeutic Level Labs: Lab Results  Component Value Date   LITHIUM 0.61 12/08/2015   LITHIUM 0.54 (L) 12/05/2015   Lab Results  Component Value Date   VALPROATE 43 (L) 06/25/2017   VALPROATE 94 04/20/2016   No results found for: "CBMZ"  Current Medications: Current Outpatient Medications  Medication Sig Dispense Refill   ARIPiprazole (ABILIFY) 5 MG tablet Take 1 tablet (5 mg total) by mouth daily. 30 tablet 5   aspirin EC 81 MG tablet Take 81 mg by mouth daily.     gabapentin (NEURONTIN) 300 MG capsule Take by mouth.     Multiple Vitamins-Minerals (ONE DAILY CALCIUM/IRON) TABS Take by mouth.     omeprazole (PRILOSEC) 40 MG capsule Take 1 capsule (40 mg total) by mouth daily. 90 capsule 4   propranolol (INDERAL) 10 MG tablet Take 10 mg by mouth as  needed (Take 1 tablet (10 mg total) by mouth Once PRN for up to 1 dose.).     propranolol (INDERAL) 10 MG tablet Take 1 tablet (10 mg total) by mouth 3 (three) times daily. 90 tablet 13   rosuvastatin (CRESTOR) 20 MG tablet Take 1 tablet (20 mg total) by mouth daily. 90 tablet 4  tamsulosin (FLOMAX) 0.4 MG CAPS capsule Take 1 capsule (0.4 mg total) by mouth daily. 90 capsule 4   carbamazepine (TEGRETOL) 200 MG tablet Take 1 tablet (200 mg total) by mouth 3 (three) times daily. 90 tablet 3   gabapentin (NEURONTIN) 300 MG capsule Take 1 capsule by mouth in the morning, at noon, in the evening, and at bedtime.     No current facility-administered medications for this visit.     Musculoskeletal: Strength & Muscle Tone:  normal Gait & Station:  normal Patient leans: N/A  Psychiatric Specialty Exam: Review of Systems  Blood pressure (!) 198/100, pulse 86, temperature 98.9 F (37.2 C), temperature source Skin, height 5' 6.93" (1.7 m), weight 171 lb 6.4 oz (77.7 kg).Body mass index is 26.9 kg/m.  General Appearance: Fairly Groomed  Eye Contact:  Good  Speech:  Clear and Coherent  Volume:  Normal  Mood:   good  Affect:  Appropriate, Congruent, and calm  Thought Process:  Coherent  Orientation:  Full (Time, Place, and Person)  Thought Content: Logical   Suicidal Thoughts:  No  Homicidal Thoughts:  No  Memory:  Immediate;   Good  Judgement:  Good  Insight:  Good  Psychomotor Activity:  Normal  Concentration:  Concentration: Good and Attention Span: Good  Recall:  Good  Fund of Knowledge: Good  Language: Good  Akathisia:  No  Handed:  Right  AIMS (if indicated): not done  Assets:  Communication Skills Desire for Improvement  ADL's:  Intact  Cognition: WNL  Sleep:  Good   Screenings: AIMS    Flowsheet Row Admission (Discharged) from 12/01/2015 in [facility]  AIMS Total Score 0      AUDIT    Flowsheet Row Admission (Discharged) from 12/01/2015 in  [facility] INPATIENT BEHAVIORAL MEDICINE  Alcohol Use Disorder Identification Test Final Score (AUDIT) 0      GAD-7    Flowsheet Row Office Visit from 04/17/2022 in [facility] Office Visit from 03/21/2022 in [facility] Office Visit from 01/22/2022 in [facility] Office Visit from 11/22/2021 in [facility] Office Visit from 09/26/2021 in [facility]  Total GAD-7 Score 0 0 0 0 0      PHQ2-9    Flowsheet Row Office Visit from 04/17/2022 in [facility] Office Visit from 03/21/2022 in [facility] Office Visit from 01/22/2022 in [facility] Clinical Support from 01/07/2022 in [facility] Office Visit from 11/22/2021 in [facility]  PHQ-2 Total Score 0 0 0 0 0  PHQ-9 Total Score 0 0 0 0 0      Flowsheet Row Office Visit from 03/21/2022 in [facility] Office Visit from 01/22/2022 in [facility]  C-SSRS RISK CATEGORY No Risk Error: Q3, 4, or 5 should not be populated when Q2 is No        Assessment and Plan:  Tony Fowler is a 77 y.o. year old male with a history of bipolar I disorder, hypertension, GERD, CKD stage III, dug induced tremor, who presents for follow up appointment for below.  1. Bipolar I disorder (H[facility]) Acute stressors include:  Other stressors include: unemployment, divorce   History: dx with bipolar I disorder before age 40. no mood symptoms since the last admission in 2017, previous  admission x5 for mania, was under the care of [facility]  He denies any significant mood or psychotic symptoms since the last visit.  He is looking forward to live up to 90's, referring to his father, and  reports good relationship with his family.  Will continue current dose of carbamazepine and Abilify to target bipolar disorder.    # Hypertension Although his blood pressure is consistently elevated at our office, he is not in acute distress, and his home BP is reportedly in normal range. He is under the care of his PCP.    Plan (he asks monthly refill only) Continue carbamazepine 200 mg three times a day  Continue Abilify 5 mg daily (EKG QTc 374 msec, HR 68, 04/1022) - monthly refill Next appointment: 10/14 at 4 pm for 30 mins, IP Obtain labs (he prefers his PCP to do this)- CBC, LFT, (last checked in 04/2022-wnl) -on  propranolol 10 mg three times a day, gabapentin 300-300-600 mg for tremors   The patient demonstrates the following risk factors for suicide: Chronic risk factors for suicide include: psychiatric disorder of bipolar disorder . Acute risk factors for suicide include: unemployment. Protective factors for this patient include: positive social support, responsibility to others (children, family), coping skills, and hope for the future. Considering these factors, the overall suicide risk at this point appears to be low. Patient is appropriate for outpatient follow up.   Collaboration of Care: Collaboration of Care: Other reviewed notes in Epic  Patient/Guardian was advised Release of Information must be obtained prior to any record release in order to collaborate their care with an outside provider. Patient/Guardian was advised if they have not already done so to contact the registration department to sign all necessary forms in order for [place] to release information regarding their care.   Consent: Patient/Guardian gives verbal consent for treatment and assignment of benefits for services provided during this visit. Patient/Guardian expressed understanding and agreed to proceed.    Neysa Hotter, MD 09/23/2022, 4:37 PM

## 2022-09-23 ENCOUNTER — Ambulatory Visit (INDEPENDENT_AMBULATORY_CARE_PROVIDER_SITE_OTHER): Payer: Medicare Other | Admitting: Psychiatry

## 2022-09-23 ENCOUNTER — Encounter: Payer: Self-pay | Admitting: Psychiatry

## 2022-09-23 VITALS — BP 198/100 | HR 86 | Temp 98.9°F | Ht 66.93 in | Wt 171.4 lb

## 2022-09-23 DIAGNOSIS — F319 Bipolar disorder, unspecified: Secondary | ICD-10-CM | POA: Diagnosis not present

## 2022-09-23 MED ORDER — CARBAMAZEPINE 200 MG PO TABS: 200.0000 mg | ORAL_TABLET | Freq: Three times a day (TID) | ORAL | 3 refills | Status: DC

## 2022-09-26 DIAGNOSIS — G251 Drug-induced tremor: Secondary | ICD-10-CM | POA: Diagnosis not present

## 2022-09-26 DIAGNOSIS — I10 Essential (primary) hypertension: Secondary | ICD-10-CM | POA: Diagnosis not present

## 2022-09-26 DIAGNOSIS — F312 Bipolar disorder, current episode manic severe with psychotic features: Secondary | ICD-10-CM | POA: Diagnosis not present

## 2022-10-07 ENCOUNTER — Other Ambulatory Visit: Payer: Self-pay | Admitting: Nurse Practitioner

## 2022-10-07 DIAGNOSIS — I10 Essential (primary) hypertension: Secondary | ICD-10-CM

## 2022-10-07 DIAGNOSIS — G251 Drug-induced tremor: Secondary | ICD-10-CM

## 2022-10-08 NOTE — Telephone Encounter (Signed)
Requested Prescriptions  Pending Prescriptions Disp Refills   propranolol (INDERAL) 10 MG tablet [Pharmacy Med Name: PROPRANOLOL HCL 10 MG TAB] 90 tablet 2    Sig: TAKE 1 TABLET BY MOUTH 3 TIMES DAILY     Cardiovascular:  Beta Blockers Failed - 10/07/2022  9:38 AM      Failed - Last BP in normal range    BP Readings from Last 1 Encounters:  04/17/22 138/82         Passed - Last Heart Rate in normal range    Pulse Readings from Last 1 Encounters:  04/17/22 69         Passed - Valid encounter within last 6 months    Recent Outpatient Visits           5 months ago Bipolar disorder, current episode manic without psychotic features, moderate (HCC)   [place] [facility] Wilburn, Denton T, NP   10 months ago Bipolar disorder, current episode manic without psychotic features, moderate (HCC)   [place] [facility] Mecum, Oswaldo Conroy, PA-C   1 year ago Stage 3a chronic kidney disease (HCC)   [place] [facility] Nichols, Corrie Dandy T, NP   1 year ago Primary hypertension   [place] [facility] Vigg, Avanti, MD   1 year ago Need for influenza vaccination   [place] [facility] Vigg, Avanti, MD       Future Appointments             In 1 week Cannady, Dorie Rank, NP  [facility], [facility]

## 2022-10-13 NOTE — Patient Instructions (Signed)
Food Basics for Chronic Kidney Disease Chronic kidney disease (CKD) is when your kidneys are not working well. They cannot remove waste, fluids, and other substances from your blood the way they should. These substances can build up, which can worsen kidney damage and affect how your body works. Eating certain foods can lead to a buildup of these substances. Changing your diet can help prevent more kidney damage. Diet changes may also delay dialysis or even keep you from needing it. What nutrients should I limit? Work with your treatment team and a food expert (dietitian) to make a meal plan that's right for you. Foods you can eat and foods you should limit or avoid will depend on the stage of your kidney disease and any other health conditions you have. The items listed below are not a complete list. Talk with your dietitian to learn what is best for you. Potassium Potassium affects how steadily your heart beats. Too much potassium in your blood can cause an irregular heartbeat or even a heart attack. You may need to limit foods that are high in potassium, such as: Liquid milk and soy milk. Salt substitutes that contain potassium. Fruits like bananas, apricots, nectarines, melon, prunes, raisins, kiwi, and oranges. Vegetables, such as potatoes, sweet potatoes, yams, tomatoes, leafy greens, beets, avocado, pumpkin, and winter squash. Beans, like lima beans. Nuts. Phosphorus Phosphorus is a mineral found in your bones. You need a balance between calcium and phosphorus to build and maintain healthy bones. Too much added phosphorus from the foods you eat can pull calcium from your bones. Losing calcium can make your bones weak and more likely to break. Too much phosphorus can also make your skin itch. You may need to limit foods that are high in phosphorus or that have added phosphorus, such as: Liquid milk and dairy products. Dark-colored sodas or soft drinks. Bran cereals and  oatmeal. Protein  Protein helps you make and keep muscle. Protein also helps to repair your body's cells and tissues. One of the natural breakdown products of protein is a waste product called urea. When your kidneys are not working well, they cannot remove types of waste like urea. Reducing protein in your diet can help keep urea from building up in your blood. Depending on your stage of kidney disease, you may need to eat smaller portions of foods that are high in protein. Sources of animal protein include: Meat (all types). Fish and seafood. Poultry. Eggs. Dairy. Other protein foods include: Beans and legumes. Nuts and nut butter. Soy, like tofu.  Sodium Salt (sodium) helps to keep a healthy balance of fluids in your body. Too much salt can increase your blood pressure, which can harm your heart and lungs. Extra salt can also cause your body to keep too much fluid, making your kidneys work harder. You may need to limit or avoid foods that are high in salt, such as: Salt seasonings. Soy and teriyaki sauce. Packaged, precooked, cured, or processed meats, such as sausages or meat loaves. Sardines. Salted crackers and snack foods. Fast food. Canned soups and most canned foods. Pickled foods. Vegetable juice. Boxed mixes or ready-to-eat boxed meals and side dishes. Bottled dressings, sauces, and marinades. Talk with your dietitian about how much potassium, phosphorus, protein, and salt you may have each day. Helpful tips Read food labels  Check the amount of salt in foods. Limit foods that have salt or sodium listed among the first five ingredients. Try to eat low-salt foods. Check the ingredient list   for added phosphorus or potassium. "Phos" in an ingredient is a sign that phosphorus has been added. Do not buy foods that are calcium-enriched or that have calcium added to them (are fortified). Buy canned vegetables and beans that say "no salt added" and rinse them before  eating. Lifestyle Limit the amount of protein you eat from animal sources each day. Focus on protein from plant sources, like tofu and dried beans, peas, and lentils. Do not add salt to food when cooking or before eating. Do not eat star fruit. It can be toxic for people with kidney problems. Talk with your health care provider before taking any vitamin or mineral supplements. If told by your health care provider, track how much liquid you drink so you can avoid drinking too much. You may need to include foods you eat that are made mostly from water, like gelatin, ice cream, soups, and juicy fruits and vegetables. If you have diabetes: If you have diabetes (diabetes mellitus) and CKD, you need to keep your blood sugar (glucose) in the target range recommended by your health care provider. Follow your diabetes management plan. This may include: Checking your blood glucose regularly. Taking medicines by mouth, or taking insulin, or both. Exercising for at least 30 minutes on 5 or more days each week, or as told by your health care provider. Tracking how many servings of carbohydrates you eat at each meal. Not using orange juice to treat low blood sugars. Instead, use apple juice, cranberry juice, or clear soda. You may be given guidelines on what foods and nutrients you may eat, and how much you can have each day. This depends on your stage of kidney disease and whether you have high blood pressure (hypertension). Follow the meal plan your dietitian gives you. To learn more: National Institute of Diabetes and Digestive and Kidney Diseases: niddk.nih.gov National Kidney Foundation: kidney.org Summary Chronic kidney disease (CKD) is when your kidneys are not working well. They cannot remove waste, fluids, and other substances from your blood the way they should. These substances can build up, which can worsen kidney damage and affect how your body works. Changing your diet can help prevent more  kidney damage. Diet changes may also delay dialysis or even keep you from needing it. Diet changes are different for each person with CKD. Work with a dietitian to set up a meal plan that is right for you. This information is not intended to replace advice given to you by your health care provider. Make sure you discuss any questions you have with your health care provider. Document Revised: 07/13/2021 Document Reviewed: 07/19/2019 Elsevier Patient Education  2024 Elsevier Inc.  

## 2022-10-16 ENCOUNTER — Encounter: Payer: Self-pay | Admitting: Nurse Practitioner

## 2022-10-16 ENCOUNTER — Ambulatory Visit (INDEPENDENT_AMBULATORY_CARE_PROVIDER_SITE_OTHER): Payer: Medicare Other | Admitting: Nurse Practitioner

## 2022-10-16 VITALS — BP 118/78 | HR 71 | Temp 98.4°F | Ht 66.93 in | Wt 170.6 lb

## 2022-10-16 DIAGNOSIS — Z87891 Personal history of nicotine dependence: Secondary | ICD-10-CM | POA: Diagnosis not present

## 2022-10-16 DIAGNOSIS — F3112 Bipolar disorder, current episode manic without psychotic features, moderate: Secondary | ICD-10-CM

## 2022-10-16 DIAGNOSIS — I10 Essential (primary) hypertension: Secondary | ICD-10-CM

## 2022-10-16 DIAGNOSIS — G251 Drug-induced tremor: Secondary | ICD-10-CM | POA: Diagnosis not present

## 2022-10-16 DIAGNOSIS — N1831 Chronic kidney disease, stage 3a: Secondary | ICD-10-CM

## 2022-10-16 DIAGNOSIS — R7309 Other abnormal glucose: Secondary | ICD-10-CM | POA: Diagnosis not present

## 2022-10-16 DIAGNOSIS — E782 Mixed hyperlipidemia: Secondary | ICD-10-CM

## 2022-10-16 LAB — BAYER DCA HB A1C WAIVED: HB A1C (BAYER DCA - WAIVED): 5.9 % — ABNORMAL HIGH (ref 4.8–5.6)

## 2022-10-16 NOTE — Assessment & Plan Note (Signed)
Quit in March 2020 after PNA.  Recommend continued cessation.  Discussed at length CT lung CA screening with him and provided pamphlet on this for review, recommend he obtain this screening.  He is not interested.  Suspect some underlying COPD present. 

## 2022-10-16 NOTE — Assessment & Plan Note (Signed)
Chronic, ongoing.  Remaining stable on recent labs.  Recommend Losartan or alternate ARB addition for kidney protection.  He refuses today.  Recheck CMP.

## 2022-10-16 NOTE — Assessment & Plan Note (Signed)
Chronic, ongoing.  Continue current medication regimen and adjust as needed.  CMP and lipid panel today.  Return in 6 months. 

## 2022-10-16 NOTE — Assessment & Plan Note (Signed)
Chronic, stable.  Denies SI/HI.  Continue collaboration with psychiatry and regimen as prescribed by them.   Recent note reviewed, obtain Tegretol level today.

## 2022-10-16 NOTE — Assessment & Plan Note (Signed)
Chronic, stable.  Will continue Propranolol, refills up to date, and continue collaboration with neurology as needed.  Recent note reviewed.

## 2022-10-16 NOTE — Assessment & Plan Note (Signed)
History of elevation in A1c -- will recheck today and monitor.  Has been stable for years in 5 range.

## 2022-10-16 NOTE — Assessment & Plan Note (Signed)
Chronic, ongoing.  Initial BP elevated, but repeat improved and well below goal -- has some known white coat due to PTSD from past involuntary commitment and provider settings.  Home BP's average at goal for age, he checks regularly.  Have recommended adding on a low dose of Losartan with his CKD, would avoid ACE as suspect some underlying COPD.  He refuses starting new medication today.  Will continue Propranolol for BP and tremor.  Recommend he monitor BP at home daily and document for provider visits + focus on DASH diet.  LABS: CMP.  Refills up to date.  Return in 6 months.

## 2022-10-16 NOTE — Progress Notes (Signed)
BP 118/78 (BP Location: Left Arm, Patient Position: Sitting, Cuff Size: Normal)   Pulse 71   Temp 98.4 F (36.9 C) (Oral)   Ht 5' 6.93" (1.7 m)   Wt 170 lb 9.6 oz (77.4 kg)   SpO2 95%   BMI 26.78 kg/m    Subjective:    Patient ID: Tony Fowler, male    DOB: May 09, 1945, 77 y.o.   MRN: [id number]  HPI: Tony Fowler is a 77 y.o. male  Chief Complaint  Patient presents with   Hypertension   Hyperlipidemia   Chronic Kidney Disease   Mood   HYPERTENSION / HYPERLIPIDEMIA Continues on Propranolol 10 MG TID (for tremor and BP) and ASA + Crestor 40 MG daily.   A1c remains in prediabetic range, last visit 5.8%.  He quit smoking on March 7th, 2020. He had smoked since 77 years old, 1 PPD per day.  Declines lung CT cancer screening. Satisfied with current treatment? yes Duration of hypertension: chronic BP monitoring frequency: daily BP range: 130/80 range at home on average -- has white coat ([place] at home) -- white coat due to past involuntary commitment and that event process BP medication side effects: no Duration of hyperlipidemia: chronic Cholesterol medication side effects: no Cholesterol supplements: none Medication compliance: good compliance Aspirin: yes Recent stressors: no Recurrent headaches: no Visual changes: no Palpitations: no Dyspnea: no Chest pain: no Lower extremity edema: no Dizzy/lightheaded: no The 10-year ASCVD risk score (Arnett DK, et al., 2019) is: 25%   Values used to calculate the score:     Age: 60 years     Sex: Male     Is Non-Hispanic African American: No     Diabetic: No     Tobacco smoker: Yes     Systolic Blood Pressure: 118 mmHg     Is BP treated: Yes     HDL Cholesterol: 43 mg/dL     Total Cholesterol: 134 mg/dL  CHRONIC KIDNEY DISEASE Recent levels remain stable CKD 3a. CKD status: stable Medications renally dose: yes Previous renal evaluation: no Pneumovax:  Up to Date Influenza Vaccine:   refuses    BIPOLAR AFFECTIVE  DISORDER: Is followed by psychiatry and last visit 09/23/22  -- Dr. Vanetta Shawl. Continues on Abilify 5 MG daily and Tegretol 200 MG TID.    Visit with neurology for drug-induced tremor, last seen 09/26/22 with plan to continue Gabapentin 300 MG QID and Propranolol 10 MG TID.   Mood status: stable Satisfied with current treatment?: yes Symptom severity: mild  Duration of current treatment : chronic Side effects: no Medication compliance: good compliance Psychotherapy/counseling: current Depressed mood: no Anxious mood:  occasional , overall stable Anhedonia: no Significant weight loss or gain: no Insomnia: no Fatigue: no Feelings of worthlessness or guilt: no Impaired concentration/indecisiveness: no Suicidal ideations: no Hopelessness: no Crying spells: no    10/16/2022    9:49 AM 04/17/2022    8:30 AM 03/21/2022    3:11 PM 01/22/2022    2:15 PM 01/07/2022    9:14 AM  Depression screen PHQ 2/9  Decreased Interest 0 0   0  Down, Depressed, Hopeless 0 0   0  PHQ - 2 Score 0 0   0  Altered sleeping 0 0   0  Tired, decreased energy 0 0   0  Change in appetite 0 0   0  Feeling bad or failure about yourself  0 0   0  Trouble concentrating 0 0   0  Moving slowly or fidgety/restless 0 0   0  Suicidal thoughts 0 0   0  PHQ-9 Score 0 0   0  Difficult doing work/chores Not difficult at all Not difficult at all   Not difficult at all     Information is confidential and restricted. Go to Review Flowsheets to unlock data.       10/16/2022    9:49 AM 04/17/2022    8:31 AM 03/21/2022    3:12 PM 01/22/2022    2:16 PM  GAD 7 : Generalized Anxiety Score  Nervous, Anxious, on Edge 1 0    Control/stop worrying 1 0    Worry too much - different things 1 0    Trouble relaxing 0 0    Restless 0 0    Easily annoyed or irritable 1 0    Afraid - awful might happen 0 0    Total GAD 7 Score 4 0    Anxiety Difficulty Not difficult at all Not difficult at all       Information is confidential and  restricted. Go to Review Flowsheets to unlock data.     Relevant past medical, surgical, family and social history reviewed and updated as indicated. Interim medical history since our last visit reviewed. Allergies and medications reviewed and updated.  Review of Systems  Constitutional:  Negative for activity change, diaphoresis, fatigue and fever.  Respiratory:  Negative for cough, chest tightness, shortness of breath and wheezing.   Cardiovascular:  Negative for chest pain, palpitations and leg swelling.  Gastrointestinal: Negative.   Endocrine: Negative.   Neurological: Negative.   Psychiatric/Behavioral: Negative.      Per HPI unless specifically indicated above     Objective:    BP 118/78 (BP Location: Left Arm, Patient Position: Sitting, Cuff Size: Normal)   Pulse 71   Temp 98.4 F (36.9 C) (Oral)   Ht 5' 6.93" (1.7 m)   Wt 170 lb 9.6 oz (77.4 kg)   SpO2 95%   BMI 26.78 kg/m   Wt Readings from Last 3 Encounters:  10/16/22 170 lb 9.6 oz (77.4 kg)  04/17/22 168 lb 6.4 oz (76.4 kg)  11/22/21 167 lb 8 oz (76 kg)    Physical Exam Vitals and nursing note reviewed.  Constitutional:      General: He is awake. He is not in acute distress.    Appearance: He is well-developed, well-groomed and overweight. He is not ill-appearing.  HENT:     Head: Normocephalic and atraumatic.     Right Ear: Hearing normal. No drainage.     Left Ear: Hearing normal. No drainage.  Eyes:     General: Lids are normal.        Right eye: No discharge.        Left eye: No discharge.     Conjunctiva/sclera: Conjunctivae normal.     Pupils: Pupils are equal, round, and reactive to light.  Neck:     Thyroid: No thyromegaly.     Vascular: No carotid bruit.     Trachea: Trachea normal.  Cardiovascular:     Rate and Rhythm: Normal rate and regular rhythm.     Heart sounds: Normal heart sounds, S1 normal and S2 normal. No murmur heard.    No gallop.  Pulmonary:     Effort: Pulmonary effort is  normal. No accessory muscle usage or respiratory distress.     Breath sounds: Normal breath sounds.  Abdominal:     General: Bowel sounds  are normal.     Palpations: Abdomen is soft.  Musculoskeletal:        General: Normal range of motion.     Cervical back: Normal range of motion and neck supple.     Right lower leg: No edema.     Left lower leg: No edema.  Skin:    General: Skin is warm and dry.     Capillary Refill: Capillary refill takes less than 2 seconds.     Findings: No rash.  Neurological:     Mental Status: He is alert and oriented to person, place, and time.     Deep Tendon Reflexes: Reflexes are normal and symmetric.  Psychiatric:        Attention and Perception: Attention normal.        Mood and Affect: Mood normal.        Speech: Speech normal.        Behavior: Behavior normal. Behavior is cooperative.        Thought Content: Thought content normal.    Results for orders placed or performed in visit on 04/17/22  Microalbumin, Urine Waived  Result Value Ref Range   Microalb, Ur Waived 80 (H) 0 - 19 mg/L   Creatinine, Urine Waived 50 10 - 300 mg/dL   Microalb/Creat Ratio 30-300 (H) <30 mg/g  CBC with Differential/Platelet  Result Value Ref Range   WBC 5.8 3.4 - 10.8 x10E3/uL   RBC 4.55 4.14 - 5.80 x10E6/uL   Hemoglobin 14.4 13.0 - 17.7 g/dL   Hematocrit 40.9 81.1 - 51.0 %   MCV 94 79 - 97 fL   MCH 31.6 26.6 - 33.0 pg   MCHC 33.7 31.5 - 35.7 g/dL   RDW 91.4 78.2 - 95.6 %   Platelets 203 150 - 450 x10E3/uL   Neutrophils 66 Not Estab. %   Lymphs 25 Not Estab. %   Monocytes 6 Not Estab. %   Eos 2 Not Estab. %   Basos 1 Not Estab. %   Neutrophils Absolute 3.8 1.4 - 7.0 x10E3/uL   Lymphocytes Absolute 1.5 0.7 - 3.1 x10E3/uL   Monocytes Absolute 0.4 0.1 - 0.9 x10E3/uL   EOS (ABSOLUTE) 0.1 0.0 - 0.4 x10E3/uL   Basophils Absolute 0.0 0.0 - 0.2 x10E3/uL   Immature Granulocytes 0 Not Estab. %   Immature Grans (Abs) 0.0 0.0 - 0.1 x10E3/uL  Comprehensive  metabolic panel  Result Value Ref Range   Glucose 105 (H) 70 - 99 mg/dL   BUN 24 8 - 27 mg/dL   Creatinine, Ser 2.13 (H) 0.76 - 1.27 mg/dL   eGFR 54 (L) >08 MV/HQI/6.96   BUN/Creatinine Ratio 18 10 - 24   Sodium 140 134 - 144 mmol/L   Potassium 4.4 3.5 - 5.2 mmol/L   Chloride 105 96 - 106 mmol/L   CO2 22 20 - 29 mmol/L   Calcium 9.3 8.6 - 10.2 mg/dL   Total Protein 5.8 (L) 6.0 - 8.5 g/dL   Albumin 3.9 3.8 - 4.8 g/dL   Globulin, Total 1.9 1.5 - 4.5 g/dL   Albumin/Globulin Ratio 2.1 1.2 - 2.2   Bilirubin Total 0.2 0.0 - 1.2 mg/dL   Alkaline Phosphatase 104 44 - 121 IU/L   AST 31 0 - 40 IU/L   ALT 25 0 - 44 IU/L  Lipid Panel w/o Chol/HDL Ratio  Result Value Ref Range   Cholesterol, Total 134 100 - 199 mg/dL   Triglycerides 79 0 - 149 mg/dL   HDL 43 >  39 mg/dL   VLDL Cholesterol Cal 16 5 - 40 mg/dL   LDL Chol Calc (NIH) 75 0 - 99 mg/dL  TSH  Result Value Ref Range   TSH 0.968 0.450 - 4.500 uIU/mL  Magnesium  Result Value Ref Range   Magnesium 1.7 1.6 - 2.3 mg/dL  HgB W0J  Result Value Ref Range   Hgb A1c MFr Bld 5.8 (H) 4.8 - 5.6 %   Est. average glucose Bld gHb Est-mCnc 120 mg/dL      Assessment & Plan:   Problem List Items Addressed This Visit       Cardiovascular and Mediastinum   Benign hypertension (Chronic)    Chronic, ongoing.  Initial BP elevated, but repeat improved and well below goal -- has some known white coat due to PTSD from past involuntary commitment and provider settings.  Home BP's average at goal for age, he checks regularly.  Have recommended adding on a low dose of Losartan with his CKD, would avoid ACE as suspect some underlying COPD.  He refuses starting new medication today.  Will continue Propranolol for BP and tremor.  Recommend he monitor BP at home daily and document for provider visits + focus on DASH diet.  LABS: CMP.  Refills up to date.  Return in 6 months.      Relevant Orders   Comprehensive metabolic panel     Nervous and Auditory    Drug-induced tremor    Chronic, stable.  Will continue Propranolol, refills up to date, and continue collaboration with neurology as needed.  Recent note reviewed.        Genitourinary   CKD (chronic kidney disease) stage 3, GFR 30-59 ml/min (HCC)    Chronic, ongoing.  Remaining stable on recent labs.  Recommend Losartan or alternate ARB addition for kidney protection.  He refuses today.  Recheck CMP.      Relevant Orders   Comprehensive metabolic panel     Other   Mixed hyperlipidemia (Chronic)    Chronic, ongoing.  Continue current medication regimen and adjust as needed.  CMP and lipid panel today.  Return in 6 months.      Relevant Orders   Comprehensive metabolic panel   Lipid Panel w/o Chol/HDL Ratio   Bipolar disorder, current episode manic without psychotic features, moderate (HCC) - Primary    Chronic, stable.  Denies SI/HI.  Continue collaboration with psychiatry and regimen as prescribed by them.   Recent note reviewed, obtain Tegretol level today.      Relevant Orders   Carbamazepine Level (Tegretol), total   Elevated hemoglobin A1c measurement    History of elevation in A1c -- will recheck today and monitor.  Has been stable for years in 5 range.      Relevant Orders   Bayer DCA Hb A1c Waived   History of smoking 10-25 pack years    Quit in March 2020 after PNA.  Recommend continued cessation.  Discussed at length CT lung CA screening with him and provided pamphlet on this for review, recommend he obtain this screening.  He is not interested.  Suspect some underlying COPD present.        Follow up plan: Return in about 6 months (around 04/18/2023) for HTN/HLD, CKD, MOOD, GERD, BPH.

## 2022-10-17 LAB — COMPREHENSIVE METABOLIC PANEL
ALT: 22 IU/L (ref 0–44)
AST: 28 IU/L (ref 0–40)
Albumin: 4.1 g/dL (ref 3.8–4.8)
Alkaline Phosphatase: 113 IU/L (ref 44–121)
BUN/Creatinine Ratio: 16 (ref 10–24)
BUN: 22 mg/dL (ref 8–27)
Bilirubin Total: 0.2 mg/dL (ref 0.0–1.2)
CO2: 21 mmol/L (ref 20–29)
Calcium: 9.5 mg/dL (ref 8.6–10.2)
Chloride: 102 mmol/L (ref 96–106)
Creatinine, Ser: 1.37 mg/dL — ABNORMAL HIGH (ref 0.76–1.27)
Globulin, Total: 2.4 g/dL (ref 1.5–4.5)
Glucose: 113 mg/dL — ABNORMAL HIGH (ref 70–99)
Potassium: 4.4 mmol/L (ref 3.5–5.2)
Sodium: 137 mmol/L (ref 134–144)
Total Protein: 6.5 g/dL (ref 6.0–8.5)
eGFR: 54 mL/min/{1.73_m2} — ABNORMAL LOW (ref >59)

## 2022-10-17 LAB — LIPID PANEL W/O CHOL/HDL RATIO
Cholesterol, Total: 179 mg/dL (ref 100–199)
HDL: 57 mg/dL (ref >39)
LDL Chol Calc (NIH): 104 mg/dL — ABNORMAL HIGH (ref 0–99)
Triglycerides: 98 mg/dL (ref 0–149)
VLDL Cholesterol Cal: 18 mg/dL (ref 5–40)

## 2022-10-17 LAB — CARBAMAZEPINE LEVEL, TOTAL: Carbamazepine (Tegretol), S: 9.4 ug/mL (ref 4.0–12.0)

## 2022-10-17 NOTE — Progress Notes (Signed)
Contacted via MyChart   Good afternoon Janai, your labs have returned: - Kidney function, creatinine and eGFR, continues to show stable Stage 3a kidney disease with no worsening.  We will continue to monitor closely.  Ensure good water intake at home and no Ibuprofen products. - Cholesterol levels LDL trended up a little from last check.  Were you fasting?  If not come fasting next visit please.  Continue Rosuvastatin. - Tegretol level is therapeutic.  I will send to Dr. Vanetta Shawl.  Any questions? Keep being awesome!!  Thank you for allowing me to participate in your care.  I appreciate you. Kindest regards, Vaniyah Lansky

## 2022-12-05 ENCOUNTER — Other Ambulatory Visit: Payer: Self-pay | Admitting: Nurse Practitioner

## 2022-12-05 DIAGNOSIS — K219 Gastro-esophageal reflux disease without esophagitis: Secondary | ICD-10-CM

## 2022-12-05 DIAGNOSIS — N138 Other obstructive and reflux uropathy: Secondary | ICD-10-CM

## 2022-12-06 NOTE — Telephone Encounter (Signed)
Requested medication (s) are due for refill today: yes  Requested medication (s) are on the active medication list: yes  Last refill:  09/26/21 #90 4 RF  Future visit scheduled: yes  Notes to clinic:  lab work out of date   Requested Prescriptions  Pending Prescriptions Disp Refills   tamsulosin (FLOMAX) 0.4 MG CAPS capsule [Pharmacy Med Name: TAMSULOSIN HCL 0.4 MG CAP]      Sig: TAKE 1 CAPSULE BY MOUTH DAILY     Urology: Alpha-Adrenergic Blocker Failed - 12/05/2022  5:32 PM      Failed - PSA in normal range and within 360 days    Prostate Specific Ag, Serum  Date Value Ref Range Status  03/14/2021 3.9 0.0 - 4.0 ng/mL Final    Comment:    Roche ECLIA methodology. According to the American Urological Association, Serum PSA should decrease and remain at undetectable levels after radical prostatectomy. The AUA defines biochemical recurrence as an initial PSA value 0.2 ng/mL or greater followed by a subsequent confirmatory PSA value 0.2 ng/mL or greater. Values obtained with different assay methods or kits cannot be used interchangeably. Results cannot be interpreted as absolute evidence of the presence or absence of malignant disease.          Passed - Last BP in normal range    BP Readings from Last 1 Encounters:  10/16/22 118/78         Passed - Valid encounter within last 12 months    Recent Outpatient Visits           1 month ago Bipolar disorder, current episode manic without psychotic features, moderate (HCC)   [place] [facility] [place], [place] T, NP   7 months ago Bipolar disorder, current episode manic without psychotic features, moderate (HCC)   [place] [facility] [place], Corrie Dandy T, NP   1 year ago Bipolar disorder, current episode manic without psychotic features, moderate (HCC)   [place] [facility] Mecum, Oswaldo Conroy, PA-C   1 year ago Stage 3a chronic kidney disease (HCC)   [place] [facility]  [place], Corrie Dandy T, NP   1 year ago Primary hypertension   [place] [facility] Vigg, Avanti, MD       Future Appointments             In 4 months Cannady, Dorie Rank, NP [place] [facility]            Signed Prescriptions Disp Refills   omeprazole (PRILOSEC) 40 MG capsule 90 capsule 3    Sig: TAKE 1 CAPSULE BY MOUTH DAILY     Gastroenterology: Proton Pump Inhibitors Passed - 12/05/2022  5:32 PM      Passed - Valid encounter within last 12 months    Recent Outpatient Visits           1 month ago Bipolar disorder, current episode manic without psychotic features, moderate (HCC)   [place] [facility] [place], [place] T, NP   7 months ago Bipolar disorder, current episode manic without psychotic features, moderate (HCC)   [place] [facility] [place], [place] T, NP   1 year ago Bipolar disorder, current episode manic without psychotic features, moderate (HCC)    [facility] Mecum, Oswaldo Conroy, PA-C   1 year ago Stage 3a chronic kidney disease (HCC)    [facility] [place], Corrie Dandy T, NP   1 year ago Primary  hypertension   [place] [facility] Vigg, Avanti, MD       Future Appointments             In 4 months Cannady, Dorie Rank, NP [facility]            Refused Prescriptions Disp Refills   rosuvastatin (CRESTOR) 40 MG tablet [Pharmacy Med Name: ROSUVASTATIN CALCIUM 40 MG TAB] 90 tablet     Sig: TAKE 1 TABLET BY MOUTH DAILY     Cardiovascular:  Antilipid - Statins 2 Failed - 12/05/2022  5:32 PM      Failed - Cr in normal range and within 360 days    Creatinine, Ser  Date Value Ref Range Status  10/16/2022 1.37 (H) 0.76 - 1.27 mg/dL Final         Failed - Lipid Panel in normal range within the last 12 months    Cholesterol, Total  Date Value Ref Range Status  10/16/2022 179 100 - 199 mg/dL Final   Cholesterol  Piccolo, Waived  Date Value Ref Range Status  01/02/2017 210 (H) <200 mg/dL Final    Comment:                            Desirable                <200                         Borderline High      200- 239                         High                     >239    LDL Chol Calc (NIH)  Date Value Ref Range Status  10/16/2022 104 (H) 0 - 99 mg/dL Final   HDL  Date Value Ref Range Status  10/16/2022 57 >39 mg/dL Final   Triglycerides  Date Value Ref Range Status  10/16/2022 98 0 - 149 mg/dL Final   Triglycerides Piccolo,Waived  Date Value Ref Range Status  01/02/2017 88 <150 mg/dL Final    Comment:                            Normal                   <150                         Borderline High     150 - 199                         High                200 - 499                         Very High                >499          Passed - Patient is not pregnant      Passed - Valid encounter within last 12 months    Recent Outpatient Visits  1 month ago Bipolar disorder, current episode manic without psychotic features, moderate (HCC)   [place] [facility] [place], [place] T, NP   7 months ago Bipolar disorder, current episode manic without psychotic features, moderate (HCC)   [place] [facility] [place], Corrie Dandy T, NP   1 year ago Bipolar disorder, current episode manic without psychotic features, moderate (HCC)   [place] [facility] Mecum, Oswaldo Conroy, PA-C   1 year ago Stage 3a chronic kidney disease (HCC)   [place] [facility] [place], Corrie Dandy T, NP   1 year ago Primary hypertension   [place] [facility] Vigg, Avanti, MD       Future Appointments             In 4 months Cannady, Dorie Rank, NP [place] [facility], [facility]

## 2022-12-06 NOTE — Telephone Encounter (Signed)
Requested Prescriptions  Pending Prescriptions Disp Refills   rosuvastatin (CRESTOR) 40 MG tablet [Pharmacy Med Name: ROSUVASTATIN CALCIUM 40 MG TAB] 90 tablet     Sig: TAKE 1 TABLET BY MOUTH DAILY     Cardiovascular:  Antilipid - Statins 2 Failed - 12/05/2022  5:32 PM      Failed - Cr in normal range and within 360 days    Creatinine, Ser  Date Value Ref Range Status  10/16/2022 1.37 (H) 0.76 - 1.27 mg/dL Final         Failed - Lipid Panel in normal range within the last 12 months    Cholesterol, Total  Date Value Ref Range Status  10/16/2022 179 100 - 199 mg/dL Final   Cholesterol Piccolo, Waived  Date Value Ref Range Status  01/02/2017 210 (H) <200 mg/dL Final    Comment:                            Desirable                <200                         Borderline High      200- 239                         High                     >239    LDL Chol Calc (NIH)  Date Value Ref Range Status  10/16/2022 104 (H) 0 - 99 mg/dL Final   HDL  Date Value Ref Range Status  10/16/2022 57 >39 mg/dL Final   Triglycerides  Date Value Ref Range Status  10/16/2022 98 0 - 149 mg/dL Final   Triglycerides Piccolo,Waived  Date Value Ref Range Status  01/02/2017 88 <150 mg/dL Final    Comment:                            Normal                   <150                         Borderline High     150 - 199                         High                200 - 499                         Very High                >499          Passed - Patient is not pregnant      Passed - Valid encounter within last 12 months    Recent Outpatient Visits           1 month ago Bipolar disorder, current episode manic without psychotic features, moderate (HCC)   [place] [facility] [place], [place] T, NP   7 months ago Bipolar disorder, current episode manic without psychotic features, moderate (HCC)   [place] [facility] [place],  Dorie Rank, NP   1 year ago Bipolar disorder,  current episode manic without psychotic features, moderate (HCC)   [place] [facility] Mecum, Oswaldo Conroy, PA-C   1 year ago Stage 3a chronic kidney disease (HCC)   [place] [facility] [place], Corrie Dandy T, NP   1 year ago Primary hypertension   [place] [facility] Vigg, Avanti, MD       Future Appointments             In 4 months Cannady, Dorie Rank, NP [place] [facility]             tamsulosin (FLOMAX) 0.4 MG CAPS capsule [Pharmacy Med Name: TAMSULOSIN HCL 0.4 MG CAP]      Sig: TAKE 1 CAPSULE BY MOUTH DAILY     Urology: Alpha-Adrenergic Blocker Failed - 12/05/2022  5:32 PM      Failed - PSA in normal range and within 360 days    Prostate Specific Ag, Serum  Date Value Ref Range Status  03/14/2021 3.9 0.0 - 4.0 ng/mL Final    Comment:    Roche ECLIA methodology. According to the American Urological Association, Serum PSA should decrease and remain at undetectable levels after radical prostatectomy. The AUA defines biochemical recurrence as an initial PSA value 0.2 ng/mL or greater followed by a subsequent confirmatory PSA value 0.2 ng/mL or greater. Values obtained with different assay methods or kits cannot be used interchangeably. Results cannot be interpreted as absolute evidence of the presence or absence of malignant disease.          Passed - Last BP in normal range    BP Readings from Last 1 Encounters:  10/16/22 118/78         Passed - Valid encounter within last 12 months    Recent Outpatient Visits           1 month ago Bipolar disorder, current episode manic without psychotic features, moderate (HCC)   [place] [facility] [place], [place] T, NP   7 months ago Bipolar disorder, current episode manic without psychotic features, moderate (HCC)   [place] [facility] [place], Corrie Dandy T, NP   1 year ago Bipolar disorder, current episode manic without psychotic  features, moderate (HCC)   [place] [facility] Mecum, Oswaldo Conroy, PA-C   1 year ago Stage 3a chronic kidney disease (HCC)   [place] [facility] [place], Corrie Dandy T, NP   1 year ago Primary hypertension   [place] [facility] Vigg, Avanti, MD       Future Appointments             In 4 months Cannady, Dorie Rank, NP [place] [facility]            Signed Prescriptions Disp Refills   omeprazole (PRILOSEC) 40 MG capsule 90 capsule 3    Sig: TAKE 1 CAPSULE BY MOUTH DAILY     Gastroenterology: Proton Pump Inhibitors Passed - 12/05/2022  5:32 PM      Passed - Valid encounter within last 12 months    Recent Outpatient Visits           1 month ago Bipolar disorder, current episode manic without psychotic features, moderate (HCC)   [place] [facility] [place], [place] T, NP   7 months ago Bipolar disorder, current episode manic without psychotic features, moderate (HCC)   [place] [facility] [place], Corrie Dandy T, NP   1 year ago Bipolar disorder, current episode manic without psychotic features, moderate (HCC)   [place] [facility] Mecum, Oswaldo Conroy, PA-C   1 year ago Stage 3a chronic kidney disease (HCC)   [place] [facility] [place], Corrie Dandy T, NP   1 year ago Primary hypertension   [place] [facility] Vigg, Avanti, MD       Future Appointments             In 4 months Cannady, Dorie Rank, NP [place] [facility], [facility]

## 2022-12-06 NOTE — Telephone Encounter (Signed)
Requested Prescriptions  Pending Prescriptions Disp Refills   omeprazole (PRILOSEC) 40 MG capsule [Pharmacy Med Name: OMEPRAZOLE DR 40 MG CAP] 90 capsule 3    Sig: TAKE 1 CAPSULE BY MOUTH DAILY     Gastroenterology: Proton Pump Inhibitors Passed - 12/05/2022  5:32 PM      Passed - Valid encounter within last 12 months    Recent Outpatient Visits           1 month ago Bipolar disorder, current episode manic without psychotic features, moderate (HCC)   [place] [facility] [place], [place] T, NP   7 months ago Bipolar disorder, current episode manic without psychotic features, moderate (HCC)   [place] [facility] [place], [place] T, NP   1 year ago Bipolar disorder, current episode manic without psychotic features, moderate (HCC)   [place] [facility] Mecum, Erin E, PA-C   1 year ago Stage 3a chronic kidney disease (HCC)   [place] [facility] [place], [place] T, NP   1 year ago Primary hypertension   [place] [facility] Vigg, Avanti, MD       Future Appointments             In 4 months Cannady, Dorie Rank, NP [place] [facility]             rosuvastatin (CRESTOR) 40 MG tablet [Pharmacy Med Name: ROSUVASTATIN CALCIUM 40 MG TAB] 90 tablet     Sig: TAKE 1 TABLET BY MOUTH DAILY     Cardiovascular:  Antilipid - Statins 2 Failed - 12/05/2022  5:32 PM      Failed - Cr in normal range and within 360 days    Creatinine, Ser  Date Value Ref Range Status  10/16/2022 1.37 (H) 0.76 - 1.27 mg/dL Final         Failed - Lipid Panel in normal range within the last 12 months    Cholesterol, Total  Date Value Ref Range Status  10/16/2022 179 100 - 199 mg/dL Final   Cholesterol Piccolo, Waived  Date Value Ref Range Status  01/02/2017 210 (H) <200 mg/dL Final    Comment:                            Desirable                <200                         Borderline High      200- 239                          High                     >239    LDL Chol Calc (NIH)  Date Value Ref Range Status  10/16/2022 104 (H) 0 - 99 mg/dL Final   HDL  Date Value Ref Range Status  10/16/2022 57 >39 mg/dL Final   Triglycerides  Date Value Ref Range Status  10/16/2022 98 0 - 149 mg/dL Final   Triglycerides Piccolo,Waived  Date Value Ref Range Status  01/02/2017 88 <150 mg/dL Final    Comment:  Normal                   <150                         Borderline High     150 - 199                         High                200 - 499                         Very High                >499          Passed - Patient is not pregnant      Passed - Valid encounter within last 12 months    Recent Outpatient Visits           1 month ago Bipolar disorder, current episode manic without psychotic features, moderate (HCC)   [place] [facility] [place], [place] T, NP   7 months ago Bipolar disorder, current episode manic without psychotic features, moderate (HCC)   [place] [facility] [place], [place] T, NP   1 year ago Bipolar disorder, current episode manic without psychotic features, moderate (HCC)   [place] [facility] Mecum, Oswaldo Conroy, PA-C   1 year ago Stage 3a chronic kidney disease (HCC)   [place] [facility] [place], Corrie Dandy T, NP   1 year ago Primary hypertension   [place] [facility] Vigg, Avanti, MD       Future Appointments             In 4 months Cannady, Dorie Rank, NP [place] [facility]             tamsulosin (FLOMAX) 0.4 MG CAPS capsule [Pharmacy Med Name: TAMSULOSIN HCL 0.4 MG CAP]      Sig: TAKE 1 CAPSULE BY MOUTH DAILY     Urology: Alpha-Adrenergic Blocker Failed - 12/05/2022  5:32 PM      Failed - PSA in normal range and within 360 days    Prostate Specific Ag, Serum  Date Value Ref Range Status  03/14/2021 3.9 0.0 - 4.0 ng/mL Final     Comment:    Roche ECLIA methodology. According to the American Urological Association, Serum PSA should decrease and remain at undetectable levels after radical prostatectomy. The AUA defines biochemical recurrence as an initial PSA value 0.2 ng/mL or greater followed by a subsequent confirmatory PSA value 0.2 ng/mL or greater. Values obtained with different assay methods or kits cannot be used interchangeably. Results cannot be interpreted as absolute evidence of the presence or absence of malignant disease.          Passed - Last BP in normal range    BP Readings from Last 1 Encounters:  10/16/22 118/78         Passed - Valid encounter within last 12 months    Recent Outpatient Visits           1 month ago Bipolar disorder, current episode manic without psychotic features, moderate (HCC)   [place] [facility] [place], [place] T, NP   7 months ago Bipolar disorder, current episode manic without psychotic features, moderate (HCC)   [facility] [place], Corrie Dandy T, NP   1 year ago Bipolar disorder, current episode manic without psychotic features, moderate (HCC)   [place] [facility] Mecum, Oswaldo Conroy, PA-C   1 year ago Stage 3a chronic kidney disease (HCC)   [place] [facility] [place], Corrie Dandy T, NP   1 year ago Primary hypertension   [place] [facility] Vigg, Avanti, MD       Future Appointments             In 4 months Cannady, Dorie Rank, NP [place] [facility], [facility]

## 2022-12-25 DIAGNOSIS — Z23 Encounter for immunization: Secondary | ICD-10-CM | POA: Diagnosis not present

## 2023-01-06 ENCOUNTER — Other Ambulatory Visit: Payer: Self-pay | Admitting: Nurse Practitioner

## 2023-01-07 NOTE — Telephone Encounter (Signed)
Requested medication (s) are due for refill today: yes  Requested medication (s) are on the active medication list: yes  Last refill:  10/08/22 #9 2 refills  Future visit scheduled: yes in 3 months  Notes to clinic:   do you want to continue refills?     Requested Prescriptions  Pending Prescriptions Disp Refills   propranolol (INDERAL) 10 MG tablet [Pharmacy Med Name: PROPRANOLOL HCL 10 MG TAB] 90 tablet     Sig: TAKE 1 TABLET BY MOUTH 3 TIMES DAILY     Cardiovascular:  Beta Blockers Passed - 01/06/2023  9:45 AM      Passed - Last BP in normal range    BP Readings from Last 1 Encounters:  10/16/22 118/78         Passed - Last Heart Rate in normal range    Pulse Readings from Last 1 Encounters:  10/16/22 71         Passed - Valid encounter within last 6 months    Recent Outpatient Visits           2 months ago Bipolar disorder, current episode manic without psychotic features, moderate (HCC)   [place] [facility] [place], [place] T, NP   8 months ago Bipolar disorder, current episode manic without psychotic features, moderate (HCC)   [place] [facility] [place], [place] T, NP   1 year ago Bipolar disorder, current episode manic without psychotic features, moderate (HCC)   [place] [facility] Mecum, Oswaldo Conroy, PA-C   1 year ago Stage 3a chronic kidney disease (HCC)   [place] [facility] [place], Corrie Dandy T, NP   1 year ago Primary hypertension   [place] [facility] Vigg, Avanti, MD       Future Appointments             In 3 months Cannady, Dorie Rank, NP [facility]

## 2023-01-15 NOTE — Progress Notes (Signed)
BH MD/PA/NP OP Progress Note  01/20/2023 4:39 PM Tony Fowler  MRN:  [id number]  Chief Complaint:  Chief Complaint  Patient presents with   Follow-up   HPI:  This is a follow-up appointment for bipolar 1 disorder.  He states that he has been doing good.  He had her birthday, and went out with his some of his family.  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  He states that he was psychologically paralyzed as others, who went to combat.  He denies any nightmares or flashbacks from this experience.  He sleeps up to 5.5 hours with occasional middle insomnia due to nocturia.  He feels rested in the morning.  He denies feeling depressed or anxiety.  He denies decreased need for sleep or euphoria.  He denies hallucinations.  He feels comfortable to stay on the current medication regimen.   Wt Readings from Last 3 Encounters:  01/20/23 172 lb 6.4 oz (78.2 kg)  10/16/22 170 lb 9.6 oz (77.4 kg)  09/23/22 171 lb 6.4 oz (77.7 kg)     Household: daughter (73), her fiance Marital status: divorced in 1991 (he states that his wife left due to his bipolar disorder.  She was recently found to have uterine cancer.) Number of children: 2 daughters, grandchildren (age 23-33 in 2023) Employment: [employer] due to bipolar disorder Education: graduate school for one year, [place] and [place]. 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 He moved to [place] in 2010 to live close to his two daughters.   Visit Diagnosis:    ICD-10-CM   1. Bipolar I disorder (HCC)  F31.9     2. High risk medication use  Z79.899       Past Psychiatric History: Please see initial evaluation for full details. I have reviewed the history. No updates at this time.     Past Medical History:   Past Medical History:  Diagnosis Date   Bipolar 1 disorder (HCC)    Coarse tremors    Hypertension     Past Surgical History:  Procedure Laterality Date   TONSILECTOMY/ADENOIDECTOMY WITH MYRINGOTOMY      Family Psychiatric History: Please see initial evaluation for full details. I have reviewed the history. No updates at this time.     Family History:  Family History  Problem Relation Age of Onset   Schizophrenia Mother    Bipolar disorder Mother    Heart disease Mother    Hypertension Mother    Mental illness Mother    Heart disease Brother    Hypertension Brother     Social History:  Social History   Socioeconomic History   Marital status: Divorced    Spouse name: Not on file   Number of children: 2   Years of education: Not on file   Highest education level: Bachelor's degree (e.g., BA, AB, BS)  Occupational History   Not on file  Tobacco Use   Smoking status: Former    Current packs/day: 0.00    Types: Cigarettes    Start date: 06/13/2018    Quit date: 06/13/2018    Years since quitting: 4.6   Smokeless tobacco: Never  Vaping Use   Vaping  status: Never Used  Substance and Sexual Activity   Alcohol use: No    Alcohol/week: 0.0 standard drinks of alcohol    Comment: 1 beer occasionally    Drug use: No   Sexual activity: Not Currently  Other Topics Concern   Not on file  Social History Narrative   Not on file   Social Determinants of Health   Financial Resource Strain: Low Risk  (01/07/2022)   Overall Financial Resource Strain (CARDIA)    Difficulty of Paying Living Expenses: Not hard at all  Food Insecurity: No Food Insecurity (01/07/2022)   Hunger Vital Sign    Worried About Running Out of Food in the Last Year: Never true    Ran Out of Food in the Last Year: Never true  Transportation Needs: No Transportation Needs (01/07/2022)   PRAPARE - Administrator, Civil Service (Medical): No    Lack of Transportation (Non-Medical): No  Physical  Activity: Insufficiently Active (01/07/2022)   Exercise Vital Sign    Days of Exercise per Week: 2 days    Minutes of Exercise per Session: 20 min  Stress: No Stress Concern Present (01/07/2022)   [employer] of Occupational Health - Occupational Stress Questionnaire    Feeling of Stress : Not at all  Social Connections: Moderately Isolated (01/07/2022)   Social Connection and Isolation Panel [NHANES]    Frequency of Communication with Friends and Family: More than three times a week    Frequency of Social Gatherings with Friends and Family: More than three times a week    Attends Religious Services: 1 to 4 times per year    Active Member of [employer] or Organizations: No    Attends Banker Meetings: Never    Marital Status: Widowed    Allergies:  Allergies  Allergen Reactions   Penicillins Rash    Childhood allergy, more than 30 years ago. No hospital required.    Metabolic Disorder Labs: Lab Results  Component Value Date   HGBA1C 5.9 (H) 10/16/2022   Lab Results  Component Value Date   PROLACTIN 10.4 09/26/2021   Lab Results  Component Value Date   CHOL 179 10/16/2022   TRIG 98 10/16/2022   HDL 57 10/16/2022   CHOLHDL 3.5 03/14/2021   VLDL 18 01/02/2017   LDLCALC 104 (H) 10/16/2022   LDLCALC 75 04/17/2022   Lab Results  Component Value Date   TSH 0.968 04/17/2022   TSH 1.020 03/14/2021    Therapeutic Level Labs: Lab Results  Component Value Date   LITHIUM 0.61 12/08/2015   LITHIUM 0.54 (L) 12/05/2015   Lab Results  Component Value Date   VALPROATE 43 (L) 06/25/2017   VALPROATE 94 04/20/2016   Lab Results  Component Value Date   CBMZ 9.4 10/16/2022    Current Medications: Current Outpatient Medications  Medication Sig Dispense Refill   ARIPiprazole (ABILIFY) 5 MG tablet Take 1 tablet (5 mg total) by mouth daily. 30 tablet 3   aspirin EC 81 MG tablet Take 81 mg by mouth daily.     carbamazepine (TEGRETOL) 200 MG tablet Take 1 tablet  (200 mg total) by mouth 3 (three) times daily. 90 tablet 3   gabapentin (NEURONTIN) 300 MG capsule Take 1 capsule by mouth in the morning, at noon, in the evening, and at bedtime.     gabapentin (NEURONTIN) 300 MG capsule Take by mouth.     Multiple Vitamins-Minerals (ONE DAILY CALCIUM/IRON) TABS Take by mouth.  omeprazole (PRILOSEC) 40 MG capsule TAKE 1 CAPSULE BY MOUTH DAILY 90 capsule 3   propranolol (INDERAL) 10 MG tablet Take 10 mg by mouth as needed (Take 1 tablet (10 mg total) by mouth Once PRN for up to 1 dose.).     propranolol (INDERAL) 10 MG tablet TAKE 1 TABLET BY MOUTH 3 TIMES DAILY 90 tablet 2   propranolol (INDERAL) 10 MG tablet TAKE 1 TABLET BY MOUTH 3 TIMES DAILY 90 tablet 4   rosuvastatin (CRESTOR) 20 MG tablet Take 1 tablet (20 mg total) by mouth daily. (Patient not taking: Reported on 01/20/2023) 90 tablet 4   tamsulosin (FLOMAX) 0.4 MG CAPS capsule TAKE 1 CAPSULE BY MOUTH DAILY 90 capsule 4   No current facility-administered medications for this visit.     Musculoskeletal: Strength & Muscle Tone: within normal limits Gait & Station:  uses a cane Patient leans: N/A  Psychiatric Specialty Exam: Review of Systems  Psychiatric/Behavioral:  Positive for sleep disturbance. Negative for agitation, behavioral problems, confusion, decreased concentration, dysphoric mood, hallucinations, self-injury and suicidal ideas. The patient is not nervous/anxious and is not hyperactive.   All other systems reviewed and are negative.   Blood pressure (!) 161/97, pulse 73, temperature 98.5 F (36.9 C), temperature source Skin, height 5' 6.93" (1.7 m), weight 172 lb 6.4 oz (78.2 kg).Body mass index is 27.06 kg/m.  General Appearance: Well Groomed  Eye Contact:  Good  Speech:  Clear and Coherent  Volume:  Normal  Mood:   good  Affect:  Appropriate, Congruent, and Full Range  Thought Process:  Coherent  Orientation:  Full (Time, Place, and Person)  Thought Content: Logical    Suicidal Thoughts:  No  Homicidal Thoughts:  No  Memory:  Immediate;   Good  Judgement:  Good  Insight:  Good  Psychomotor Activity:  Normal  Concentration:  Concentration: Good and Attention Span: Good  Recall:  Good  Fund of Knowledge: Good  Language: Good  Akathisia:  No  Handed:  Right  AIMS (if indicated): not done  Assets:  Communication Skills Desire for Improvement  ADL's:  Intact  Cognition: WNL  Sleep:  Good   Screenings: AIMS    Flowsheet Row Admission (Discharged) from 12/01/2015 in [facility]  AIMS Total Score 0      AUDIT    Flowsheet Row Admission (Discharged) from 12/01/2015 in [facility]PAT[facility]HAV[facility]ICINE  Alcohol Use Disorder Identification Test Final Score (AUDIT) 0      GAD-7    Flowsheet Row Office Visit from 10/16/2022 in [facility] Office Visit from 04/17/2022 in [facility] Office Visit from 03/21/2022 in [facility] Office Visit from 01/22/2022 in [facility] Office Visit from 11/22/2021 in [facility]  Total GAD-7 Score 4 0 0 0 0      PHQ2-9    Flowsheet Row Office Visit from 10/16/2022 in [facility] Office Visit from 04/17/2022 in [facility] Office Visit from 03/21/2022 in [facility] Office Visit from 01/22/2022 in [facility] Clinical Support from 01/07/2022 in [facility]  PHQ-2 Total Score 0 0 0 0 0  PHQ-9 Total Score 0 0 0 0 0      Flowsheet Row Office Visit from 03/21/2022 in [facility] Office Visit from 01/22/2022 in [facility]  C-SSRS RISK CATEGORY No Risk Error: Q3, 4, or 5 should not be populated when Q2 is No         Assessment and Plan:  Refujio Haymer is a 77 y.o. year old male with a history of bipolar I disorder, hypertension, GERD, CKD stage III, dug induced tremor, who presents for follow up appointment for below.  1. 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 no mood symptoms since the last admission in 2017, previous admission x5 for mania, was under the care of [facility]  He denies any significant mood symptoms or psychotic symptoms since the last visit.  He is looking forward to living value congruent life, and reports great relationship with his family.  Will continue current dose of carbamazepine and Abilify to target bipolar disorder.   2. High risk medication use Carbamazepine is within therapeutic range.  He was advised to obtain basic labs every 6 months through his PCP (based on his preference) as outlined as below.    # Hypertension Although his blood pressure is consistently elevated at our office, he is not in acute distress, and his home BP is reportedly in normal range. He is under the care of his PCP.    Plan (he asks monthly refill only) Continue carbamazepine 200 mg three times a day (9.4 10/2022, LFT wnl 10/2022) Continue Abilify 5 mg daily (EKG QTc 374 msec, HR 68, 04/1022) - monthly refill Next appointment: 2/10 at 4 pm, IP Obtain lab (CBC, CMP)  through her PCP -on  propranolol 10 mg three times a day, gabapentin 300-300-600 mg for tremors   The patient demonstrates the following risk factors for suicide: Chronic risk factors for suicide include: psychiatric disorder of bipolar disorder . Acute risk factors for suicide include: unemployment. Protective factors for this patient include: positive social support, responsibility to others (children, family), coping skills, and hope for the future. Considering  these factors, the overall suicide risk at this point appears to be low. Patient is appropriate for outpatient follow up.   Collaboration of Care: Collaboration of Care: Other reviewed notes in Epic  Patient/Guardian was advised Release of Information must be obtained prior to any record release in order to collaborate their care with an outside provider. Patient/Guardian was advised if they have not already done so to contact the registration department to sign all necessary forms in order for [place] to release information regarding their care.   Consent: Patient/Guardian gives verbal consent for treatment and assignment of benefits for services provided during this visit. Patient/Guardian expressed understanding and agreed to proceed.   The duration of the time spent on the following activities on the date of the encounter was 30 minutes.   Preparing to see the patient (e.g., review of test, records)  Obtaining and/or reviewing separately obtained history  Performing a medically necessary exam and/or evaluation  Counseling and educating the patient/family/caregiver  Ordering medications, tests, or procedures  Referring and communicating with other healthcare professionals (when not reported separately)  Documenting clinical information in the electronic or paper health record  Independently interpreting results of tests/labs and communication of results to the family or caregiver  Care coordination (when not reported separately)   Neysa Hotter, MD 01/20/2023, 4:39 PM

## 2023-01-20 ENCOUNTER — Ambulatory Visit: Payer: Medicare Other | Admitting: Psychiatry

## 2023-01-20 ENCOUNTER — Encounter: Payer: Self-pay | Admitting: Psychiatry

## 2023-01-20 VITALS — BP 161/97 | HR 73 | Temp 98.5°F | Ht 66.93 in | Wt 172.4 lb

## 2023-01-20 DIAGNOSIS — F319 Bipolar disorder, unspecified: Secondary | ICD-10-CM

## 2023-01-20 DIAGNOSIS — Z79899 Other long term (current) drug therapy: Secondary | ICD-10-CM

## 2023-01-20 MED ORDER — ARIPIPRAZOLE 5 MG PO TABS: 5.0000 mg | ORAL_TABLET | Freq: Every day | ORAL | 3 refills | Status: DC

## 2023-01-20 MED ORDER — CARBAMAZEPINE 200 MG PO TABS: 200.0000 mg | ORAL_TABLET | Freq: Three times a day (TID) | ORAL | 3 refills | Status: DC

## 2023-01-20 NOTE — Patient Instructions (Addendum)
Continue carbamazepine 200 mg three times a day  Continue Abilify 5 mg daily Next appointment: 2/10 at 4 pm Obtain lab (CBC, CMP)  at the next visit with your primary care provider

## 2023-04-19 NOTE — Patient Instructions (Signed)
 Food Basics for Chronic Kidney Disease Chronic kidney disease (CKD) is when your kidneys are not working well. They cannot remove waste, fluids, and other substances from your blood the way they should. These substances can build up, which can worsen kidney damage and affect how your body works. Eating certain foods can lead to a buildup of these substances. Changing your diet can help prevent more kidney damage. Diet changes may also delay dialysis or even keep you from needing it. What nutrients should I limit? Work with your treatment team and a food expert (dietitian) to make a meal plan that's right for you. Foods you can eat and foods you should limit or avoid will depend on the stage of your kidney disease and any other health conditions you have. The items listed below are not a complete list. Talk with your dietitian to learn what is best for you. Potassium Potassium affects how steadily your heart beats. Too much potassium in your blood can cause an irregular heartbeat or even a heart attack. You may need to limit foods that are high in potassium, such as: Liquid milk and soy milk. Salt substitutes that contain potassium. Fruits like bananas, apricots, nectarines, melon, prunes, raisins, kiwi, and oranges. Vegetables, such as potatoes, sweet potatoes, yams, tomatoes, leafy greens, beets, avocado, pumpkin, and winter squash. Beans, like lima beans. Nuts. Phosphorus Phosphorus is a mineral found in your bones. You need a balance between calcium and phosphorus to build and maintain healthy bones. Too much added phosphorus from the foods you eat can pull calcium from your bones. Losing calcium can make your bones weak and more likely to break. Too much phosphorus can also make your skin itch. You may need to limit foods that are high in phosphorus or that have added phosphorus, such as: Liquid milk and dairy products. Dark-colored sodas or soft drinks. Bran cereals and  oatmeal. Protein  Protein helps you make and keep muscle. Protein also helps to repair your body's cells and tissues. One of the natural breakdown products of protein is a waste product called urea. When your kidneys are not working well, they cannot remove types of waste like urea. Reducing protein in your diet can help keep urea from building up in your blood. Depending on your stage of kidney disease, you may need to eat smaller portions of foods that are high in protein. Sources of animal protein include: Meat (all types). Fish and seafood. Poultry. Eggs. Dairy. Other protein foods include: Beans and legumes. Nuts and nut butter. Soy, like tofu.  Sodium Salt (sodium) helps to keep a healthy balance of fluids in your body. Too much salt can increase your blood pressure, which can harm your heart and lungs. Extra salt can also cause your body to keep too much fluid, making your kidneys work harder. You may need to limit or avoid foods that are high in salt, such as: Salt seasonings. Soy and teriyaki sauce. Packaged, precooked, cured, or processed meats, such as sausages or meat loaves. Sardines. Salted crackers and snack foods. Fast food. Canned soups and most canned foods. Pickled foods. Vegetable juice. Boxed mixes or ready-to-eat boxed meals and side dishes. Bottled dressings, sauces, and marinades. Talk with your dietitian about how much potassium, phosphorus, protein, and salt you may have each day. Helpful tips Read food labels  Check the amount of salt in foods. Limit foods that have salt or sodium listed among the first five ingredients. Try to eat low-salt foods. Check the ingredient list  for added phosphorus or potassium. "Phos" in an ingredient is a sign that phosphorus has been added. Do not buy foods that are calcium-enriched or that have calcium added to them (are fortified). Buy canned vegetables and beans that say "no salt added" and rinse them before  eating. Lifestyle Limit the amount of protein you eat from animal sources each day. Focus on protein from plant sources, like tofu and dried beans, peas, and lentils. Do not add salt to food when cooking or before eating. Do not eat star fruit. It can be toxic for people with kidney problems. Talk with your health care provider before taking any vitamin or mineral supplements. If told by your health care provider, track how much liquid you drink so you can avoid drinking too much. You may need to include foods you eat that are made mostly from water, like gelatin, ice cream, soups, and juicy fruits and vegetables. If you have diabetes: If you have diabetes (diabetes mellitus) and CKD, you need to keep your blood sugar (glucose) in the target range recommended by your health care provider. Follow your diabetes management plan. This may include: Checking your blood glucose regularly. Taking medicines by mouth, or taking insulin, or both. Exercising for at least 30 minutes on 5 or more days each week, or as told by your health care provider. Tracking how many servings of carbohydrates you eat at each meal. Not using orange juice to treat low blood sugars. Instead, use apple juice, cranberry juice, or clear soda. You may be given guidelines on what foods and nutrients you may eat, and how much you can have each day. This depends on your stage of kidney disease and whether you have high blood pressure (hypertension). Follow the meal plan your dietitian gives you. To learn more: [employer] of Diabetes and Digestive and Kidney Diseases: [employer]: kidney.org Summary Chronic kidney disease (CKD) is when your kidneys are not working well. They cannot remove waste, fluids, and other substances from your blood the way they should. These substances can build up, which can worsen kidney damage and affect how your body works. Changing your diet can help prevent more  kidney damage. Diet changes may also delay dialysis or even keep you from needing it. Diet changes are different for each person with CKD. Work with a dietitian to set up a meal plan that is right for you. This information is not intended to replace advice given to you by your health care provider. Make sure you discuss any questions you have with your health care provider. Document Revised: 07/12/2021 Document Reviewed: 07/19/2019 Elsevier Patient Education  2024 [employer].

## 2023-04-22 ENCOUNTER — Ambulatory Visit (INDEPENDENT_AMBULATORY_CARE_PROVIDER_SITE_OTHER): Payer: Medicare Other | Admitting: Nurse Practitioner

## 2023-04-22 ENCOUNTER — Encounter: Payer: Self-pay | Admitting: Nurse Practitioner

## 2023-04-22 VITALS — BP 132/80 | HR 75 | Temp 97.8°F | Ht 67.9 in | Wt 166.4 lb

## 2023-04-22 DIAGNOSIS — R7309 Other abnormal glucose: Secondary | ICD-10-CM | POA: Diagnosis not present

## 2023-04-22 DIAGNOSIS — K219 Gastro-esophageal reflux disease without esophagitis: Secondary | ICD-10-CM

## 2023-04-22 DIAGNOSIS — I10 Essential (primary) hypertension: Secondary | ICD-10-CM

## 2023-04-22 DIAGNOSIS — E782 Mixed hyperlipidemia: Secondary | ICD-10-CM | POA: Diagnosis not present

## 2023-04-22 DIAGNOSIS — N1831 Chronic kidney disease, stage 3a: Secondary | ICD-10-CM

## 2023-04-22 DIAGNOSIS — F3112 Bipolar disorder, current episode manic without psychotic features, moderate: Secondary | ICD-10-CM

## 2023-04-22 DIAGNOSIS — G251 Drug-induced tremor: Secondary | ICD-10-CM | POA: Diagnosis not present

## 2023-04-22 LAB — BAYER DCA HB A1C WAIVED: HB A1C (BAYER DCA - WAIVED): 5.6 % (ref 4.8–5.6)

## 2023-04-22 LAB — MICROALBUMIN, URINE WAIVED
Creatinine, Urine Waived: 10 mg/dL (ref 10–300)
Microalb, Ur Waived: 30 mg/L — ABNORMAL HIGH (ref 0–19)
Microalb/Creat Ratio: >300 mg/g — ABNORMAL HIGH (ref <30)

## 2023-04-22 MED ORDER — ROSUVASTATIN CALCIUM 20 MG PO TABS: 20.0000 mg | ORAL_TABLET | Freq: Every day | ORAL | Status: DC

## 2023-04-22 NOTE — Progress Notes (Signed)
 BP 132/80 (BP Location: Left Arm, Patient Position: Sitting, Cuff Size: Normal)   Pulse 75   Temp 97.8 F (36.6 C) (Oral)   Ht 5' 7.9 (1.725 m)   Wt 166 lb 6.4 oz (75.5 kg)   SpO2 95%   BMI 25.38 kg/m    Subjective:    Patient ID: Tony Fowler, male    DOB: 1945-06-10, 78 y.o.   MRN: [id number]  HPI: Tony Fowler is a 78 y.o. male  Chief Complaint  Patient presents with   Hypertension   Anxiety   Chronic Kidney Disease   Manic Behavior   HYPERTENSION / HYPERLIPIDEMIA Taking Propranolol  10 MG TID (tremor and BP), Crestor  40 MG daily (has not been taking recently as thought he was not supposed to take), ASA.  Follows neurology for drug-induced tremor, last seen 09/26/22, plan to continue Gabapentin  300 MG QID and Propranolol  10 MG TID.    Quit smoking on March 7th, 2020. He had smoked since 78 years old, 1 PPD per day.  Declines lung CT cancer screening. Satisfied with current treatment? yes Duration of hypertension: chronic BP monitoring frequency: daily BP range: 130/80 range at home on average -- has white coat due to past involuntary commitment and that event process -- uses Omron BP medication side effects: no Duration of hyperlipidemia: chronic Cholesterol medication side effects: no Cholesterol supplements: none Medication compliance: good compliance Aspirin : yes Recent stressors: no Recurrent headaches: no Visual changes: no Palpitations: no Dyspnea: no Chest pain: no Lower extremity edema: no Dizzy/lightheaded: no The 10-year ASCVD risk score (Arnett DK, et al., 2019) is: 29.5%   Values used to calculate the score:     Age: 16 years     Sex: Male     Is Non-Hispanic African American: No     Diabetic: No     Tobacco smoker: No     Systolic Blood Pressure: 132 mmHg     Is BP treated: Yes     HDL Cholesterol: 57 mg/dL     Total Cholesterol: 179 mg/dL  CHRONIC KIDNEY DISEASE (CKD 3a) Recent levels remain stable CKD 3a. CKD status:  stable Medications renally dose: yes Previous renal evaluation: no Pneumovax:  Up to Date Influenza Vaccine:  Up To Date  Impaired Fasting Glucose HbA1C:  Lab Results  Component Value Date   HGBA1C 5.9 (H) 10/16/2022  Duration of elevated blood sugar:  Polydipsia: no Polyuria: no Weight change: no Visual disturbance: no Glucose Monitoring: no    Accucheck frequency: Not Checking    Fasting glucose:     Post prandial:  Diabetic Education: Not Completed Family history of diabetes: no   BIPOLAR AFFECTIVE DISORDER: Follows by psychiatry and last visit 09/20/22 -- Dr. Vickey. Continues on Abilify  5 MG daily and Tegretol  200 MG TID.  Continues on Omeprazole  for GERD with benefit. Mood status: stable Satisfied with current treatment?: yes Symptom severity: mild  Duration of current treatment : chronic Side effects: no Medication compliance: good compliance Psychotherapy/counseling: current Depressed mood: no Anxious mood:  occasional Anhedonia: no Significant weight loss or gain: no Insomnia: no Fatigue: no Feelings of worthlessness or guilt: no Impaired concentration/indecisiveness: no Suicidal ideations: no Hopelessness: no Crying spells: no    10/16/2022    9:49 AM 04/17/2022    8:30 AM 03/21/2022    3:11 PM 01/22/2022    2:15 PM 01/07/2022    9:14 AM  Depression screen PHQ 2/9  Decreased Interest 0 0   0  Down, Depressed, Hopeless  0 0   0  PHQ - 2 Score 0 0   0  Altered sleeping 0 0   0  Tired, decreased energy 0 0   0  Change in appetite 0 0   0  Feeling bad or failure about yourself  0 0   0  Trouble concentrating 0 0   0  Moving slowly or fidgety/restless 0 0   0  Suicidal thoughts 0 0   0  PHQ-9 Score 0 0   0  Difficult doing work/chores Not difficult at all Not difficult at all   Not difficult at all     Information is confidential and restricted. Go to Review Flowsheets to unlock data.       10/16/2022    9:49 AM 04/17/2022    8:31 AM 03/21/2022     3:12 PM 01/22/2022    2:16 PM  GAD 7 : Generalized Anxiety Score  Nervous, Anxious, on Edge 1 0    Control/stop worrying 1 0    Worry too much - different things 1 0    Trouble relaxing 0 0    Restless 0 0    Easily annoyed or irritable 1 0    Afraid - awful might happen 0 0    Total GAD 7 Score 4 0    Anxiety Difficulty Not difficult at all Not difficult at all       Information is confidential and restricted. Go to Review Flowsheets to unlock data.     Relevant past medical, surgical, family and social history reviewed and updated as indicated. Interim medical history since our last visit reviewed. Allergies and medications reviewed and updated.  Review of Systems  Constitutional:  Negative for activity change, diaphoresis, fatigue and fever.  Respiratory:  Negative for cough, chest tightness, shortness of breath and wheezing.   Cardiovascular:  Negative for chest pain, palpitations and leg swelling.  Gastrointestinal: Negative.   Endocrine: Negative.   Neurological: Negative.   Psychiatric/Behavioral: Negative.      Per HPI unless specifically indicated above     Objective:    BP 132/80 (BP Location: Left Arm, Patient Position: Sitting, Cuff Size: Normal)   Pulse 75   Temp 97.8 F (36.6 C) (Oral)   Ht 5' 7.9 (1.725 m)   Wt 166 lb 6.4 oz (75.5 kg)   SpO2 95%   BMI 25.38 kg/m   Wt Readings from Last 3 Encounters:  04/22/23 166 lb 6.4 oz (75.5 kg)  10/16/22 170 lb 9.6 oz (77.4 kg)  04/17/22 168 lb 6.4 oz (76.4 kg)    Physical Exam Vitals and nursing note reviewed.  Constitutional:      General: He is awake. He is not in acute distress.    Appearance: He is well-developed, well-groomed and overweight. He is not ill-appearing.  HENT:     Head: Normocephalic and atraumatic.     Right Ear: Hearing normal. No drainage.     Left Ear: Hearing normal. No drainage.  Eyes:     General: Lids are normal.        Right eye: No discharge.        Left eye: No discharge.      Conjunctiva/sclera: Conjunctivae normal.     Pupils: Pupils are equal, round, and reactive to light.  Neck:     Thyroid : No thyromegaly.     Vascular: No carotid bruit.     Trachea: Trachea normal.  Cardiovascular:     Rate and Rhythm:  Normal rate and regular rhythm.     Heart sounds: Normal heart sounds, S1 normal and S2 normal. No murmur heard.    No gallop.  Pulmonary:     Effort: Pulmonary effort is normal. No accessory muscle usage or respiratory distress.     Breath sounds: Normal breath sounds.  Abdominal:     General: Bowel sounds are normal.     Palpations: Abdomen is soft.  Musculoskeletal:        General: Normal range of motion.     Cervical back: Normal range of motion and neck supple.     Right lower leg: No edema.     Left lower leg: No edema.  Skin:    General: Skin is warm and dry.     Capillary Refill: Capillary refill takes less than 2 seconds.     Findings: No rash.  Neurological:     Mental Status: He is alert and oriented to person, place, and time.     Deep Tendon Reflexes: Reflexes are normal and symmetric.  Psychiatric:        Attention and Perception: Attention normal.        Mood and Affect: Mood normal.        Speech: Speech normal.        Behavior: Behavior normal. Behavior is cooperative.        Thought Content: Thought content normal.    Results for orders placed or performed in visit on 10/16/22  Bayer DCA Hb A1c Waived   Collection Time: 10/16/22  9:07 AM  Result Value Ref Range   HB A1C (BAYER DCA - WAIVED) 5.9 (H) 4.8 - 5.6 %  Comprehensive metabolic panel   Collection Time: 10/16/22  9:09 AM  Result Value Ref Range   Glucose 113 (H) 70 - 99 mg/dL   BUN 22 8 - 27 mg/dL   Creatinine, Ser 8.62 (H) 0.76 - 1.27 mg/dL   eGFR 54 (L) >40 fO/fpw/8.26   BUN/Creatinine Ratio 16 10 - 24   Sodium 137 134 - 144 mmol/L   Potassium 4.4 3.5 - 5.2 mmol/L   Chloride 102 96 - 106 mmol/L   CO2 21 20 - 29 mmol/L   Calcium  9.5 8.6 - 10.2 mg/dL    Total Protein 6.5 6.0 - 8.5 g/dL   Albumin 4.1 3.8 - 4.8 g/dL   Globulin, Total 2.4 1.5 - 4.5 g/dL   Bilirubin Total 0.2 0.0 - 1.2 mg/dL   Alkaline Phosphatase 113 44 - 121 IU/L   AST 28 0 - 40 IU/L   ALT 22 0 - 44 IU/L  Lipid Panel w/o Chol/HDL Ratio   Collection Time: 10/16/22  9:09 AM  Result Value Ref Range   Cholesterol, Total 179 100 - 199 mg/dL   Triglycerides 98 0 - 149 mg/dL   HDL 57 >60 mg/dL   VLDL Cholesterol Cal 18 5 - 40 mg/dL   LDL Chol Calc (NIH) 895 (H) 0 - 99 mg/dL  Carbamazepine  Level (Tegretol ), total   Collection Time: 10/16/22  9:58 AM  Result Value Ref Range   Carbamazepine  (Tegretol ), S 9.4 4.0 - 12.0 ug/mL      Assessment & Plan:   Problem List Items Addressed This Visit       Cardiovascular and Mediastinum   Benign hypertension (Chronic)   Chronic, ongoing.  Initial BP elevated, but repeat improved and well below goal. Known white coat due to PTSD from past involuntary commitment and provider settings.  Home BP's average  at goal for age, he checks regularly.  Have recommended adding on a low dose of Losartan with his CKD, would avoid ACE as suspect some underlying COPD.  He refuses starting new medication.  Will continue Propranolol  for BP and tremor.  Recommend he monitor BP at home daily and document for provider visits + focus on DASH diet.  LABS: CBC, CMP, urine ALB.  Refills up to date.  Return in 6 months.      Relevant Medications   rosuvastatin  (CRESTOR ) 20 MG tablet   Other Relevant Orders   CBC with Differential/Platelet   Comprehensive metabolic panel   TSH     Digestive   GERD (gastroesophageal reflux disease)   Chronic, stable with Protonix .  Continue current regimen and consider trial reduction in future, if tolerated would consider discontinuation.  Risks of PPI use were discussed with patient including bone loss, C. Diff diarrhea, pneumonia, infections, CKD, electrolyte abnormalities.  Verbalizes understanding and chooses to continue  the medication. Mag level today.  Return in 6 months.      Relevant Orders   Magnesium      Nervous and Auditory   Drug-induced tremor   Chronic, stable.  Will continue Propranolol , refills up to date, and continue collaboration with neurology as needed.  Recent note reviewed.        Genitourinary   CKD (chronic kidney disease) stage 3, GFR 30-59 ml/min (HCC)   Chronic, ongoing.  Remaining stable on recent labs.  Recommend Losartan or alternate ARB addition for kidney protection.  He refuses to add or change medications.  Recheck CMP.      Relevant Orders   Microalbumin, Urine Waived   Comprehensive metabolic panel     Other   Mixed hyperlipidemia (Chronic)   Chronic, ongoing.  Continue current medication regimen, recommend he restart, and adjust as needed.  CMP and lipid panel today.  Monitor closely as Tegretol  can reduce effect of Rosuvastatin . Return in 6 months.      Relevant Medications   rosuvastatin  (CRESTOR ) 20 MG tablet   Other Relevant Orders   Comprehensive metabolic panel   Lipid Panel w/o Chol/HDL Ratio   Bipolar disorder, current episode manic without psychotic features, moderate (HCC) - Primary   Chronic, stable.  Denies SI/HI.  Continue collaboration with psychiatry and regimen as prescribed by them.   Recent note reviewed, obtain CBC and CMP.      Elevated hemoglobin A1c measurement   History of elevation in A1c -- will recheck today and monitor.  Has been stable for years in 5 range.      Relevant Orders   Bayer DCA Hb A1c Waived     Follow up plan: Return in about 6 months (around 10/20/2023) for HTN/HLD , MOOD, CKD  + needs Medicare Wellness nurse visit.

## 2023-04-22 NOTE — Assessment & Plan Note (Signed)
 Chronic, stable.  Denies SI/HI.  Continue collaboration with psychiatry and regimen as prescribed by them.   Recent note reviewed, obtain CBC and CMP.

## 2023-04-22 NOTE — Assessment & Plan Note (Signed)
 Chronic, ongoing.  Initial BP elevated, but repeat improved and well below goal. Known white coat due to PTSD from past involuntary commitment and provider settings.  Home BP's average at goal for age, he checks regularly.  Have recommended adding on a low dose of Losartan with his CKD, would avoid ACE as suspect some underlying COPD.  He refuses starting new medication.  Will continue Propranolol  for BP and tremor.  Recommend he monitor BP at home daily and document for provider visits + focus on DASH diet.  LABS: CBC, CMP, urine ALB.  Refills up to date.  Return in 6 months.

## 2023-04-22 NOTE — Assessment & Plan Note (Signed)
 Chronic, ongoing.  Remaining stable on recent labs.  Recommend Losartan or alternate ARB addition for kidney protection.  He refuses to add or change medications.  Recheck CMP.

## 2023-04-22 NOTE — Assessment & Plan Note (Signed)
Chronic, stable.  Will continue Propranolol, refills up to date, and continue collaboration with neurology as needed.  Recent note reviewed.

## 2023-04-22 NOTE — Assessment & Plan Note (Signed)
History of elevation in A1c -- will recheck today and monitor.  Has been stable for years in 5 range.

## 2023-04-22 NOTE — Assessment & Plan Note (Addendum)
 Chronic, ongoing.  Continue current medication regimen, recommend he restart, and adjust as needed.  CMP and lipid panel today.  Monitor closely as Tegretol can reduce effect of Rosuvastatin. Return in 6 months.

## 2023-04-22 NOTE — Assessment & Plan Note (Signed)
Chronic, stable with Protonix.  Continue current regimen and consider trial reduction in future, if tolerated would consider discontinuation.  Risks of PPI use were discussed with patient including bone loss, C. Diff diarrhea, pneumonia, infections, CKD, electrolyte abnormalities.  Verbalizes understanding and chooses to continue the medication. Mag level today.  Return in 6 months. 

## 2023-04-23 LAB — CBC WITH DIFFERENTIAL/PLATELET
Basophils Absolute: 0.1 10*3/uL (ref 0.0–0.2)
Basos: 1 %
EOS (ABSOLUTE): 0.1 10*3/uL (ref 0.0–0.4)
Eos: 2 %
Hematocrit: 43.2 % (ref 37.5–51.0)
Hemoglobin: 14.4 g/dL (ref 13.0–17.7)
Immature Grans (Abs): 0 10*3/uL (ref 0.0–0.1)
Immature Granulocytes: 0 %
Lymphocytes Absolute: 1.3 10*3/uL (ref 0.7–3.1)
Lymphs: 20 %
MCH: 31.9 pg (ref 26.6–33.0)
MCHC: 33.3 g/dL (ref 31.5–35.7)
MCV: 96 fL (ref 79–97)
Monocytes Absolute: 0.5 10*3/uL (ref 0.1–0.9)
Monocytes: 7 %
Neutrophils Absolute: 4.4 10*3/uL (ref 1.4–7.0)
Neutrophils: 70 %
Platelets: 271 10*3/uL (ref 150–450)
RBC: 4.51 x10E6/uL (ref 4.14–5.80)
RDW: 12.8 % (ref 11.6–15.4)
WBC: 6.4 10*3/uL (ref 3.4–10.8)

## 2023-04-23 LAB — TSH: TSH: 0.789 u[IU]/mL (ref 0.450–4.500)

## 2023-04-23 LAB — COMPREHENSIVE METABOLIC PANEL
ALT: 23 [IU]/L (ref 0–44)
AST: 30 [IU]/L (ref 0–40)
Albumin: 4.1 g/dL (ref 3.8–4.8)
Alkaline Phosphatase: 117 [IU]/L (ref 44–121)
BUN/Creatinine Ratio: 16 (ref 10–24)
BUN: 24 mg/dL (ref 8–27)
Bilirubin Total: 0.4 mg/dL (ref 0.0–1.2)
CO2: 20 mmol/L (ref 20–29)
Calcium: 9.7 mg/dL (ref 8.6–10.2)
Chloride: 104 mmol/L (ref 96–106)
Creatinine, Ser: 1.48 mg/dL — ABNORMAL HIGH (ref 0.76–1.27)
Globulin, Total: 2.6 g/dL (ref 1.5–4.5)
Glucose: 107 mg/dL — ABNORMAL HIGH (ref 70–99)
Potassium: 4.8 mmol/L (ref 3.5–5.2)
Sodium: 139 mmol/L (ref 134–144)
Total Protein: 6.7 g/dL (ref 6.0–8.5)
eGFR: 49 mL/min/{1.73_m2} — ABNORMAL LOW (ref >59)

## 2023-04-23 LAB — LIPID PANEL W/O CHOL/HDL RATIO
Cholesterol, Total: 311 mg/dL — ABNORMAL HIGH (ref 100–199)
HDL: 57 mg/dL (ref >39)
LDL Chol Calc (NIH): 228 mg/dL — ABNORMAL HIGH (ref 0–99)
Triglycerides: 142 mg/dL (ref 0–149)
VLDL Cholesterol Cal: 26 mg/dL (ref 5–40)

## 2023-04-23 LAB — MAGNESIUM: Magnesium: 2 mg/dL (ref 1.6–2.3)

## 2023-04-23 NOTE — Progress Notes (Signed)
 Contacted via MyChart   Good evening Tony Fowler, your labs have returned: - Kidney function continues to show Stage 3a kidney disease with no worsening.  Continue to hydrate well at home and avoid any Ibuprofen use. - Lipid panel shows elevation, as we discussed restart your Rosuvastatin  to help prevent stroke. - Remainder of labs are all stable.  Any questions? Keep being amazing!!  Thank you for allowing me to participate in your care.  I appreciate you. Kindest regards, Jhonny Calixto

## 2023-04-24 ENCOUNTER — Other Ambulatory Visit: Payer: Self-pay | Admitting: Nurse Practitioner

## 2023-04-24 ENCOUNTER — Encounter: Payer: Self-pay | Admitting: Nurse Practitioner

## 2023-04-24 MED ORDER — ROSUVASTATIN CALCIUM 20 MG PO TABS: 20.0000 mg | ORAL_TABLET | Freq: Every day | ORAL | 1 refills | Status: DC

## 2023-04-24 NOTE — Telephone Encounter (Signed)
Routing to provider  

## 2023-04-24 NOTE — Telephone Encounter (Signed)
Copied from CRM [id number]. Topic: General - Inquiry >> Apr 24, 2023  9:07 AM Haroldine Laws wrote: Reason for CRM: pt was in the office Tuesday and she said he has not gotten his cholesterol medication.  [facility] [employer].  He is out of the medication.  Crestor generic.Marland Kitchen Please advise pt when sent over.

## 2023-05-13 ENCOUNTER — Ambulatory Visit: Payer: Medicare Other

## 2023-05-13 VITALS — BP 130/82 | Ht 67.0 in | Wt 166.8 lb

## 2023-05-13 DIAGNOSIS — Z0000 Encounter for general adult medical examination without abnormal findings: Secondary | ICD-10-CM | POA: Diagnosis not present

## 2023-05-13 NOTE — Patient Instructions (Addendum)
 Tony Fowler , Thank you for taking time to come for your Medicare Wellness Visit. I appreciate your ongoing commitment to your health goals. Please review the following plan we discussed and let me know if I can assist you in the future.   Referrals/Orders/Follow-Ups/Clinician Recommendations: Get a tetanus vaccine at your convenience. Your last one was 12/2012.  This is a list of the screening recommended for you and due dates:  Health Maintenance  Topic Date Due   Screening for Lung Cancer  Never done   COVID-19 Vaccine (5 - 2024-25 season) 12/08/2022   Zoster (Shingles) Vaccine (1 of 2) 07/21/2023*   DTaP/Tdap/Td vaccine (2 - Tdap) 04/21/2024*   Medicare Annual Wellness Visit  05/12/2024   Flu Shot  Completed   Hepatitis C Screening  Completed   HPV Vaccine  Aged Out   Pneumonia Vaccine  Discontinued  *Topic was postponed. The date shown is not the original due date.    Advanced directives: (Copy Requested) Please bring a copy of your health care power of attorney and living will to the office to be added to your chart at your convenience.  Next Medicare Annual Wellness Visit scheduled for next year: Yes, 05/18/24 @ 10:00 am (in person visit)

## 2023-05-13 NOTE — Progress Notes (Signed)
 Subjective:   Tony Fowler is a 78 y.o. male who presents for Medicare Annual/Subsequent preventive examination.  Visit Complete: In person  Patient Medicare AWV questionnaire was completed by the patient on 05/12/23; I have confirmed that all information answered by patient is correct and no changes since this date.  Cardiac Risk Factors include: advanced age (>91men, >20 women);hypertension;dyslipidemia;male gender     Objective:    Today's Vitals   05/13/23 0912  BP: (!) 144/90  Weight: 166 lb 12.8 oz (75.7 kg)  Height: 5' 7 (1.702 m)   Body mass index is 26.12 kg/m.     05/13/2023    9:24 AM 01/07/2022    9:10 AM 01/05/2021    9:00 AM 01/03/2020    9:02 AM 12/23/2018    8:14 AM 06/25/2018    5:37 PM 06/13/2018    6:20 PM  Advanced Directives  Does Patient Have a Medical Advance Directive? Yes Yes Yes Yes Yes No No  Type of Estate Agent of [place];Living will Healthcare Power of [place] of [place];Living will Healthcare Power of [place];Living will Living will;Healthcare Power of Attorney    Does patient want to make changes to medical advance directive? No - Patient declined        Copy of Healthcare Power of Attorney in Chart? No - copy requested No - copy requested No - copy requested No - copy requested No - copy requested    Would patient like information on creating a medical advance directive?       Yes (Inpatient - patient requests chaplain consult to create a medical advance directive)    Current Medications (verified) Outpatient Encounter Medications as of 05/13/2023  Medication Sig   ARIPiprazole  (ABILIFY ) 5 MG tablet Take 1 tablet (5 mg total) by mouth daily.   aspirin  EC 81 MG tablet Take 81 mg by mouth daily.   carbamazepine  (TEGRETOL ) 200 MG tablet Take 1 tablet (200 mg total) by mouth 3 (three) times daily.   gabapentin  (NEURONTIN ) 300 MG capsule 1 in the AM, 1 in the afternoon, and 2 at bedtime   Multiple  Vitamins-Minerals (ONE DAILY CALCIUM /IRON) TABS Take by mouth.   omeprazole  (PRILOSEC) 40 MG capsule TAKE 1 CAPSULE BY MOUTH DAILY   propranolol  (INDERAL ) 10 MG tablet TAKE 1 TABLET BY MOUTH 3 TIMES DAILY   rosuvastatin  (CRESTOR ) 20 MG tablet Take 1 tablet (20 mg total) by mouth daily.   tamsulosin  (FLOMAX ) 0.4 MG CAPS capsule TAKE 1 CAPSULE BY MOUTH DAILY   No facility-administered encounter medications on file as of 05/13/2023.    Allergies (verified) Penicillins   History: Past Medical History:  Diagnosis Date   Bipolar 1 disorder (HCC)    Coarse tremors    Hypertension    Past Surgical History:  Procedure Laterality Date   TONSILECTOMY/ADENOIDECTOMY WITH MYRINGOTOMY     Family History  Problem Relation Age of Onset   Schizophrenia Mother    Bipolar disorder Mother    Heart disease Mother    Hypertension Mother    Mental illness Mother    Heart disease Brother    Hypertension Brother    Social History   Socioeconomic History   Marital status: Divorced    Spouse name: Not on file   Number of children: 2   Years of education: Not on file   Highest education level: Bachelor's degree (e.g., BA, AB, BS)  Occupational History   Occupation: disability/retired  Tobacco Use   Smoking status: Former  Current packs/day: 0.00    Average packs/day: 0.5 packs/day for 52.2 years (26.1 ttl pk-yrs)    Types: Cigarettes    Start date: 69    Quit date: 06/13/2018    Years since quitting: 4.9   Smokeless tobacco: Never  Vaping Use   Vaping status: Never Used  Substance and Sexual Activity   Alcohol use: Yes    Comment: 1 beer occasionally monthly or less   Drug use: No   Sexual activity: Not Currently  Other Topics Concern   Not on file  Social History Narrative   Not on file   Social Drivers of Health   Financial Resource Strain: Low Risk  (05/13/2023)   Overall Financial Resource Strain (CARDIA)    Difficulty of Paying Living Expenses: Not very hard  Food  Insecurity: No Food Insecurity (05/13/2023)   Hunger Vital Sign    Worried About Running Out of Food in the Last Year: Never true    Ran Out of Food in the Last Year: Never true  Transportation Needs: No Transportation Needs (05/13/2023)   PRAPARE - Administrator, Civil Service (Medical): No    Lack of Transportation (Non-Medical): No  Physical Activity: Insufficiently Active (05/13/2023)   Exercise Vital Sign    Days of Exercise per Week: 3 days    Minutes of Exercise per Session: 30 min  Stress: No Stress Concern Present (05/13/2023)   [employer] of Occupational Health - Occupational Stress Questionnaire    Feeling of Stress : Not at all  Social Connections: Socially Isolated (05/13/2023)   Social Connection and Isolation Panel [NHANES]    Frequency of Communication with Friends and Family: More than three times a week    Frequency of Social Gatherings with Friends and Family: More than three times a week    Attends Religious Services: Never    Database Administrator or Organizations: No    Attends Engineer, Structural: Never    Marital Status: Divorced    Tobacco Counseling Counseling given: Not Answered   Clinical Intake:  Pre-visit preparation completed: Yes  Pain : No/denies pain     BMI - recorded: 26.12 Nutritional Status: BMI 25 -29 Overweight Nutritional Risks: None Diabetes: No  How often do you need to have someone help you when you read instructions, pamphlets, or other written materials from your doctor or pharmacy?: 1 - Never  Interpreter Needed?: No  Information entered by :: [name], CMA   Activities of Daily Living    05/13/2023    9:16 AM 05/12/2023    3:11 PM  In your present state of health, do you have any difficulty performing the following activities:  Hearing? 0 0  Vision? 0 0  Difficulty concentrating or making decisions? 0 0  Walking or climbing stairs? 0 0  Dressing or bathing? 0 0  Doing errands, shopping? 0 0   Preparing Food and eating ? N N  Using the Toilet? N N  In the past six months, have you accidently leaked urine? N N  Do you have problems with loss of bowel control? N N  Managing your Medications? N N  Managing your Finances? N N  Housekeeping or managing your Housekeeping? N N    Patient Care Team: Cannady, Jolene T, NP as PCP - General (Nurse Practitioner) Minor, Glynn RAMAN, RN (Inactive) as [facility] Therapist, Music  Indicate any recent Medical Services you may have received from other than [facility] providers in  the past year (date may be approximate).     Assessment:   This is a routine wellness examination for Tony Fowler.  Hearing/Vision screen Hearing Screening - Comments:: Denies hearing loss Vision Screening - Comments:: Does not get eye exams, declined referral   Goals Addressed             This Visit's Progress    Weight (lb) < 160 lb (72.6 kg)   166 lb 12.8 oz (75.7 kg)     Depression Screen    05/13/2023    9:21 AM 10/16/2022    9:49 AM 04/17/2022    8:30 AM 03/21/2022    3:11 PM 01/22/2022    2:15 PM 01/07/2022    9:14 AM 01/07/2022    9:13 AM  PHQ 2/9 Scores  PHQ - 2 Score 0 0 0   0 0  PHQ- 9 Score  0 0   0 0     Information is confidential and restricted. Go to Review Flowsheets to unlock data.     Fall Risk    05/13/2023    9:25 AM 05/12/2023    3:11 PM 04/17/2022    8:30 AM 01/07/2022    9:23 AM 11/22/2021   10:48 AM  Fall Risk   Falls in the past year? 0 0 0 0 0  Number falls in past yr: 0  0 0 0  Injury with Fall? 0 0 0 0 0  Risk for fall due to : No Fall Risks  No Fall Risks  Impaired mobility  Follow up Falls prevention discussed  Falls evaluation completed Falls evaluation completed;Education provided;Falls prevention discussed Falls evaluation completed    MEDICARE RISK AT HOME: Medicare Risk at Home Any stairs in or around the home?: Yes If so, are there any without handrails?: No Home free of loose throw rugs in walkways, pet  beds, electrical cords, etc?: Yes Adequate lighting in your home to reduce risk of falls?: Yes Life alert?: No Use of a cane, walker or w/c?: Yes Grab bars in the bathroom?: Yes Shower chair or bench in shower?: Yes Elevated toilet seat or a handicapped toilet?: No  TIMED UP AND GO:  Was the test performed?  Yes  Length of time to ambulate 10 feet: 4 sec Gait steady and fast with assistive device    Cognitive Function:        05/13/2023    9:25 AM 01/07/2022    9:09 AM 01/05/2021    9:02 AM 01/03/2020    9:04 AM 12/23/2018    8:14 AM  6CIT Screen  What Year? 0 points 0 points 0 points 0 points 0 points  What month? 0 points 0 points 0 points 0 points 0 points  What time? 0 points 0 points 0 points 0 points 0 points  Count back from 20 0 points 0 points 0 points 0 points 0 points  Months in reverse 0 points 0 points 0 points 0 points 0 points  Repeat phrase 0 points 0 points 2 points 2 points 0 points  Total Score 0 points 0 points 2 points 2 points 0 points    Immunizations Immunization History  Administered Date(s) Administered   Fluad Quad(high Dose 65+) 12/23/2018, 12/08/2019, 12/25/2020   Influenza Split 01/08/2008   Influenza, High Dose Seasonal PF 05/16/2016, 01/02/2017, 01/08/2018   Influenza,inj,Quad PF,6+ Mos 02/22/2015   Influenza-Unspecified 02/16/2014, 02/22/2015   PFIZER(Purple Top)SARS-COV-2 Vaccination 05/21/2019, 06/11/2019, 01/24/2020, 07/11/2020   PPD Test 06/18/2018   Pneumococcal Conjugate-13 02/22/2015  Pneumococcal-Unspecified 01/15/2012   Td 12/14/2012    TDAP status: Due, Education has been provided regarding the importance of this vaccine. Advised may receive this vaccine at local pharmacy or Health Dept. Aware to provide a copy of the vaccination record if obtained from local pharmacy or Health Dept. Verbalized acceptance and understanding.  Flu Vaccine status: Up to date  Pneumococcal vaccine status: Up to date  Covid-19 vaccine status:  Declined, Education has been provided regarding the importance of this vaccine but patient still declined. Advised may receive this vaccine at local pharmacy or Health Dept.or vaccine clinic. Aware to provide a copy of the vaccination record if obtained from local pharmacy or Health Dept. Verbalized acceptance and understanding.  Qualifies for Shingles Vaccine? Yes   Zostavax completed No   Shingrix Completed?: No.    Education has been provided regarding the importance of this vaccine. Patient has been advised to call insurance company to determine out of pocket expense if they have not yet received this vaccine. Advised may also receive vaccine at local pharmacy or Health Dept. Verbalized acceptance and understanding. Patient declined  Screening Tests Health Maintenance  Topic Date Due   INFLUENZA VACCINE  11/07/2022   COVID-19 Vaccine (5 - 2024-25 season) 12/08/2022   Zoster Vaccines- Shingrix (1 of 2) 07/21/2023 (Originally 01/10/1965)   DTaP/Tdap/Td (2 - Tdap) 04/21/2024 (Originally 12/15/2022)   Medicare Annual Wellness (AWV)  05/12/2024   Hepatitis C Screening  Completed   HPV VACCINES  Aged Out   Pneumonia Vaccine 49+ Years old  Discontinued    Health Maintenance  Health Maintenance Due  Topic Date Due   INFLUENZA VACCINE  11/07/2022   COVID-19 Vaccine (5 - 2024-25 season) 12/08/2022    Colorectal cancer screening: No longer required.   Lung Cancer Screening: (Low Dose CT Chest recommended if Age 93-80 years, 20 pack-year currently smoking OR have quit w/in 15years.) does qualify.   Lung Cancer Screening Referral: patient declined  Additional Screening:  Hepatitis C Screening: does not qualify; Completed 05/16/16  Vision Screening: Recommended annual ophthalmology exams for early detection of glaucoma and other disorders of the eye. Dental Screening: Recommended annual dental exams for proper oral hygiene    Community Resource Referral / Chronic Care Management: CRR  required this visit?  No   CCM required this visit?  No     Plan:     I have personally reviewed and noted the following in the patient's chart:   Medical and social history Use of alcohol, tobacco or illicit drugs  Current medications and supplements including opioid prescriptions. Patient is not currently taking opioid prescriptions. Functional ability and status Nutritional status Physical activity Advanced directives List of other physicians Hospitalizations, surgeries, and ER visits in previous 12 months Vitals Screenings to include cognitive, depression, and falls Referrals and appointments  In addition, I have reviewed and discussed with patient certain preventive protocols, quality metrics, and best practice recommendations. A written personalized care plan for preventive services as well as general preventive health recommendations were provided to patient.     [name], CMA   05/13/2023   After Visit Summary: (In Person-Printed) AVS printed and given to the patient  Nurse Notes:  Needs Tdap vaccine Declined LDCT Does not get routine eye exams. Declined referral Declined covid and shingles vaccines

## 2023-05-16 NOTE — Progress Notes (Signed)
 BH MD/PA/NP OP Progress Note  05/19/2023 4:24 PM Tony Fowler  MRN:  [id number]  Chief Complaint:  Chief Complaint  Patient presents with   Follow-up   HPI:  This is a follow-up appointment for bipolar 1 disorder.  He states that he has been doing well.  He had a good holiday with his both of his  daughter and their children.  He cooks for himself, his daughter and her fianc and eats together.  He states that he is now 78 years old, noting that the average American life expectancy is 77 years. He is trying the best he can to help his family, and does not have any time to feel depressed.  He denies feeling anxious.  He sleeps 5.5 hours, and feels refreshed.  He denies SI, hallucinations.  He denies decreased need for sleep or euphoria.  He drinks a beer at times.  He denies drug use. No fall since 2017. He expressed understanding to contact the office if he were to experience any worsening in his mood.     Wt Readings from Last 3 Encounters:  05/19/23 168 lb 3.2 oz (76.3 kg)  05/13/23 166 lb 12.8 oz (75.7 kg)  04/22/23 166 lb 6.4 oz (75.5 kg)     Visit Diagnosis:    ICD-10-CM   1. Bipolar I disorder (HCC)  F31.9       Past Psychiatric History: Please see initial evaluation for full details. I have reviewed the history. No updates at this time.     Past Medical History:  Past Medical History:  Diagnosis Date   Bipolar 1 disorder (HCC)    Coarse tremors    Hypertension     Past Surgical History:  Procedure Laterality Date   TONSILECTOMY/ADENOIDECTOMY WITH MYRINGOTOMY      Family Psychiatric History: Please see initial evaluation for full details. I have reviewed the history. No updates at this time.     Family History:  Family History  Problem Relation Age of Onset   Schizophrenia Mother    Bipolar disorder Mother    Heart disease Mother    Hypertension Mother    Mental illness Mother    Heart disease Brother    Hypertension Brother     Social History:  Social  History   Socioeconomic History   Marital status: Divorced    Spouse name: Not on file   Number of children: 2   Years of education: Not on file   Highest education level: Bachelor's degree (e.g., BA, AB, BS)  Occupational History   Occupation: disability/retired  Tobacco Use   Smoking status: Former    Current packs/day: 0.00    Average packs/day: 0.5 packs/day for 52.2 years (26.1 ttl pk-yrs)    Types: Cigarettes    Start date: 79    Quit date: 06/13/2018    Years since quitting: 4.9   Smokeless tobacco: Never  Vaping Use   Vaping status: Never Used  Substance and Sexual Activity   Alcohol use: Yes    Comment: 1 beer occasionally monthly or less   Drug use: No   Sexual activity: Not Currently  Other Topics Concern   Not on file  Social History Narrative   Not on file   Social Drivers of Health   Financial Resource Strain: Low Risk  (05/13/2023)   Overall Financial Resource Strain (CARDIA)    Difficulty of Paying Living Expenses: Not very hard  Food Insecurity: No Food Insecurity (05/13/2023)   Hunger Vital Sign  Worried About Programme researcher, broadcasting/film/video in the Last Year: Never true    Ran Out of Food in the Last Year: Never true  Transportation Needs: No Transportation Needs (05/13/2023)   PRAPARE - Administrator, Civil Service (Medical): No    Lack of Transportation (Non-Medical): No  Physical Activity: Insufficiently Active (05/13/2023)   Exercise Vital Sign    Days of Exercise per Week: 3 days    Minutes of Exercise per Session: 30 min  Stress: No Stress Concern Present (05/13/2023)   [employer] of Occupational Health - Occupational Stress Questionnaire    Feeling of Stress : Not at all  Social Connections: Socially Isolated (05/13/2023)   Social Connection and Isolation Panel [NHANES]    Frequency of Communication with Friends and Family: More than three times a week    Frequency of Social Gatherings with Friends and Family: More than three times a week     Attends Religious Services: Never    Database administrator or Organizations: No    Attends Banker Meetings: Never    Marital Status: Divorced    Allergies:  Allergies  Allergen Reactions   Penicillins Rash    Childhood allergy, more than 30 years ago. No hospital required.    Metabolic Disorder Labs: Lab Results  Component Value Date   HGBA1C 5.6 04/22/2023   Lab Results  Component Value Date   PROLACTIN 10.4 09/26/2021   Lab Results  Component Value Date   CHOL 311 (H) 04/22/2023   TRIG 142 04/22/2023   HDL 57 04/22/2023   CHOLHDL 3.5 03/14/2021   VLDL 18 01/02/2017   LDLCALC 228 (H) 04/22/2023   LDLCALC 104 (H) 10/16/2022   Lab Results  Component Value Date   TSH 0.789 04/22/2023   TSH 0.968 04/17/2022    Therapeutic Level Labs: Lab Results  Component Value Date   LITHIUM  0.61 12/08/2015   LITHIUM  0.54 (L) 12/05/2015   Lab Results  Component Value Date   VALPROATE 43 (L) 06/25/2017   VALPROATE 94 04/20/2016   Lab Results  Component Value Date   CBMZ 9.4 10/16/2022    Current Medications: Current Outpatient Medications  Medication Sig Dispense Refill   aspirin  EC 81 MG tablet Take 81 mg by mouth daily.     Multiple Vitamins-Minerals (ONE DAILY CALCIUM /IRON) TABS Take by mouth.     omeprazole  (PRILOSEC) 40 MG capsule TAKE 1 CAPSULE BY MOUTH DAILY 90 capsule 3   propranolol  (INDERAL ) 10 MG tablet TAKE 1 TABLET BY MOUTH 3 TIMES DAILY 90 tablet 4   rosuvastatin  (CRESTOR ) 20 MG tablet Take 1 tablet (20 mg total) by mouth daily. 90 tablet 1   tamsulosin  (FLOMAX ) 0.4 MG CAPS capsule TAKE 1 CAPSULE BY MOUTH DAILY 90 capsule 4   ARIPiprazole  (ABILIFY ) 5 MG tablet Take 1 tablet (5 mg total) by mouth daily. 30 tablet 4   carbamazepine  (TEGRETOL ) 200 MG tablet Take 1 tablet (200 mg total) by mouth 3 (three) times daily. 90 tablet 4   gabapentin  (NEURONTIN ) 300 MG capsule 1 in the AM, 1 in the afternoon, and 2 at bedtime     No current  facility-administered medications for this visit.     Musculoskeletal: Strength & Muscle Tone: within normal limits Gait & Station:  uses a cane Patient leans: N/A  Psychiatric Specialty Exam: Review of Systems  Psychiatric/Behavioral:  Negative for agitation, behavioral problems, confusion, decreased concentration, dysphoric mood, hallucinations, self-injury, sleep disturbance and suicidal ideas. The  patient is not nervous/anxious and is not hyperactive.   All other systems reviewed and are negative.   Blood pressure (!) 173/90, pulse 82, temperature (!) 97 F (36.1 C), temperature source Skin, height 5\' 7"  (1.702 m), weight 168 lb 3.2 oz (76.3 kg).Body mass index is 26.34 kg/m.  General Appearance: Well Groomed  Eye Contact:  Good  Speech:  Clear and Coherent  Volume:  Normal  Mood:   good  Affect:  Appropriate, Congruent, and Full Range  Thought Process:  Coherent  Orientation:  Full (Time, Place, and Person)  Thought Content: Logical   Suicidal Thoughts:  No  Homicidal Thoughts:  No  Memory:  Immediate;   Good  Judgement:  Good  Insight:  Good  Psychomotor Activity:  Normal, Normal tone, no rigidity, no resting/postural tremors, no tardive dyskinesia    Concentration:  Concentration: Good and Attention Span: Good  Recall:  Good  Fund of Knowledge: Good  Language: Good  Akathisia:  No  Handed:  Right  AIMS (if indicated): 0  Assets:  Communication Skills Desire for Improvement  ADL's:  Intact  Cognition: WNL  Sleep:  Good   Screenings: AIMS    Flowsheet Row Admission (Discharged) [facility] 12/01/2015 in [facility]  AIMS Total Score 0      AUDIT    Flowsheet Row Admission (Discharged) from 12/01/2015 in [facility]  Alcohol Use Disorder Identification Test Final Score (AUDIT) 0      GAD-7    Flowsheet Row Office Visit from 10/16/2022 in [facility] Office Visit from 04/17/2022 in [facility] Office Visit from 03/21/2022 in [facility] Office Visit from 01[facility]22/2022 in [facility] Office Visit from 11/22/2021 in [facility]  Total GAD-7 Score 4 0 0 0 0      PHQ2-9    Flowsheet Row Clinical Support from 05/13/2023 in [facility] Office Visit from 10/16/2022 in [facility] Office Visit from 04/17/2022 in [facility] Office Visit from 03/21/2022 in [facility] Office Visit from 01/22/2022 in [facility]  PHQ-2 Total Score 0 0 0 0 0  PHQ-9 Total Score -- 0 0 0 0      Flowsheet Row Office Visit from 03/21/2022 in [facility] Office Visit from 01/22/2022 in [facility]  C-SSRS RISK CATEGORY No Risk Error: Q3, 4, or 5 should not be populated when Q2 is No        Assessment and Plan:  Taeveon Varland is a 78 y.o. year old male with a history of bipolar I disorder, hypertension, GERD, CKD stage III, dug induced tremor, who presents for follow up appointment for below.  1. 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 no mood symptoms since the last admission in 2017, previous admission x5 for mania, was under the care of [facility]. Originally on carbamazepine  200 mg TID, Abilfy 5 mg daily Stable.  He denies any mood symptoms since the last visit, and has been stable on the current medication regimen. He is looking forward to living value congruent life, and reports great relationship with his family.  Will continue current  dose of carbamazepine  and Abilify  to target  bipolar disorder.    2. High risk medication use Carbamazepine  is within therapeutic range.  He was advised to obtain basic labs every 6 months through his PCP (based on his preference) as outlined as below.    # Hypertension Although his blood pressure is consistently elevated at our office, he is not in acute distress, and his home BP is reportedly in normal range. He is under the care of his PCP.    Plan (he asks monthly refill only) Continue carbamazepine  200 mg three times a day (9.4 10/2022, CBC, LFT wnl 04/2023) Continue Abilify  5 mg daily (EKG QTc 374 msec, HR 68, 04/1022) - monthly refill Next appointment: 6/23 at 4 PM, IP. Call for sooner appointment if any worsening in his symptoms -on  propranolol  10 mg three times a day, gabapentin  300-300-600 mg for tremors   The patient demonstrates the following risk factors for suicide: Chronic risk factors for suicide include: psychiatric disorder of bipolar disorder . Acute risk factors for suicide include: unemployment. Protective factors for this patient include: positive social support, responsibility to others (children, family), coping skills, and hope for the future. Considering these factors, the overall suicide risk at this point appears to be low. Patient is appropriate for outpatient follow up.  Collaboration of Care: Collaboration of Care: Other reviewed notes in Epic  Patient/Guardian was advised Release of Information must be obtained prior to any record release in order to collaborate their care with an outside provider. Patient/Guardian was advised if they have not already done so to contact the registration department to sign all necessary forms in order for us  to release information regarding their care.   Consent: Patient/Guardian gives verbal consent for treatment and assignment of benefits for services provided during this visit. Patient/Guardian expressed understanding and agreed to proceed.    Todd Fossa, MD 05/19/2023,  4:24 PM

## 2023-05-19 ENCOUNTER — Encounter: Payer: Self-pay | Admitting: Psychiatry

## 2023-05-19 ENCOUNTER — Ambulatory Visit (INDEPENDENT_AMBULATORY_CARE_PROVIDER_SITE_OTHER): Payer: Medicare Other | Admitting: Psychiatry

## 2023-05-19 VITALS — BP 173/90 | HR 82 | Temp 97.0°F | Ht 67.0 in | Wt 168.2 lb

## 2023-05-19 DIAGNOSIS — F319 Bipolar disorder, unspecified: Secondary | ICD-10-CM

## 2023-05-19 MED ORDER — ARIPIPRAZOLE 5 MG PO TABS: 5.0000 mg | ORAL_TABLET | Freq: Every day | ORAL | 4 refills | Status: DC

## 2023-05-19 MED ORDER — CARBAMAZEPINE 200 MG PO TABS: 200.0000 mg | ORAL_TABLET | Freq: Three times a day (TID) | ORAL | 4 refills | Status: DC

## 2023-05-19 NOTE — Patient Instructions (Signed)
 Continue carbamazepine  200 mg three times a day  Continue Abilify  5 mg daily Next appointment: 6/23 at 4 PM

## 2023-06-05 ENCOUNTER — Other Ambulatory Visit: Payer: Self-pay | Admitting: Nurse Practitioner

## 2023-06-06 NOTE — Telephone Encounter (Signed)
 Requested Prescriptions  Pending Prescriptions Disp Refills   propranolol (INDERAL) 10 MG tablet [Pharmacy Med Name: PROPRANOLOL HCL 10 MG TAB] 270 tablet 1    Sig: TAKE 1 TABLET BY MOUTH 3 TIMES DAILY     Cardiovascular:  Beta Blockers Failed - 06/06/2023 12:19 PM      Failed - Last BP in normal range    BP Readings from Last 1 Encounters:  05/13/23 130/82         Passed - Last Heart Rate in normal range    Pulse Readings from Last 1 Encounters:  04/22/23 75         Passed - Valid encounter within last 6 months    Recent Outpatient Visits           1 month ago Bipolar disorder, current episode manic without psychotic features, moderate (HCC)   [facility] [place], [place] T, NP   7 months ago Bipolar disorder, current episode manic without psychotic features, moderate (HCC)   [facility] [place], [place] T, NP   1 year ago Bipolar disorder, current episode manic without psychotic features, moderate (HCC)   [place] [place], [place] T, NP   1 year ago Bipolar disorder, current episode manic without psychotic features, moderate (HCC)   [place] [facility] [place], Oswaldo Conroy, PA-C   1 year ago Stage 3a chronic kidney disease (HCC)   [place] [facility] [place], Dorie Rank, NP       Future Appointments             In 4 months [place], Dorie Rank, NP [facility]

## 2023-09-25 DIAGNOSIS — I10 Essential (primary) hypertension: Secondary | ICD-10-CM | POA: Diagnosis not present

## 2023-09-25 DIAGNOSIS — G251 Drug-induced tremor: Secondary | ICD-10-CM | POA: Diagnosis not present

## 2023-09-25 DIAGNOSIS — F312 Bipolar disorder, current episode manic severe with psychotic features: Secondary | ICD-10-CM | POA: Diagnosis not present

## 2023-09-25 DIAGNOSIS — Z1331 Encounter for screening for depression: Secondary | ICD-10-CM | POA: Diagnosis not present

## 2023-09-27 NOTE — Progress Notes (Unsigned)
 BH MD/PA/NP OP Progress Note  10/02/2023 4:39 PM Tony Fowler  MRN:  [id number]  Chief Complaint:  Chief Complaint  Patient presents with   Follow-up   HPI:  Per chart review, she was seen by Dr. Maree, 09/2023.  1. 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 Previously thought that motor changes were limited to drug induced in patient with history of Lithium ,Thorazine, Depakote  use now on Abilify  + some components of right hemibody parkinsonism + components of benign essential tremor - Stable/well maintained on Gabapentin  + Propranolol . Patient satisfied with current regimen   - Continue/Refill Gabapentin  300 mg by mouth four times a day (refill for one year) - Continue Propranolol  10 mg by mouth three times a day, refilled per primary care provider   This is a follow-up appointment for bipolar 1 disorder.  He states that he has been doing well.  His granddaughter is expecting a second child.  If he is happy about this.  He has good connection with his grandchildren, and great-grandchildren.  He states that he is 78 year old , and tries to do the best of it as he does not know how much time he has left.  He enjoys watching sports including hockey.  He also tried to help others in the grocery store.  He denies feeling depressed or anxiety.  He sleeps well.  He has good appetite.  He denies SI, HI, hallucinations.  He denies decreased need for sleep or euphoria.  He may drink some beer at times.  He denies substance use he feels comfortable to stay on the current medication regimen.   Household: daughter (50), her fiance Marital status: divorced in 42 (he states that his wife left due to his bipolar disorder.  She was recently found to have uterine cancer.) Number of children: 2 daughters, grandchildren (age 64-33 in 2023), great grandchildren  Employment: SSD due to bipolar disorder Education: graduate school for one year, [place] and [place]. 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 He moved to [place] in 2010 to live close to his two daughters.   Visit Diagnosis:    ICD-10-CM   1. Bipolar I disorder (HCC)  F31.9     2. High risk medication use  Z79.899       Past Psychiatric History: Please see initial evaluation for full details. I have reviewed the history. No updates at this time.     Past Medical History:  Past Medical History:  Diagnosis Date   Bipolar 1 disorder (HCC)    Coarse tremors    Hypertension     Past Surgical History:  Procedure Laterality Date   TONSILECTOMY/ADENOIDECTOMY WITH MYRINGOTOMY      Family Psychiatric History: Please see initial evaluation for full details. I have reviewed the history. No updates at this time.     Family History:  Family History  Problem Relation Age of Onset   Schizophrenia Mother    Bipolar disorder Mother    Heart disease Mother    Hypertension Mother    Mental illness Mother    Heart disease Brother    Hypertension Brother     Social History:  Social History   Socioeconomic History   Marital status: Divorced    Spouse name: Not on file   Number of children: 2   Years of education: Not  on file   Highest education level: Bachelor's degree (e.g., BA, AB, BS)  Occupational History   Occupation: disability/retired  Tobacco Use   Smoking status: Former    Current packs/day: 0.00    Average packs/day: 0.5 packs/day for 52.2 years (26.1 ttl pk-yrs)    Types: Cigarettes    Start date: 56    Quit date: 06/13/2018    Years since quitting: 5.3   Smokeless tobacco: Never  Vaping Use   Vaping status: Never Used  Substance and Sexual Activity   Alcohol use: Yes    Comment: 1 beer occasionally monthly or less   Drug use: No   Sexual activity: Not Currently  Other Topics Concern   Not on file  Social History Narrative   Not on file   Social Drivers  of Health   Financial Resource Strain: Low Risk  (05/13/2023)   Overall Financial Resource Strain (CARDIA)    Difficulty of Paying Living Expenses: Not very hard  Food Insecurity: No Food Insecurity (05/13/2023)   Hunger Vital Sign    Worried About Running Out of Food in the Last Year: Never true    Ran Out of Food in the Last Year: Never true  Transportation Needs: No Transportation Needs (05/13/2023)   PRAPARE - Administrator, Civil Service (Medical): No    Lack of Transportation (Non-Medical): No  Physical Activity: Insufficiently Active (05/13/2023)   Exercise Vital Sign    Days of Exercise per Week: 3 days    Minutes of Exercise per Session: 30 min  Stress: No Stress Concern Present (05/13/2023)   [employer] of Occupational Health - Occupational Stress Questionnaire    Feeling of Stress : Not at all  Social Connections: Socially Isolated (05/13/2023)   Social Connection and Isolation Panel    Frequency of Communication with Friends and Family: More than three times a week    Frequency of Social Gatherings with Friends and Family: More than three times a week    Attends Religious Services: Never    Database administrator or Organizations: No    Attends Banker Meetings: Never    Marital Status: Divorced    Allergies:  Allergies  Allergen Reactions   Penicillins Rash    Childhood allergy, more than 30 years ago. No hospital required.    Metabolic Disorder Labs: Lab Results  Component Value Date   HGBA1C 5.6 04/22/2023   Lab Results  Component Value Date   PROLACTIN 10.4 09/26/2021   Lab Results  Component Value Date   CHOL 311 (H) 04/22/2023   TRIG 142 04/22/2023   HDL 57 04/22/2023   CHOLHDL 3.5 03/14/2021   VLDL 18 01/02/2017   LDLCALC 228 (H) 04/22/2023   LDLCALC 104 (H) 10/16/2022   Lab Results  Component Value Date   TSH 0.789 04/22/2023   TSH 0.968 04/17/2022    Therapeutic Level Labs: Lab Results  Component Value Date    LITHIUM  0.61 12/08/2015   LITHIUM  0.54 (L) 12/05/2015   Lab Results  Component Value Date   VALPROATE 43 (L) 06/25/2017   VALPROATE 94 04/20/2016   Lab Results  Component Value Date   CBMZ 9.4 10/16/2022    Current Medications: Current Outpatient Medications  Medication Sig Dispense Refill   [START ON 10/16/2023] ARIPiprazole  (ABILIFY ) 5 MG tablet Take 1 tablet (5 mg total) by mouth daily. 30 tablet 3   aspirin  EC 81 MG tablet Take 81 mg by mouth daily.     [  START ON 10/16/2023] carbamazepine  (TEGRETOL ) 200 MG tablet Take 1 tablet (200 mg total) by mouth 3 (three) times daily. 90 tablet 3   gabapentin  (NEURONTIN ) 300 MG capsule 1 in the AM, 1 in the afternoon, and 2 at bedtime     Multiple Vitamins-Minerals (ONE DAILY CALCIUM /IRON) TABS Take by mouth.     omeprazole  (PRILOSEC) 40 MG capsule TAKE 1 CAPSULE BY MOUTH DAILY 90 capsule 3   propranolol  (INDERAL ) 10 MG tablet TAKE 1 TABLET BY MOUTH 3 TIMES DAILY 270 tablet 1   rosuvastatin  (CRESTOR ) 20 MG tablet Take 1 tablet (20 mg total) by mouth daily. 90 tablet 1   tamsulosin  (FLOMAX ) 0.4 MG CAPS capsule TAKE 1 CAPSULE BY MOUTH DAILY 90 capsule 4   No current facility-administered medications for this visit.     Musculoskeletal: Strength & Muscle Tone: within normal limits Gait & Station: normal Patient leans: N/A  Psychiatric Specialty Exam: Review of Systems  Psychiatric/Behavioral: Negative.    All other systems reviewed and are negative.   Blood pressure (!) 148/91, pulse 73, temperature 98.5 F (36.9 C), temperature source Temporal, height 5' 7 (1.702 m), weight 165 lb 6.4 oz (75 kg).Body mass index is 25.91 kg/m.  General Appearance: Well Groomed  Eye Contact:  Good  Speech:  Clear and Coherent  Volume:  Normal  Mood:  good  Affect:  Appropriate, Congruent, and Full Range  Thought Process:  Coherent  Orientation:  Full (Time, Place, and Person)  Thought Content: Logical   Suicidal Thoughts:  No  Homicidal  Thoughts:  No  Memory:  Immediate;   Good  Judgement:  Good  Insight:  Good  Psychomotor Activity:  Normal,Normal tone, no rigidity, no resting/postural tremors, no tardive dyskinesia    Concentration:  Concentration: Good and Attention Span: Good  Recall:  Good  Fund of Knowledge: Good  Language: Good  Akathisia:  No  Handed:  Right  AIMS (if indicated):0   Assets:  Communication Skills Desire for Improvement  ADL's:  Intact  Cognition: WNL  Sleep:  Good   Screenings: AIMS    Flowsheet Row Admission (Discharged) from 12/01/2015 in [facility]  AIMS Total Score 0   AUDIT    Flowsheet Row Admission (Discharged) from 12/01/2015 in [facility]INE  Alcohol Use Disorder Identification Test Final Score (AUDIT) 0   GAD-7    Flowsheet Row Office Visit from 10/16/2022 in [facility] Office Visit from 04/17/2022 in [facility] Office Visit from 03/21/2022 in [facility] Office Visit from 01/22/2022 in [facility] Office Visit from 11/22/2021 in [facility]  Total GAD-7 Score 4 0 0 0 0   PHQ2-9    Flowsheet Row Clinical Support from 05/13/2023 in [facility] Office Visit from 10/16/2022 in [facility] Office Visit from 04/17/2022 in [facility] Office Visit from 03/21/2022 in [facility] Office Visit from 01/22/2022 in [facility]  PHQ-2 Total Score 0 0 0 0 0  PHQ-9 Total Score -- 0 0 0 0   Flowsheet Row Office Visit from 03/21/2022 in [facility] Office Visit from 01/22/2022 in [facility]  C-SSRS RISK CATEGORY No Risk Error: Q3, 4, or 5 should not be populated  when Q2 is No  Assessment and Plan:  Tony Fowler is a 78 y.o. year old male with a history of bipolar I disorder, hypertension, GERD, CKD stage III, dug induced tremor, who presents for follow up appointment for below.  1. 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 no mood symptoms since the last admission in 2017, previous admission x5 for mania, was under the care of [facility]. Originally on carbamazepine  200 mg TID, Abilfy 5 mg daily Stable.  He denies any mood symptoms since the last visit.  He is looking forward to living very incongruent with his life, and maintains good connection with his family.  Will continue current dose of carbamazepine  and Abilify  to target bipolar as there.   2. High risk medication use Will obtain blood test for monitoring. (He prefers to get this done through his PCP)     Last checked  EKG HR 68, QTc345msec 04/2022  Lipid panels Chol 311 H, LDL 228 H 04/2023  HbA1c Glu 107 H 04/2023     # Hypertension No change. Although his blood pressure is consistently elevated at our office, he is not in acute distress, and his home BP is reportedly in normal range. He is under the care of his PCP.    Plan (he asks monthly refill only) Continue carbamazepine  200 mg three times a day (9.4 10/2022, CBC, LFT wnl 04/2023) Continue Abilify  5 mg daily  Obtain lab (carbamazepine , CBC, LFT) Next appointment: 10/23 at 4:30,  Call for sooner appointment if any worsening in his symptoms -on  propranolol  10 mg three times a day, gabapentin  300-300-600 mg for tremors   The patient demonstrates the following risk factors for suicide: Chronic risk factors for suicide include: psychiatric disorder of bipolar disorder . Acute risk factors for suicide include: unemployment. Protective factors for this patient include:  positive social support, responsibility to others (children, family), coping skills, and hope for the future. Considering these factors, the overall suicide risk at this point appears to be low. Patient is appropriate for outpatient follow up.  Collaboration of Care: Collaboration of Care: Other reviewed notes in Epic  Patient/Guardian was advised Release of Information must be obtained prior to any record release in order to collaborate their care with an outside provider. Patient/Guardian was advised if they have not already done so to contact the registration department to sign all necessary forms in order for us  to release information regarding their care.   A total of 30 minutes was spent on the following activities during the encounter date, which includes but is not limited to: preparing to see the patient (e.g., reviewing tests and records), obtaining and/or reviewing separately obtained history, performing a medically necessary examination or evaluation, counseling and educating the patient, family, or caregiver, ordering medications, tests, or procedures, referring and communicating with other healthcare professionals (when not reported separately), documenting clinical information in the electronic or paper health record, independently interpreting test or lab results and communicating these results to the family or caregiver, and coordinating care (when not reported separately).   Consent: Patient/Guardian gives verbal consent for treatment and assignment of benefits for services provided during this visit. Patient/Guardian expressed understanding and agreed to proceed.    Katheren Sleet, MD 10/02/2023, 4:39 PM

## 2023-09-29 ENCOUNTER — Ambulatory Visit: Payer: Medicare Other | Admitting: Psychiatry

## 2023-10-02 ENCOUNTER — Other Ambulatory Visit: Payer: Self-pay

## 2023-10-02 ENCOUNTER — Encounter: Payer: Self-pay | Admitting: Psychiatry

## 2023-10-02 ENCOUNTER — Ambulatory Visit (INDEPENDENT_AMBULATORY_CARE_PROVIDER_SITE_OTHER): Admitting: Psychiatry

## 2023-10-02 VITALS — BP 148/91 | HR 73 | Temp 98.5°F | Ht 67.0 in | Wt 165.4 lb

## 2023-10-02 DIAGNOSIS — F319 Bipolar disorder, unspecified: Secondary | ICD-10-CM | POA: Diagnosis not present

## 2023-10-02 DIAGNOSIS — Z79899 Other long term (current) drug therapy: Secondary | ICD-10-CM

## 2023-10-02 MED ORDER — CARBAMAZEPINE 200 MG PO TABS: 200.0000 mg | ORAL_TABLET | Freq: Three times a day (TID) | ORAL | 3 refills | Status: DC

## 2023-10-02 MED ORDER — ARIPIPRAZOLE 5 MG PO TABS: 5.0000 mg | ORAL_TABLET | Freq: Every day | ORAL | 3 refills | Status: DC

## 2023-10-02 NOTE — Patient Instructions (Signed)
 Continue carbamazepine  200 mg three times a day Continue Abilify  5 mg daily  Obtain lab (carbamazepine , CBC, LFT) Next appointment: 10/23 at 4:30

## 2023-10-06 ENCOUNTER — Other Ambulatory Visit: Payer: Self-pay | Admitting: Nurse Practitioner

## 2023-10-07 ENCOUNTER — Other Ambulatory Visit: Payer: Self-pay | Admitting: Nurse Practitioner

## 2023-10-07 NOTE — Telephone Encounter (Unsigned)
 Copied from CRM [id number]. Topic: Clinical - Medication Refill >> Oct 07, 2023  3:54 PM Shardie S wrote: Medication: rosuvastatin  (CRESTOR ) 20 MG tablet  Has the patient contacted their pharmacy? Yes (Agent: If no, request that the patient contact the pharmacy for the refill. If patient does not wish to contact the pharmacy document the reason why and proceed with request.) (Agent: If yes, when and what did the pharmacy advise?)  This is the patient's preferred pharmacy:  [facility] - [place], [place] - [address] [place] [address] Phone: [phone] Fax: [phone]  Is this the correct pharmacy for this prescription? Yes If no, delete pharmacy and type the correct one.   Has the prescription been filled recently? No  Is the patient out of the medication? Yes  Has the patient been seen for an appointment in the last year OR does the patient have an upcoming appointment? Yes  Can we respond through MyChart? Yes  Agent: Please be advised that Rx refills may take up to 3 business days. We ask that you follow-up with your pharmacy.

## 2023-10-08 NOTE — Telephone Encounter (Signed)
 Requested Prescriptions  Pending Prescriptions Disp Refills   rosuvastatin  (CRESTOR ) 20 MG tablet [Pharmacy Med Name: ROSUVASTATIN  CALCIUM  20 MG TAB] 90 tablet 1    Sig: TAKE 1 TABLET BY MOUTH DAILY     Cardiovascular:  Antilipid - Statins 2 Failed - 10/08/2023  8:38 AM      Failed - Cr in normal range and within 360 days    Creatinine, Ser  Date Value Ref Range Status  04/22/2023 1.48 (H) 0.76 - 1.27 mg/dL Final         Failed - Lipid Panel in normal range within the last 12 months    Cholesterol, Total  Date Value Ref Range Status  04/22/2023 311 (H) 100 - 199 mg/dL Final   Cholesterol Piccolo, Waived  Date Value Ref Range Status  01/02/2017 210 (H) <200 mg/dL Final    Comment:                            Desirable                <200                         Borderline High      200- 239                         High                     >239    LDL Chol Calc (NIH)  Date Value Ref Range Status  04/22/2023 228 (H) 0 - 99 mg/dL Final   HDL  Date Value Ref Range Status  04/22/2023 57 >39 mg/dL Final   Triglycerides  Date Value Ref Range Status  04/22/2023 142 0 - 149 mg/dL Final   Triglycerides Piccolo,Waived  Date Value Ref Range Status  01/02/2017 88 <150 mg/dL Final    Comment:                            Normal                   <150                         Borderline High     150 - 199                         High                200 - 499                         Very High                >499          Passed - Patient is not pregnant      Passed - Valid encounter within last 12 months    Recent Outpatient Visits   None     Future Appointments             In 1 week Cannady, Melanie DASEN, NP [facility], [facility]

## 2023-10-09 NOTE — Telephone Encounter (Signed)
 Requested Prescriptions  Refused Prescriptions Disp Refills   rosuvastatin  (CRESTOR ) 20 MG tablet 90 tablet 1    Sig: Take 1 tablet (20 mg total) by mouth daily.     Cardiovascular:  Antilipid - Statins 2 Failed - 10/09/2023  2:10 PM      Failed - Cr in normal range and within 360 days    Creatinine, Ser  Date Value Ref Range Status  04/22/2023 1.48 (H) 0.76 - 1.27 mg/dL Final         Failed - Lipid Panel in normal range within the last 12 months    Cholesterol, Total  Date Value Ref Range Status  04/22/2023 311 (H) 100 - 199 mg/dL Final   Cholesterol Piccolo, Waived  Date Value Ref Range Status  01/02/2017 210 (H) <200 mg/dL Final    Comment:                            Desirable                <200                         Borderline High      200- 239                         High                     >239    LDL Chol Calc (NIH)  Date Value Ref Range Status  04/22/2023 228 (H) 0 - 99 mg/dL Final   HDL  Date Value Ref Range Status  04/22/2023 57 >39 mg/dL Final   Triglycerides  Date Value Ref Range Status  04/22/2023 142 0 - 149 mg/dL Final   Triglycerides Piccolo,Waived  Date Value Ref Range Status  01/02/2017 88 <150 mg/dL Final    Comment:                            Normal                   <150                         Borderline High     150 - 199                         High                200 - 499                         Very High                >499          Passed - Patient is not pregnant      Passed - Valid encounter within last 12 months    Recent Outpatient Visits   None     Future Appointments             In 1 week Cannady, Jolene T, NP [facility], [facility]

## 2023-10-19 NOTE — Patient Instructions (Signed)
 Be Involved in Caring For Your Health:  Taking Medications When medications are taken as directed, they can greatly improve your health. But if they are not taken as prescribed, they may not work. In some cases, not taking them correctly can be harmful. To help ensure your treatment remains effective and safe, understand your medications and how to take them. Bring your medications to each visit for review by your provider.  Your lab results, notes, and after visit summary will be available on My Chart. We strongly encourage you to use this feature. If lab results are abnormal the clinic will contact you with the appropriate steps. If the clinic does not contact you assume the results are satisfactory. You can always view your results on My Chart. If you have questions regarding your health or results, please contact the clinic during office hours. You can also ask questions on My Chart.  We at [facility] are grateful that you chose [place] to provide your care. We strive to provide evidence-based and compassionate care and are always looking for feedback. If you get a survey from the clinic please complete this so we can hear your opinions.  Chronic Kidney Disease: Eating Plan Chronic kidney disease (CKD) is when your kidneys aren't working well. They can't remove waste, fluids, and other substances from your blood. When these substances build up, they can worsen kidney damage and affect your health. Eating certain foods can lead to a buildup of these substances. Changing your diet can help prevent more kidney damage. Diet changes may also delay dialysis or even keep you from needing it. What nutrients should I limit? Work with your health care team and an expert in healthy eating (dietitian) to make a meal plan that's right for you. Foods you can eat and foods you should limit or avoid will depend on: The stage of your kidney disease. Any other conditions you have. The items listed below  are not a complete list. Talk with a dietitian to learn what's best for you. Potassium Potassium affects how well your heart beats. Too much potassium in your blood can cause an irregular heartbeat or even a heart attack. You may need to limit foods that are high in potassium, such as: Liquid milk and soy milk. Salt substitutes that contain potassium. Fruits like: Bananas. Apricots. Melon. Prunes and raisins. Kiwi. Nectarines and oranges. Vegetables, such as: Potatoes, sweet potatoes, and yams. Tomatoes. Leafy greens. Beets. Avocado. Pumpkin and winter squash. Beans, like lima beans. Nuts. Phosphorus Phosphorus is a mineral found in your bones. You need a balance between calcium and phosphorus to build and maintain healthy bones.  Too much added phosphorus from the foods you eat can pull calcium from your bones. Losing calcium can make your bones weak and more likely to break. Too much phosphorus can also make your skin itch. You may need to limit foods that are high in phosphorus or that have added phosphorus, such as: Liquid milk and dairy products. Dark-colored sodas or soft drinks. Bran cereals and oatmeal. Protein  Protein helps your body make and keep muscle. Protein also helps to repair your body's cells and tissues.  One of the natural breakdown products of protein is a waste product called urea. When your kidneys aren't working well, they can't remove waste like urea. Reducing protein in your diet can help keep urea from building up in your blood. Depending on your stage of kidney disease, you may need to eat smaller portions of foods that  are high in protein. Sources of animal protein include: Meat (all types). Fish and seafood. Poultry. Eggs. Dairy. Other protein foods include: Beans and legumes. Nuts and nut butter. Soy, like tofu.  Sodium Salt (sodium) helps to keep a healthy balance of fluids in your body. Too much salt can increase your blood pressure,  which can harm your heart and lungs. Extra salt can also cause your body to keep too much fluid, making your kidneys work harder. You may need to limit or avoid foods that are high in salt, such as: Salt seasonings. Soy and teriyaki sauce. Meats that are: Packaged. Precooked. Cured. Processed. Salted crackers and snack foods. Fast food. Canned soups and foods. Pickled foods. Boxed mixes or ready-to-eat boxed meals and side dishes. Bottled dressings, sauces, and marinades. Talk with your dietitian about how much potassium, phosphorus, protein, and salt you may have each day. What are tips for following this plan? Reading food labels  Check the amount of salt in foods. Limit foods that have salt listed among the first five ingredients. Try to eat low-salt foods. Check the ingredient list for added phosphorus or potassium. "Phos" in an ingredient is a sign that phosphorus has been added. Do not buy foods that are calcium-enriched or that have calcium added to them (fortified). Buy canned vegetables and beans that say "no salt added." Rinse them before eating. Lifestyle Limit the amount of protein you eat from animal sources each day. Focus on protein from plant sources, like tofu and dried beans, peas, and lentils. Do not add salt to food when cooking or before eating. Do not eat star fruit. It can be toxic for people with kidney problems. Talk with your health care provider before taking any vitamin or mineral supplements. If told by your provider: Track how much liquid you have so you can avoid drinking too much. Try to eat foods that are made mostly from water, like gelatin, ice cream, soups, and juicy fruits and vegetables. If you have diabetes and chronic kidney disease: If you have diabetes and CKD, you need to keep your blood sugar (glucose) in the target range recommended by your provider. Follow your diabetes management plan. This may include: Checking your blood glucose  regularly. Taking medicines by mouth, or taking insulin, or both. Exercising for at least 30 minutes on 5 or more days each week, or as told by your provider. Tracking how many servings of carbohydrates you eat at each meal. Not using orange juice to treat low blood sugars. Instead, use apple juice, cranberry juice, or clear soda. You may be given guidelines on what foods and nutrients you may eat, and how much you can have each day. This depends on your stage of kidney disease and whether you have high blood pressure. Follow the meal plan your dietitian gives you. Where to find more information [employer] of Diabetes and Digestive and Kidney Diseases: [employer]Sync.si National Kidney Foundation: kidney.org This information is not intended to replace advice given to you by your health care provider. Make sure you discuss any questions you have with your health care provider. Document Revised: 11/05/2022 Document Reviewed: 11/05/2022 Elsevier Patient Education  2024 [employer].

## 2023-10-21 ENCOUNTER — Encounter: Payer: Self-pay | Admitting: Nurse Practitioner

## 2023-10-21 ENCOUNTER — Ambulatory Visit (INDEPENDENT_AMBULATORY_CARE_PROVIDER_SITE_OTHER): Payer: Self-pay | Admitting: Nurse Practitioner

## 2023-10-21 VITALS — BP 128/80 | HR 73 | Temp 98.2°F | Wt 166.8 lb

## 2023-10-21 DIAGNOSIS — G251 Drug-induced tremor: Secondary | ICD-10-CM | POA: Diagnosis not present

## 2023-10-21 DIAGNOSIS — I10 Essential (primary) hypertension: Secondary | ICD-10-CM | POA: Diagnosis not present

## 2023-10-21 DIAGNOSIS — F3112 Bipolar disorder, current episode manic without psychotic features, moderate: Secondary | ICD-10-CM | POA: Diagnosis not present

## 2023-10-21 DIAGNOSIS — N401 Enlarged prostate with lower urinary tract symptoms: Secondary | ICD-10-CM

## 2023-10-21 DIAGNOSIS — R7309 Other abnormal glucose: Secondary | ICD-10-CM | POA: Diagnosis not present

## 2023-10-21 DIAGNOSIS — N138 Other obstructive and reflux uropathy: Secondary | ICD-10-CM

## 2023-10-21 DIAGNOSIS — N1831 Chronic kidney disease, stage 3a: Secondary | ICD-10-CM | POA: Diagnosis not present

## 2023-10-21 DIAGNOSIS — K219 Gastro-esophageal reflux disease without esophagitis: Secondary | ICD-10-CM | POA: Diagnosis not present

## 2023-10-21 DIAGNOSIS — E782 Mixed hyperlipidemia: Secondary | ICD-10-CM | POA: Diagnosis not present

## 2023-10-21 LAB — BAYER DCA HB A1C WAIVED: HB A1C (BAYER DCA - WAIVED): 5.7 % — ABNORMAL HIGH (ref 4.8–5.6)

## 2023-10-21 MED ORDER — TAMSULOSIN HCL 0.4 MG PO CAPS: 0.4000 mg | ORAL_CAPSULE | Freq: Every day | ORAL | 4 refills | Status: AC

## 2023-10-21 MED ORDER — PROPRANOLOL HCL 10 MG PO TABS: 10.0000 mg | ORAL_TABLET | Freq: Three times a day (TID) | ORAL | 3 refills | Status: AC

## 2023-10-21 MED ORDER — ROSUVASTATIN CALCIUM 20 MG PO TABS: 20.0000 mg | ORAL_TABLET | Freq: Every day | ORAL | 3 refills | Status: DC

## 2023-10-21 MED ORDER — OMEPRAZOLE 40 MG PO CPDR: 40.0000 mg | DELAYED_RELEASE_CAPSULE | Freq: Every day | ORAL | 3 refills | Status: AC

## 2023-10-21 NOTE — Assessment & Plan Note (Signed)
 Chronic, ongoing.  Initial BP elevated, but repeat improved and well below goal. Known white coat due to PTSD from past involuntary commitments and provider settings are stressful for him.  Home BP's average at goal for age, he checks regularly.  Have recommended adding on a low dose of Losartan with his CKD, would avoid ACE as suspect some underlying COPD.  He refuses to start new medication.  Will continue Propranolol  for BP and tremor.  Recommend he monitor BP at home daily and document for provider visits + focus on DASH diet.  LABS: CMP.  Refills up to date.  Return in 6 months.

## 2023-10-21 NOTE — Assessment & Plan Note (Signed)
 History of elevation in A1c -- will recheck today and monitor, 5.7% today.  Has been stable for years in 5 range.

## 2023-10-21 NOTE — Assessment & Plan Note (Signed)
 Chronic, stable.  Denies SI/HI.  Continue collaboration with psychiatry and regimen as prescribed by them.   Recent note reviewed, will obtain labs requested by psychiatry and alert them to results.

## 2023-10-21 NOTE — Assessment & Plan Note (Signed)
 Chronic, ongoing.  Remaining stable on recent labs.  Recommend Losartan or alternate ARB addition for kidney protection.  He refuses to add or change medications.  Recheck CMP.

## 2023-10-21 NOTE — Progress Notes (Signed)
 BP 128/80 (BP Location: Left Arm, Patient Position: Sitting, Cuff Size: Normal) Comment: manual with relaxing  Pulse 73   Temp 98.2 F (36.8 C) (Oral)   Wt 166 lb 12.8 oz (75.7 kg)   SpO2 96%   BMI 26.12 kg/m    Subjective:    Patient ID: Elza Samples, male    DOB: 04/23/1945, 78 y.o.   MRN: [id number]  HPI: Trystyn Dolley is a 78 y.o. male  Chief Complaint  Patient presents with   Chronic Kidney Disease   Depression   Hyperlipidemia   Hypertension   HYPERTENSION / HYPERLIPIDEMIA Continues Propranolol  10 MG TID (tremor and BP), Crestor  40 MG daily, ASA.  Has visits with neurology for drug-induced tremor, last seen 09/25/23. Continues Gabapentin  300 MG QID and Propranolol  10 MG TID.  Quit smoking on March 7th, 2020. He had smoked since 78 years old, 1 PPD per day.  Declines lung CT cancer screening.  Does get white coat with providers due to history of involuntary admissions to psychiatry in past. Satisfied with current treatment? yes Duration of hypertension: chronic BP monitoring frequency: daily BP range: 130/80 range at home on average -- uses Omron BP medication side effects: no Duration of hyperlipidemia: chronic Cholesterol medication side effects: no Cholesterol supplements: none Medication compliance: good compliance Aspirin : yes Recent stressors: no Recurrent headaches: no Visual changes: no Palpitations: no Dyspnea: no Chest pain: no Lower extremity edema: no Dizzy/lightheaded: no The 10-year ASCVD risk score (Arnett DK, et al., 2019) is: 32.5%   Values used to calculate the score:     Age: 43 years     Clincally relevant sex: Male     Is Non-Hispanic African American: No     Diabetic: No     Tobacco smoker: No     Systolic Blood Pressure: 128 mmHg     Is BP treated: Yes     HDL Cholesterol: 57 mg/dL     Total Cholesterol: 311 mg/dL  CHRONIC KIDNEY DISEASE (CKD 3a) Continues Tamsulosin  for BPH, refuses to start ARB for kidney protection. Prefers  not to add medications. Omeprazole  refills needed for GERD. CKD status: stable Medications renally dose: yes Previous renal evaluation: no Pneumovax:  Up to Date Influenza Vaccine:  Up To Date  Impaired Fasting Glucose HbA1C:  Lab Results  Component Value Date   HGBA1C 5.6 04/22/2023  Duration of elevated blood sugar: years Polydipsia: no Polyuria: no Weight change: no Visual disturbance: no Glucose Monitoring: no    Accucheck frequency: Not Checking    Fasting glucose:     Post prandial:  Diabetic Education: Not Completed Family history of diabetes: no   BIPOLAR AFFECTIVE DISORDER: Last saw psychiatry 10/02/23, Dr. Vickey. Continues on Abilify  5 MG daily and Tegretol  200 MG TID.  Needs labs for psychiatry. Mood status: stable Satisfied with current treatment?: yes Symptom severity: mild  Duration of current treatment : chronic Side effects: no Medication compliance: good compliance Psychotherapy/counseling: current Depressed mood: no Anxious mood: no Anhedonia: no Significant weight loss or gain: no Insomnia: no Fatigue: no Feelings of worthlessness or guilt: no Impaired concentration/indecisiveness: no Suicidal ideations: no Hopelessness: no Crying spells: no    10/21/2023   10:01 AM 05/13/2023    9:21 AM 10/16/2022    9:49 AM 04/17/2022    8:30 AM 03/21/2022    3:11 PM  Depression screen PHQ 2/9  Decreased Interest 0 0 0 0   Down, Depressed, Hopeless 0 0 0 0   PHQ - 2  Score 0 0 0 0   Altered sleeping 0  0 0   Tired, decreased energy 0  0 0   Change in appetite 0  0 0   Feeling bad or failure about yourself  0  0 0   Trouble concentrating 0  0 0   Moving slowly or fidgety/restless 0  0 0   Suicidal thoughts 0  0 0   PHQ-9 Score 0  0 0   Difficult doing work/chores Not difficult at all  Not difficult at all Not difficult at all      Information is confidential and restricted. Go to Review Flowsheets to unlock data.       10/21/2023   10:01 AM 10/16/2022     9:49 AM 04/17/2022    8:31 AM 03/21/2022    3:12 PM  GAD 7 : Generalized Anxiety Score  Nervous, Anxious, on Edge 0 1 0   Control/stop worrying 0 1 0   Worry too much - different things 0 1 0   Trouble relaxing 0 0 0   Restless 0 0 0   Easily annoyed or irritable 0 1 0   Afraid - awful might happen 0 0 0   Total GAD 7 Score 0 4 0   Anxiety Difficulty Not difficult at all Not difficult at all Not difficult at all      Information is confidential and restricted. Go to Review Flowsheets to unlock data.    Relevant past medical, surgical, family and social history reviewed and updated as indicated. Interim medical history since our last visit reviewed. Allergies and medications reviewed and updated.  Review of Systems  Constitutional:  Negative for activity change, diaphoresis, fatigue and fever.  Respiratory:  Negative for cough, chest tightness, shortness of breath and wheezing.   Cardiovascular:  Negative for chest pain, palpitations and leg swelling.  Gastrointestinal: Negative.   Endocrine: Negative.   Neurological: Negative.   Psychiatric/Behavioral: Negative.     Per HPI unless specifically indicated above     Objective:    BP 128/80 (BP Location: Left Arm, Patient Position: Sitting, Cuff Size: Normal) Comment: manual with relaxing  Pulse 73   Temp 98.2 F (36.8 C) (Oral)   Wt 166 lb 12.8 oz (75.7 kg)   SpO2 96%   BMI 26.12 kg/m   Wt Readings from Last 3 Encounters:  10/21/23 166 lb 12.8 oz (75.7 kg)  05/13/23 166 lb 12.8 oz (75.7 kg)  04/22/23 166 lb 6.4 oz (75.5 kg)    Physical Exam Vitals and nursing note reviewed.  Constitutional:      General: He is awake. He is not in acute distress.    Appearance: He is well-developed, well-groomed and overweight. He is not ill-appearing.  HENT:     Head: Normocephalic and atraumatic.     Right Ear: Hearing normal. No drainage.     Left Ear: Hearing normal. No drainage.  Eyes:     General: Lids are normal.         Right eye: No discharge.        Left eye: No discharge.     Conjunctiva/sclera: Conjunctivae normal.     Pupils: Pupils are equal, round, and reactive to light.  Neck:     Thyroid : No thyromegaly.     Vascular: No carotid bruit.     Trachea: Trachea normal.  Cardiovascular:     Rate and Rhythm: Normal rate and regular rhythm.     Heart sounds: Normal heart  sounds, S1 normal and S2 normal. No murmur heard.    No gallop.  Pulmonary:     Effort: Pulmonary effort is normal. No accessory muscle usage or respiratory distress.     Breath sounds: Normal breath sounds.  Abdominal:     General: Bowel sounds are normal.     Palpations: Abdomen is soft.  Musculoskeletal:        General: Normal range of motion.     Cervical back: Normal range of motion and neck supple.     Right lower leg: No edema.     Left lower leg: No edema.  Skin:    General: Skin is warm and dry.     Capillary Refill: Capillary refill takes less than 2 seconds.     Findings: No rash.  Neurological:     Mental Status: He is alert and oriented to person, place, and time.     Deep Tendon Reflexes: Reflexes are normal and symmetric.  Psychiatric:        Attention and Perception: Attention normal.        Mood and Affect: Mood normal.        Speech: Speech normal.        Behavior: Behavior normal. Behavior is cooperative.        Thought Content: Thought content normal.    Results for orders placed or performed in visit on 04/22/23  Microalbumin, Urine Waived   Collection Time: 04/22/23 10:10 AM  Result Value Ref Range   Microalb, Ur Waived 30 (H) 0 - 19 mg/L   Creatinine, Urine Waived 10 10 - 300 mg/dL   Microalb/Creat Ratio >300 (H) <30 mg/g  Bayer DCA Hb A1c Waived   Collection Time: 04/22/23 10:10 AM  Result Value Ref Range   HB A1C (BAYER DCA - WAIVED) 5.6 4.8 - 5.6 %  CBC with Differential/Platelet   Collection Time: 04/22/23 10:11 AM  Result Value Ref Range   WBC 6.4 3.4 - 10.8 x10E3/uL   RBC 4.51 4.14 -  5.80 x10E6/uL   Hemoglobin 14.4 13.0 - 17.7 g/dL   Hematocrit 56.7 62.4 - 51.0 %   MCV 96 79 - 97 fL   MCH 31.9 26.6 - 33.0 pg   MCHC 33.3 31.5 - 35.7 g/dL   RDW 87.1 88.3 - 84.5 %   Platelets 271 150 - 450 x10E3/uL   Neutrophils 70 Not Estab. %   Lymphs 20 Not Estab. %   Monocytes 7 Not Estab. %   Eos 2 Not Estab. %   Basos 1 Not Estab. %   Neutrophils Absolute 4.4 1.4 - 7.0 x10E3/uL   Lymphocytes Absolute 1.3 0.7 - 3.1 x10E3/uL   Monocytes Absolute 0.5 0.1 - 0.9 x10E3/uL   EOS (ABSOLUTE) 0.1 0.0 - 0.4 x10E3/uL   Basophils Absolute 0.1 0.0 - 0.2 x10E3/uL   Immature Granulocytes 0 Not Estab. %   Immature Grans (Abs) 0.0 0.0 - 0.1 x10E3/uL  Comprehensive metabolic panel   Collection Time: 04/22/23 10:11 AM  Result Value Ref Range   Glucose 107 (H) 70 - 99 mg/dL   BUN 24 8 - 27 mg/dL   Creatinine, Ser 8.51 (H) 0.76 - 1.27 mg/dL   eGFR 49 (L) >40 fO/fpw/8.26   BUN/Creatinine Ratio 16 10 - 24   Sodium 139 134 - 144 mmol/L   Potassium 4.8 3.5 - 5.2 mmol/L   Chloride 104 96 - 106 mmol/L   CO2 20 20 - 29 mmol/L   Calcium  9.7 8.6 - 10.2  mg/dL   Total Protein 6.7 6.0 - 8.5 g/dL   Albumin 4.1 3.8 - 4.8 g/dL   Globulin, Total 2.6 1.5 - 4.5 g/dL   Bilirubin Total 0.4 0.0 - 1.2 mg/dL   Alkaline Phosphatase 117 44 - 121 IU/L   AST 30 0 - 40 IU/L   ALT 23 0 - 44 IU/L  Lipid Panel w/o Chol/HDL Ratio   Collection Time: 04/22/23 10:11 AM  Result Value Ref Range   Cholesterol, Total 311 (H) 100 - 199 mg/dL   Triglycerides 857 0 - 149 mg/dL   HDL 57 >60 mg/dL   VLDL Cholesterol Cal 26 5 - 40 mg/dL   LDL Chol Calc (NIH) 771 (H) 0 - 99 mg/dL   LDL CALC COMMENT: Comment   TSH   Collection Time: 04/22/23 10:11 AM  Result Value Ref Range   TSH 0.789 0.450 - 4.500 uIU/mL  Magnesium    Collection Time: 04/22/23 10:11 AM  Result Value Ref Range   Magnesium  2.0 1.6 - 2.3 mg/dL      Assessment & Plan:   Problem List Items Addressed This Visit       Cardiovascular and Mediastinum    Benign hypertension (Chronic)   Chronic, ongoing.  Initial BP elevated, but repeat improved and well below goal. Known white coat due to PTSD from past involuntary commitments and provider settings are stressful for him.  Home BP's average at goal for age, he checks regularly.  Have recommended adding on a low dose of Losartan with his CKD, would avoid ACE as suspect some underlying COPD.  He refuses to start new medication.  Will continue Propranolol  for BP and tremor.  Recommend he monitor BP at home daily and document for provider visits + focus on DASH diet.  LABS: CMP.  Refills up to date.  Return in 6 months.      Relevant Medications   propranolol  (INDERAL ) 10 MG tablet   rosuvastatin  (CRESTOR ) 20 MG tablet   Other Relevant Orders   Comprehensive metabolic panel with GFR   CBC with Differential/Platelet     Digestive   GERD (gastroesophageal reflux disease)   Chronic, stable with Protonix .  Continue current regimen and consider trial reduction in future, if tolerated would consider discontinuation.  Risks of PPI use were discussed with patient including bone loss, C. Diff diarrhea, pneumonia, infections, CKD, electrolyte abnormalities.  Verbalizes understanding and chooses to continue the medication. Mag level today, obtain next visit.      Relevant Medications   omeprazole  (PRILOSEC) 40 MG capsule     Nervous and Auditory   Drug-induced tremor   Chronic, stable.  Will continue Propranolol , refills up to date, and continue collaboration with neurology as needed.  Recent note reviewed.        Genitourinary   BPH with obstruction/lower urinary tract symptoms (Chronic)   Chronic, stable with no symptoms at this time.  Last PSA 3.9 in December 2023 -- normal for age and ethnicity.  Continue current medication regimen and adjust as needed.  Refills sent in.      Relevant Medications   tamsulosin  (FLOMAX ) 0.4 MG CAPS capsule   CKD (chronic kidney disease) stage 3, GFR 30-59 ml/min  (HCC) - Primary   Chronic, ongoing.  Remaining stable on recent labs.  Recommend Losartan or alternate ARB addition for kidney protection.  He refuses to add or change medications.  Recheck CMP.      Relevant Orders   Comprehensive metabolic panel with GFR  CBC with Differential/Platelet     Other   Mixed hyperlipidemia (Chronic)   Chronic, ongoing.  Continue current medication regimen, recommend he restart, and adjust as needed.  CMP and lipid panel today.  Monitor closely as Tegretol  can reduce effect of Rosuvastatin . Return in 6 months.      Relevant Medications   propranolol  (INDERAL ) 10 MG tablet   rosuvastatin  (CRESTOR ) 20 MG tablet   Other Relevant Orders   Comprehensive metabolic panel with GFR   Lipid Panel w/o Chol/HDL Ratio   Elevated hemoglobin A1c measurement   History of elevation in A1c -- will recheck today and monitor, 5.7% today.  Has been stable for years in 5 range.      Relevant Orders   Bayer DCA Hb A1c Waived   Bipolar disorder, current episode manic without psychotic features, moderate (HCC)   Chronic, stable.  Denies SI/HI.  Continue collaboration with psychiatry and regimen as prescribed by them.   Recent note reviewed, will obtain labs requested by psychiatry and alert them to results.      Relevant Orders   Carbamazepine  Level (Tegretol ), total   CBC with Differential/Platelet     Follow up plan: Return in about 6 months (around 04/22/2024) for HTN/HLD, IFG, BPH, Bipolar disorder, CKD.

## 2023-10-21 NOTE — Assessment & Plan Note (Signed)
Chronic, stable.  Will continue Propranolol, refills up to date, and continue collaboration with neurology as needed.  Recent note reviewed.

## 2023-10-21 NOTE — Assessment & Plan Note (Signed)
 Chronic, stable with Protonix .  Continue current regimen and consider trial reduction in future, if tolerated would consider discontinuation.  Risks of PPI use were discussed with patient including bone loss, C. Diff diarrhea, pneumonia, infections, CKD, electrolyte abnormalities.  Verbalizes understanding and chooses to continue the medication. Mag level today, obtain next visit.

## 2023-10-21 NOTE — Assessment & Plan Note (Signed)
 Chronic, ongoing.  Continue current medication regimen, recommend he restart, and adjust as needed.  CMP and lipid panel today.  Monitor closely as Tegretol can reduce effect of Rosuvastatin. Return in 6 months.

## 2023-10-21 NOTE — Assessment & Plan Note (Signed)
 Chronic, stable with no symptoms at this time.  Last PSA 3.9 in December 2023 -- normal for age and ethnicity.  Continue current medication regimen and adjust as needed.  Refills sent in.

## 2023-10-22 ENCOUNTER — Ambulatory Visit: Payer: Self-pay | Admitting: Nurse Practitioner

## 2023-10-22 LAB — LIPID PANEL W/O CHOL/HDL RATIO

## 2023-10-22 NOTE — Progress Notes (Signed)
 Contacted via MyChart  Good day Tony Fowler, some labs have returned and some are still pending.  Tegretol  level is stable and CBC overall reassuring.  Waiting on lipid panel and kidney/liver function results.  If abnormal I will let you know:) Have a great day!! Keep being amazing!!  Thank you for allowing me to participate in your care.  I appreciate you. Kindest regards, Fatimata Talsma

## 2023-10-23 LAB — CBC WITH DIFFERENTIAL/PLATELET
Basophils Absolute: 0.1 x10E3/uL (ref 0.0–0.2)
Basos: 1 %
EOS (ABSOLUTE): 0.2 x10E3/uL (ref 0.0–0.4)
Eos: 3 %
Hematocrit: 45.7 % (ref 37.5–51.0)
Hemoglobin: 15.2 g/dL (ref 13.0–17.7)
Immature Grans (Abs): 0 x10E3/uL (ref 0.0–0.1)
Immature Granulocytes: 0 %
Lymphocytes Absolute: 1.5 x10E3/uL (ref 0.7–3.1)
Lymphs: 24 %
MCH: 32 pg (ref 26.6–33.0)
MCHC: 33.3 g/dL (ref 31.5–35.7)
MCV: 96 fL (ref 79–97)
Monocytes Absolute: 0.4 x10E3/uL (ref 0.1–0.9)
Monocytes: 7 %
Neutrophils Absolute: 4 x10E3/uL (ref 1.4–7.0)
Neutrophils: 65 %
Platelets: 238 x10E3/uL (ref 150–450)
RBC: 4.75 x10E6/uL (ref 4.14–5.80)
RDW: 13.3 % (ref 11.6–15.4)
WBC: 6.1 x10E3/uL (ref 3.4–10.8)

## 2023-10-23 LAB — COMPREHENSIVE METABOLIC PANEL WITH GFR
ALT: 26 IU/L (ref 0–44)
AST: 32 IU/L (ref 0–40)
Albumin: 4.4 g/dL (ref 3.8–4.8)
Alkaline Phosphatase: 116 IU/L (ref 44–121)
BUN/Creatinine Ratio: 15 (ref 10–24)
BUN: 21 mg/dL (ref 8–27)
Bilirubin Total: 0.4 mg/dL (ref 0.0–1.2)
CO2: 20 mmol/L (ref 20–29)
Calcium: 10.1 mg/dL (ref 8.6–10.2)
Chloride: 103 mmol/L (ref 96–106)
Creatinine, Ser: 1.37 mg/dL — AB (ref 0.76–1.27)
Globulin, Total: 2.7 g/dL (ref 1.5–4.5)
Glucose: 100 mg/dL — AB (ref 70–99)
Potassium: 5 mmol/L (ref 3.5–5.2)
Sodium: 141 mmol/L (ref 134–144)
Total Protein: 7.1 g/dL (ref 6.0–8.5)
eGFR: 53 mL/min/1.73 — AB (ref >59)

## 2023-10-23 LAB — CARBAMAZEPINE LEVEL, TOTAL: Carbamazepine (Tegretol), S: 9.2 ug/mL (ref 4.0–12.0)

## 2023-10-23 LAB — LIPID PANEL W/O CHOL/HDL RATIO
Cholesterol, Total: 217 mg/dL — AB (ref 100–199)
HDL: 58 mg/dL (ref >39)
LDL Chol Calc (NIH): 134 mg/dL — AB (ref 0–99)
Triglycerides: 139 mg/dL (ref 0–149)
VLDL Cholesterol Cal: 25 mg/dL (ref 5–40)

## 2023-10-23 NOTE — Progress Notes (Signed)
Contacted via MyChart

## 2023-12-03 DIAGNOSIS — R051 Acute cough: Secondary | ICD-10-CM | POA: Diagnosis not present

## 2023-12-03 DIAGNOSIS — J189 Pneumonia, unspecified organism: Secondary | ICD-10-CM | POA: Diagnosis not present

## 2023-12-25 DIAGNOSIS — Z23 Encounter for immunization: Secondary | ICD-10-CM | POA: Diagnosis not present

## 2024-01-29 ENCOUNTER — Encounter: Payer: Self-pay | Admitting: Psychiatry

## 2024-01-29 ENCOUNTER — Ambulatory Visit (INDEPENDENT_AMBULATORY_CARE_PROVIDER_SITE_OTHER): Admitting: Psychiatry

## 2024-01-29 ENCOUNTER — Other Ambulatory Visit: Payer: Self-pay

## 2024-01-29 VITALS — BP 175/105 | HR 67 | Temp 97.5°F | Ht 67.0 in | Wt 166.8 lb

## 2024-01-29 DIAGNOSIS — Z79899 Other long term (current) drug therapy: Secondary | ICD-10-CM

## 2024-01-29 DIAGNOSIS — F319 Bipolar disorder, unspecified: Secondary | ICD-10-CM

## 2024-01-29 MED ORDER — CARBAMAZEPINE 200 MG PO TABS: 200.0000 mg | ORAL_TABLET | Freq: Three times a day (TID) | ORAL | 3 refills | Status: AC

## 2024-01-29 MED ORDER — ARIPIPRAZOLE 5 MG PO TABS: 5.0000 mg | ORAL_TABLET | Freq: Every day | ORAL | 3 refills | Status: AC

## 2024-01-29 NOTE — Patient Instructions (Signed)
 Continue carbamazepine  200 mg three times a day  Continue Abilify  5 mg daily  Obtain lab (carbamazepine , CBC, LFT) at your primary care visit Next appointment: 2/19 at 4:30

## 2024-01-29 NOTE — Progress Notes (Addendum)
 BH MD/PA/NP OP Progress Note  01/29/2024 5:06 PM Tony Fowler  MRN:  [id number]  Chief Complaint:  Chief Complaint  Patient presents with   Follow-up   HPI:  This is a follow-up appointment for bipolar disorder.  He states that he has been doing good for his age.  He had a good birthday with his family.  He has been helping his daughter and great granddaughter.  His mood has been great and he is not worried about it.  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  This keeps him positive and feels content.  He states that he might be the most positive person in his family.  He sleeps 5.5 hours.  He has been sleeping a little less due to nocturia.  He denies feeling depressed or anxiety.  He denies SI, HI, hallucinations.  He denies any dizziness or falls since the previous visit.  He agrees with the plans as outlined.SABRA   HBP 136/85,   Wt Readings from Last 3 Encounters:  01/29/24 166 lb 12.8 oz (75.7 kg)  10/21/23 166 lb 12.8 oz (75.7 kg)  10/02/23 165 lb 6.4 oz (75 kg)     Household: daughter (64), her fiance Marital status: divorced in 1991 (he states that his wife left due to his bipolar disorder.  She was recently found to have uterine cancer.) Number of children: 2 daughters, grandchildren (age 21-33 in 2023), great grandchildren  Employment: SSD due to bipolar disorder Education: graduate school for one year, [place] and [place]. 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 He moved to [place] in 2010 to live close to his two daughters.   Visit Diagnosis:    ICD-10-CM   1. Bipolar I disorder (HCC)  F31.9     2. High risk medication use  Z79.899       Past Psychiatric History: Please see initial evaluation for full details. I have reviewed the history. No updates at this time.     Past Medical History:  Past Medical History:  Diagnosis Date    Bipolar 1 disorder (HCC)    Coarse tremors    Hypertension     Past Surgical History:  Procedure Laterality Date   TONSILECTOMY/ADENOIDECTOMY WITH MYRINGOTOMY      Family Psychiatric History: Please see initial evaluation for full details. I have reviewed the history. No updates at this time.     Family History:  Family History  Problem Relation Age of Onset   Schizophrenia Mother    Bipolar disorder Mother    Heart disease Mother    Hypertension Mother    Mental illness Mother    Heart disease Brother    Hypertension Brother     Social History:  Social History   Socioeconomic History   Marital status: Divorced    Spouse name: Not on file   Number of children: 2   Years of education: Not on file   Highest education level: Bachelor's degree (e.g., BA, AB, BS)  Occupational History   Occupation: disability/retired  Tobacco Use   Smoking status: Former    Current packs/day: 0.00    Average packs/day: 0.5 packs/day for 52.2 years (26.1 ttl pk-yrs)    Types: Cigarettes    Start date: 73    Quit date: 06/13/2018    Years since quitting: 5.6  Smokeless tobacco: Never  Vaping Use   Vaping status: Never Used  Substance and Sexual Activity   Alcohol use: Yes    Comment: 1 beer occasionally monthly or less   Drug use: No   Sexual activity: Not Currently  Other Topics Concern   Not on file  Social History Narrative   Not on file   Social Drivers of Health   Financial Resource Strain: Low Risk  (10/20/2023)   Overall Financial Resource Strain (CARDIA)    Difficulty of Paying Living Expenses: Not hard at all  Food Insecurity: No Food Insecurity (10/20/2023)   Hunger Vital Sign    Worried About Running Out of Food in the Last Year: Never true    Ran Out of Food in the Last Year: Never true  Transportation Needs: No Transportation Needs (10/20/2023)   PRAPARE - Administrator, Civil Service (Medical): No    Lack of Transportation (Non-Medical): No   Physical Activity: Insufficiently Active (10/20/2023)   Exercise Vital Sign    Days of Exercise per Week: 3 days    Minutes of Exercise per Session: 20 min  Stress: No Stress Concern Present (10/20/2023)   [employer] of Occupational Health - Occupational Stress Questionnaire    Feeling of Stress: Not at all  Social Connections: Socially Isolated (10/20/2023)   Social Connection and Isolation Panel    Frequency of Communication with Friends and Family: More than three times a week    Frequency of Social Gatherings with Friends and Family: More than three times a week    Attends Religious Services: Patient declined    Database administrator or Organizations: No    Attends Engineer, structural: Not on file    Marital Status: Divorced    Allergies:  Allergies  Allergen Reactions   Penicillins Rash    Childhood allergy, more than 30 years ago. No hospital required.    Metabolic Disorder Labs: Lab Results  Component Value Date   HGBA1C 5.7 (H) 10/21/2023   Lab Results  Component Value Date   PROLACTIN 10.4 09/26/2021   Lab Results  Component Value Date   CHOL 217 (H) 10/21/2023   TRIG 139 10/21/2023   HDL 58 10/21/2023   CHOLHDL 3.5 03/14/2021   VLDL 18 01/02/2017   LDLCALC 134 (H) 10/21/2023   LDLCALC 228 (H) 04/22/2023   Lab Results  Component Value Date   TSH 0.789 04/22/2023   TSH 0.968 04/17/2022    Therapeutic Level Labs: Lab Results  Component Value Date   LITHIUM  0.61 12/08/2015   LITHIUM  0.54 (L) 12/05/2015   Lab Results  Component Value Date   VALPROATE 43 (L) 06/25/2017   VALPROATE 94 04/20/2016   Lab Results  Component Value Date   CBMZ 9.2 10/21/2023   CBMZ 9.4 10/16/2022    Current Medications: Current Outpatient Medications  Medication Sig Dispense Refill   [START ON 02/13/2024] ARIPiprazole  (ABILIFY ) 5 MG tablet Take 1 tablet (5 mg total) by mouth daily. 30 tablet 3   aspirin  EC 81 MG tablet Take 81 mg by mouth daily.      [START ON 02/13/2024] carbamazepine  (TEGRETOL ) 200 MG tablet Take 1 tablet (200 mg total) by mouth 3 (three) times daily. 90 tablet 3   gabapentin  (NEURONTIN ) 300 MG capsule 1 in the AM, 1 in the afternoon, and 2 at bedtime     Multiple Vitamins-Minerals (ONE DAILY CALCIUM /IRON) TABS Take by mouth.     omeprazole  (PRILOSEC) 40 MG  capsule Take 1 capsule (40 mg total) by mouth daily. 90 capsule 3   propranolol  (INDERAL ) 10 MG tablet Take 1 tablet (10 mg total) by mouth 3 (three) times daily. 270 tablet 3   rosuvastatin  (CRESTOR ) 20 MG tablet Take 1 tablet (20 mg total) by mouth daily. 90 tablet 3   tamsulosin  (FLOMAX ) 0.4 MG CAPS capsule Take 1 capsule (0.4 mg total) by mouth daily. 90 capsule 4   No current facility-administered medications for this visit.     Musculoskeletal: Strength & Muscle Tone: within normal limits Gait & Station: uses a cane Patient leans: N/A  Psychiatric Specialty Exam: Review of Systems  Psychiatric/Behavioral: Negative.    All other systems reviewed and are negative.   Blood pressure (!) 175/105, pulse 67, temperature (!) 97.5 F (36.4 C), temperature source Temporal, height 5' 7 (1.702 m), weight 166 lb 12.8 oz (75.7 kg).Body mass index is 26.12 kg/m.  General Appearance: Well Groomed  Eye Contact:  Good  Speech:  Clear and Coherent  Volume:  Normal  Mood:  good  Affect:  Appropriate, Congruent, and Full Range  Thought Process:  Coherent  Orientation:  Full (Time, Place, and Person)  Thought Content: Logical   Suicidal Thoughts:  No  Homicidal Thoughts:  No  Memory:  Immediate;   Good  Judgement:  Good  Insight:  Good  Psychomotor Activity:  Normal  Concentration:  Concentration: Good and Attention Span: Good  Recall:  Good  Fund of Knowledge: Good  Language: Good  Akathisia:  No  Handed:  Right  AIMS (if indicated): not done  Assets:  Communication Skills Desire for Improvement  ADL's:  Intact  Cognition: WNL  Sleep:  Good    Screenings: AIMS    Flowsheet Row Admission (Discharged) from 12/01/2015 in [facility]  AIMS Total Score 0   AUDIT    Flowsheet Row Admission (Discharged) from 12/01/2015 in [facility]ICINE  Alcohol Use Disorder Identification Test Final Score (AUDIT) 0   GAD-7    Flowsheet Row Office Visit from 10/21/2023 in [facility] Office Visit from 10/16/2022 in [facility] Office Visit from 04/17/2022 in [facility] Office Visit from 03/21/2022 in [facility] Office Visit from 01/22/2022 in [facility]  Total GAD-7 Score 0 4 0 0 0   PHQ2-9    Flowsheet Row Office Visit from 10/21/2023 in [facility] Clinical Support from 05/13/2023 in [facility] Office Visit from 10/16/2022 in [facility] Office Visit from 04/17/2022 in [facility] Office Visit from 03/21/2022 in [facility]  PHQ-2 Total Score 0 0 0 0 0  PHQ-9 Total Score 0 -- 0 0 0   Flowsheet Row Office Visit from 03/21/2022 in [facility] Office Visit from 01/22/2022 in [facility]  C-SSRS RISK CATEGORY No Risk Error: Q3, 4, or 5 should not be populated when Q2 is No     Assessment and Plan:  Iain Sawchuk is a 78 y.o. year old male with a history of bipolar I disorder, hypertension, GERD, CKD stage III, dug induced tremor, who presents for follow up appointment for below.  1. 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 no  mood symptoms since the last admission in 2017, previous admission x5 for mania, was under the care of [facility].  Originally on carbamazepine  200 mg TID, Abilfy 5 mg daily Stable.  He denies any mood symptoms since the last visit.  He remains connected with his family and expresses a strong sense of accomplishment in his life.  Will continue current dose of carbamazepine  and Abilify  to target bipolar disorder.   2. High risk medication use It was recommended to obtain blood test every 3 months.  He prefers to get it done at his next visit with his PCP.  He expressed understanding to ensure this monitoring.         Last checked  EKG HR 68, QTc31msec 04/2022  Lipid panels Chol 311 H, LDL 228 H 04/2023  HbA1c Glu 107 H 04/2023    Carbamazepine  9.2, CBC/LFT wnl 10/2023   # Hypertension No change. Although his blood pressure is consistently elevated at our office, he is not in acute distress, and his home BP is reportedly in normal range. He is under the care of his PCP.    Plan (he asks monthly refill only) Continue carbamazepine  200 mg three times a day (9.4 10/2022, CBC, LFT wnl 04/2023) Continue Abilify  5 mg daily  Obtain lab ( CBC, LFT) at PCP - recommended to obtain every three months Next appointment: 2/19 at 4:30, IP -on  propranolol  10 mg three times a day, gabapentin  300-300-600 mg for tremors   The patient demonstrates the following risk factors for suicide: Chronic risk factors for suicide include: psychiatric disorder of bipolar disorder . Acute risk factors for suicide include: unemployment. Protective factors for this patient include: positive social support, responsibility to others (children, family), coping skills, and hope for the future. Considering these factors, the overall suicide risk at this point appears to be low. Patient is appropriate for outpatient follow up.   Collaboration of Care: Collaboration of Care: Other reviewed notes in Epic   Patient/Guardian was advised Release of Information must be obtained prior to any record release in order to collaborate their care with an outside  provider. Patient/Guardian was advised if they have not already done so to contact the registration department to sign all necessary forms in order for us  to release information regarding their care.   Collaboration of Care: Collaboration of Care: Other reviewed notes in Epic  Patient/Guardian was advised Release of Information must be obtained prior to any record release in order to collaborate their care with an outside provider. Patient/Guardian was advised if they have not already done so to contact the registration department to sign all necessary forms in order for us  to release information regarding their care.   Consent: Patient/Guardian gives verbal consent for treatment and assignment of benefits for services provided during this visit. Patient/Guardian expressed understanding and agreed to proceed.    Katheren Sleet, MD 01/29/2024, 5:06 PM

## 2024-04-24 NOTE — Patient Instructions (Incomplete)
 Be Involved in Caring For Your Health:  Taking Medications When medications are taken as directed, they can greatly improve your health. But if they are not taken as prescribed, they may not work. In some cases, not taking them correctly can be harmful. To help ensure your treatment remains effective and safe, understand your medications and how to take them. Bring your medications to each visit for review by your provider.  Your lab results, notes, and after visit summary will be available on My Chart. We strongly encourage you to use this feature. If lab results are abnormal the clinic will contact you with the appropriate steps. If the clinic does not contact you assume the results are satisfactory. You can always view your results on My Chart. If you have questions regarding your health or results, please contact the clinic during office hours. You can also ask questions on My Chart.  We at [facility] are grateful that you chose [place] to provide your care. We strive to provide evidence-based and compassionate care and are always looking for feedback. If you get a survey from the clinic please complete this so we can hear your opinions.  Heart-Healthy Eating Plan Many factors influence your heart health, including eating and exercise habits. Heart health is also called coronary health. Coronary risk increases with abnormal blood fat (lipid) levels. A heart-healthy eating plan includes limiting unhealthy fats, increasing healthy fats, limiting salt (sodium) intake, and making other diet and lifestyle changes. What is my plan? Your health care provider may recommend that: You limit your fat intake to _________% or less of your total calories each day. You limit your saturated fat intake to _________% or less of your total calories each day. You limit the amount of cholesterol in your diet to less than _________ mg per day. You limit the amount of sodium in your diet to less than _________  mg per day. What are tips for following this plan? Cooking Cook foods using methods other than frying. Baking, boiling, grilling, and broiling are all good options. Other ways to reduce fat include: Removing the skin from poultry. Removing all visible fats from meats. Steaming vegetables in water or broth. Meal planning  At meals, imagine dividing your plate into fourths: Fill one-half of your plate with vegetables and green salads. Fill one-fourth of your plate with whole grains. Fill one-fourth of your plate with lean protein foods. Eat 2-4 cups of vegetables per day. One cup of vegetables equals 1 cup (91 g) broccoli or cauliflower florets, 2 medium carrots, 1 large bell pepper, 1 large sweet potato, 1 large tomato, 1 medium white potato, 2 cups (150 g) raw leafy greens. Eat 1-2 cups of fruit per day. One cup of fruit equals 1 small apple, 1 large banana, 1 cup (237 g) mixed fruit, 1 large orange,  cup (82 g) dried fruit, 1 cup (240 mL) 100% fruit juice. Eat more foods that contain soluble fiber. Examples include apples, broccoli, carrots, beans, peas, and barley. Aim to get 25-30 g of fiber per day. Increase your consumption of legumes, nuts, and seeds to 4-5 servings per week. One serving of dried beans or legumes equals  cup (90 g) cooked, 1 serving of nuts is  oz (12 almonds, 24 pistachios, or 7 walnut halves), and 1 serving of seeds equals  oz (8 g). Fats Choose healthy fats more often. Choose monounsaturated and polyunsaturated fats, such as olive and canola oils, avocado oil, flaxseeds, walnuts, almonds, and seeds. Eat  more omega-3 fats. Choose salmon, mackerel, sardines, tuna, flaxseed oil, and ground flaxseeds. Aim to eat fish at least 2 times each week. Check food labels carefully to identify foods with trans fats or high amounts of saturated fat. Limit saturated fats. These are found in animal products, such as meats, butter, and cream. Plant sources of saturated fats  include palm oil, palm kernel oil, and coconut oil. Avoid foods with partially hydrogenated oils in them. These contain trans fats. Examples are stick margarine, some tub margarines, cookies, crackers, and other baked goods. Avoid fried foods. General information Eat more home-cooked food and less restaurant, buffet, and fast food. Limit or avoid alcohol. Limit foods that are high in added sugar and simple starches such as foods made using white refined flour (white breads, pastries, sweets). Lose weight if you are overweight. Losing just 5-10% of your body weight can help your overall health and prevent diseases such as diabetes and heart disease. Monitor your sodium intake, especially if you have high blood pressure. Talk with your health care provider about your sodium intake. Try to incorporate more vegetarian meals weekly. What foods should I eat? Fruits All fresh, canned (in natural juice), or frozen fruits. Vegetables Fresh or frozen vegetables (raw, steamed, roasted, or grilled). Green salads. Grains Most grains. Choose whole wheat and whole grains most of the time. Rice and pasta, including brown rice and pastas made with whole wheat. Meats and other proteins Lean, well-trimmed beef, veal, pork, and lamb. Chicken and [place] without skin. All fish and shellfish. Wild duck, rabbit, pheasant, and venison. Egg whites or low-cholesterol egg substitutes. Dried beans, peas, lentils, and tofu. Seeds and most nuts. Dairy Low-fat or nonfat cheeses, including ricotta and mozzarella. Skim or 1% milk (liquid, powdered, or evaporated). Buttermilk made with low-fat milk. Nonfat or low-fat yogurt. Fats and oils Non-hydrogenated (trans-free) margarines. Vegetable oils, including soybean, sesame, sunflower, olive, avocado, peanut, safflower, corn, canola, and cottonseed. Salad dressings or mayonnaise made with a vegetable oil. Beverages Water (mineral or sparkling). Coffee and tea. Unsweetened ice  tea. Diet beverages. Sweets and desserts Sherbet, gelatin, and fruit ice. Small amounts of dark chocolate. Limit all sweets and desserts. Seasonings and condiments All seasonings and condiments. The items listed above may not be a complete list of foods and beverages you can eat. Contact a dietitian for more options. What foods should I avoid? Fruits Canned fruit in heavy syrup. Fruit in cream or butter sauce. Fried fruit. Limit coconut. Vegetables Vegetables cooked in cheese, cream, or butter sauce. Fried vegetables. Grains Breads made with saturated or trans fats, oils, or whole milk. Croissants. Sweet rolls. Donuts. High-fat crackers, such as cheese crackers and chips. Meats and other proteins Fatty meats, such as hot dogs, ribs, sausage, bacon, rib-eye roast or steak. High-fat deli meats, such as salami and bologna. Caviar. Domestic duck and goose. Organ meats, such as liver. Dairy Cream, sour cream, cream cheese, and creamed cottage cheese. Whole-milk cheeses. Whole or 2% milk (liquid, evaporated, or condensed). Whole buttermilk. Cream sauce or high-fat cheese sauce. Whole-milk yogurt. Fats and oils Meat fat, or shortening. Cocoa butter, hydrogenated oils, palm oil, coconut oil, palm kernel oil. Solid fats and shortenings, including bacon fat, salt pork, lard, and butter. Nondairy cream substitutes. Salad dressings with cheese or sour cream. Beverages Regular sodas and any drinks with added sugar. Sweets and desserts Frosting. Pudding. Cookies. Cakes. Pies. Milk chocolate or white chocolate. Buttered syrups. Full-fat ice cream or ice cream drinks. The items listed above may  not be a complete list of foods and beverages to avoid. Contact a dietitian for more information. Summary Heart-healthy meal planning includes limiting unhealthy fats, increasing healthy fats, limiting salt (sodium) intake and making other diet and lifestyle changes. Lose weight if you are overweight. Losing just  5-10% of your body weight can help your overall health and prevent diseases such as diabetes and heart disease. Focus on eating a balance of foods, including fruits and vegetables, low-fat or nonfat dairy, lean protein, nuts and legumes, whole grains, and heart-healthy oils and fats. This information is not intended to replace advice given to you by your health care provider. Make sure you discuss any questions you have with your health care provider. Document Revised: 04/30/2021 Document Reviewed: 04/30/2021 Elsevier Patient Education  2024 [employer].

## 2024-04-27 ENCOUNTER — Ambulatory Visit: Admitting: Nurse Practitioner

## 2024-04-27 ENCOUNTER — Encounter: Payer: Self-pay | Admitting: Nurse Practitioner

## 2024-04-27 VITALS — BP 118/82 | HR 67 | Temp 99.0°F | Ht 67.8 in | Wt 166.4 lb

## 2024-04-27 DIAGNOSIS — N183 Chronic kidney disease, stage 3 unspecified: Secondary | ICD-10-CM

## 2024-04-27 DIAGNOSIS — I10 Essential (primary) hypertension: Secondary | ICD-10-CM

## 2024-04-27 DIAGNOSIS — G251 Drug-induced tremor: Secondary | ICD-10-CM | POA: Diagnosis not present

## 2024-04-27 DIAGNOSIS — F3112 Bipolar disorder, current episode manic without psychotic features, moderate: Secondary | ICD-10-CM | POA: Diagnosis not present

## 2024-04-27 DIAGNOSIS — R7309 Other abnormal glucose: Secondary | ICD-10-CM

## 2024-04-27 DIAGNOSIS — K219 Gastro-esophageal reflux disease without esophagitis: Secondary | ICD-10-CM | POA: Diagnosis not present

## 2024-04-27 DIAGNOSIS — E782 Mixed hyperlipidemia: Secondary | ICD-10-CM | POA: Diagnosis not present

## 2024-04-27 DIAGNOSIS — Z87891 Personal history of nicotine dependence: Secondary | ICD-10-CM | POA: Diagnosis not present

## 2024-04-27 DIAGNOSIS — N1831 Chronic kidney disease, stage 3a: Secondary | ICD-10-CM

## 2024-04-27 LAB — BAYER DCA HB A1C WAIVED: HB A1C (BAYER DCA - WAIVED): 5.7 % — ABNORMAL HIGH (ref 4.8–5.6)

## 2024-04-27 LAB — MICROALBUMIN, URINE WAIVED
Creatinine, Urine Waived: 300 mg/dL (ref 10–300)
Microalb, Ur Waived: 150 mg/L — ABNORMAL HIGH (ref 0–19)

## 2024-04-27 NOTE — Progress Notes (Signed)
 "  BP 118/82 (BP Location: Left Arm, Patient Position: Sitting, Cuff Size: Normal)   Pulse 67   Temp 99 F (37.2 C) (Oral)   Ht 5' 7.8 (1.722 m)   Wt 166 lb 6.4 oz (75.5 kg)   SpO2 98%   BMI 25.45 kg/m    Subjective:    Patient ID: Tony Fowler, male    DOB: 1945-10-15, 78 y.o.   MRN: [id number]  HPI: Tony Fowler is a 79 y.o. male  Chief Complaint  Patient presents with   Chronic Kidney Disease   Hyperlipidemia   Hypertension   Gastroesophageal Reflux   HYPERTENSION / HYPERLIPIDEMIA Takes Propranolol  10 MG TID (tremor and BP), Crestor  40 MG daily, ASA. Saw neurology last for drug induced tremor on 09/25/23. Takes Gabapentin  300 MG QID and Propranolol  for this.  Quit smoking on March 7th, 2020. Had smoked since 79 years old, 1 PPD per day. Declines lung CT cancer screening.  Gets white coat with providers due to history of involuntary admissions to psychiatry in past. Satisfied with current treatment? yes Duration of hypertension: chronic BP monitoring frequency: daily, uses Omron BP range: on average in 130/80 range BP medication side effects: no Duration of hyperlipidemia: chronic Cholesterol medication side effects: no Cholesterol supplements: none Medication compliance: good compliance Aspirin : yes Recent stressors: no Recurrent headaches: no Visual changes: no Palpitations: no Dyspnea: no Chest pain: no Lower extremity edema: no Dizzy/lightheaded: no The 10-year ASCVD risk score (Arnett DK, et al., 2019) is: 27.6%   Values used to calculate the score:     Age: 40 years     Clinically relevant sex: Male     Is Non-Hispanic African American: No     Diabetic: No     Tobacco smoker: No     Systolic Blood Pressure: 118 mmHg     Is BP treated: Yes     HDL Cholesterol: 58 mg/dL     Total Cholesterol: 217 mg/dL  CHRONIC KIDNEY DISEASE (CKD 3a) Taking Tamsulosin  for BPH, refuses to start ARB for kidney protection have had multiple discussions with him on  this. Prefers not to add medications. Omeprazole  continues to be taken for GERD. CKD status: stable Medications renally dose: yes Previous renal evaluation: no Pneumovax:  Up to Date Influenza Vaccine:  Up To Date  Impaired Fasting Glucose Endorses high sugar diet. HbA1C:  Lab Results  Component Value Date   HGBA1C 5.7 (H) 10/21/2023  Duration of elevated blood sugar: years Polydipsia: no Polyuria: no Weight change: no Visual disturbance: no Glucose Monitoring: no    Accucheck frequency: Not Checking    Fasting glucose:     Post prandial:  Diabetic Education: Not Completed Family history of diabetes: no   BIPOLAR AFFECTIVE DISORDER: Takes Abilify  5 MG daily and Tegretol  200 MG TID. Last saw psychiatry on 10/02/23. To have LFT and CBC done every 3 months.  He report he will get labs at [facility] in between visits with PCP. Mood status: stable Satisfied with current treatment?: yes Symptom severity: mild  Duration of current treatment : chronic Side effects: no Medication compliance: good compliance Psychotherapy/counseling: current Depressed mood: no Anxious mood: no Anhedonia: no Significant weight loss or gain: no Insomnia: no Fatigue: no Feelings of worthlessness or guilt: no Impaired concentration/indecisiveness: no Suicidal ideations: no Hopelessness: no Crying spells: no    04/27/2024    9:21 AM 10/21/2023   10:01 AM 05/13/2023    9:21 AM 10/16/2022    9:49 AM 04/17/2022  8:30 AM  Depression screen PHQ 2/9  Decreased Interest 0 0 0 0 0  Down, Depressed, Hopeless 0 0 0 0 0  PHQ - 2 Score 0 0 0 0 0  Altered sleeping 0 0  0 0  Tired, decreased energy 0 0  0 0  Change in appetite 0 0  0 0  Feeling bad or failure about yourself  0 0  0 0  Trouble concentrating 0 0  0 0  Moving slowly or fidgety/restless 0 0  0 0  Suicidal thoughts 0 0  0 0  PHQ-9 Score 0 0   0  0   Difficult doing work/chores Not difficult at all Not difficult at all  Not difficult at all  Not difficult at all     Data saved with a previous flowsheet row definition       04/27/2024    9:22 AM 10/21/2023   10:01 AM 10/16/2022    9:49 AM 04/17/2022    8:31 AM  GAD 7 : Generalized Anxiety Score  Nervous, Anxious, on Edge 0 0  1  0   Control/stop worrying 0 0  1  0   Worry too much - different things 0 0  1  0   Trouble relaxing 0 0  0  0   Restless 0 0  0  0   Easily annoyed or irritable 0 0  1  0   Afraid - awful might happen 0 0  0  0   Total GAD 7 Score 0 0 4 0  Anxiety Difficulty Not difficult at all Not difficult at all Not difficult at all Not difficult at all     Data saved with a previous flowsheet row definition    Relevant past medical, surgical, family and social history reviewed and updated as indicated. Interim medical history since our last visit reviewed. Allergies and medications reviewed and updated.  Review of Systems  Constitutional:  Negative for activity change, diaphoresis, fatigue and fever.  Respiratory:  Negative for cough, chest tightness, shortness of breath and wheezing.   Cardiovascular:  Negative for chest pain, palpitations and leg swelling.  Gastrointestinal: Negative.   Endocrine: Negative.   Neurological: Negative.   Psychiatric/Behavioral: Negative.     Per HPI unless specifically indicated above     Objective:    BP 118/82 (BP Location: Left Arm, Patient Position: Sitting, Cuff Size: Normal)   Pulse 67   Temp 99 F (37.2 C) (Oral)   Ht 5' 7.8 (1.722 m)   Wt 166 lb 6.4 oz (75.5 kg)   SpO2 98%   BMI 25.45 kg/m   Wt Readings from Last 3 Encounters:  04/27/24 166 lb 6.4 oz (75.5 kg)  01/29/24 166 lb 12.8 oz (75.7 kg)  10/21/23 166 lb 12.8 oz (75.7 kg)    Physical Exam Vitals and nursing note reviewed.  Constitutional:      General: He is awake. He is not in acute distress.    Appearance: He is well-developed, well-groomed and overweight. He is not ill-appearing.  HENT:     Head: Normocephalic and atraumatic.      Right Ear: Hearing normal. No drainage.     Left Ear: Hearing normal. No drainage.  Eyes:     General: Lids are normal.        Right eye: No discharge.        Left eye: No discharge.     Conjunctiva/sclera: Conjunctivae normal.     Pupils:  Pupils are equal, round, and reactive to light.  Neck:     Thyroid : No thyromegaly.     Vascular: No carotid bruit.     Trachea: Trachea normal.  Cardiovascular:     Rate and Rhythm: Normal rate and regular rhythm.     Heart sounds: Normal heart sounds, S1 normal and S2 normal. No murmur heard.    No gallop.  Pulmonary:     Effort: Pulmonary effort is normal. No accessory muscle usage or respiratory distress.     Breath sounds: Normal breath sounds. No decreased breath sounds, wheezing or rales.  Abdominal:     General: Bowel sounds are normal.     Palpations: Abdomen is soft.  Musculoskeletal:        General: Normal range of motion.     Cervical back: Normal range of motion and neck supple.     Right lower leg: No edema.     Left lower leg: No edema.  Skin:    General: Skin is warm and dry.     Capillary Refill: Capillary refill takes less than 2 seconds.     Findings: No rash.  Neurological:     Mental Status: He is alert and oriented to person, place, and time.     Deep Tendon Reflexes: Reflexes are normal and symmetric.  Psychiatric:        Attention and Perception: Attention normal.        Mood and Affect: Mood normal.        Speech: Speech normal.        Behavior: Behavior normal. Behavior is cooperative.        Thought Content: Thought content normal.    Results for orders placed or performed in visit on 10/21/23  Bayer DCA Hb A1c Waived   Collection Time: 10/21/23 10:01 AM  Result Value Ref Range   HB A1C (BAYER DCA - WAIVED) 5.7 (H) 4.8 - 5.6 %  Carbamazepine  Level (Tegretol ), total   Collection Time: 10/21/23 10:04 AM  Result Value Ref Range   Carbamazepine  (Tegretol ), S 9.2 4.0 - 12.0 ug/mL  Comprehensive metabolic  panel with GFR   Collection Time: 10/21/23 10:04 AM  Result Value Ref Range   Glucose 100 (H) 70 - 99 mg/dL   BUN 21 8 - 27 mg/dL   Creatinine, Ser 8.62 (H) 0.76 - 1.27 mg/dL   eGFR 53 (L) >40 fO/fpw/8.26   BUN/Creatinine Ratio 15 10 - 24   Sodium 141 134 - 144 mmol/L   Potassium 5.0 3.5 - 5.2 mmol/L   Chloride 103 96 - 106 mmol/L   CO2 20 20 - 29 mmol/L   Calcium  10.1 8.6 - 10.2 mg/dL   Total Protein 7.1 6.0 - 8.5 g/dL   Albumin 4.4 3.8 - 4.8 g/dL   Globulin, Total 2.7 1.5 - 4.5 g/dL   Bilirubin Total 0.4 0.0 - 1.2 mg/dL   Alkaline Phosphatase 116 44 - 121 IU/L   AST 32 0 - 40 IU/L   ALT 26 0 - 44 IU/L  CBC with Differential/Platelet   Collection Time: 10/21/23 10:04 AM  Result Value Ref Range   WBC 6.1 3.4 - 10.8 x10E3/uL   RBC 4.75 4.14 - 5.80 x10E6/uL   Hemoglobin 15.2 13.0 - 17.7 g/dL   Hematocrit 54.2 62.4 - 51.0 %   MCV 96 79 - 97 fL   MCH 32.0 26.6 - 33.0 pg   MCHC 33.3 31.5 - 35.7 g/dL   RDW 86.6 88.3 - 84.5 %  Platelets 238 150 - 450 x10E3/uL   Neutrophils 65 Not Estab. %   Lymphs 24 Not Estab. %   Monocytes 7 Not Estab. %   Eos 3 Not Estab. %   Basos 1 Not Estab. %   Neutrophils Absolute 4.0 1.4 - 7.0 x10E3/uL   Lymphocytes Absolute 1.5 0.7 - 3.1 x10E3/uL   Monocytes Absolute 0.4 0.1 - 0.9 x10E3/uL   EOS (ABSOLUTE) 0.2 0.0 - 0.4 x10E3/uL   Basophils Absolute 0.1 0.0 - 0.2 x10E3/uL   Immature Granulocytes 0 Not Estab. %   Immature Grans (Abs) 0.0 0.0 - 0.1 x10E3/uL  Lipid Panel w/o Chol/HDL Ratio   Collection Time: 10/21/23 10:04 AM  Result Value Ref Range   Cholesterol, Total 217 (H) 100 - 199 mg/dL   Triglycerides 860 0 - 149 mg/dL   HDL 58 >60 mg/dL   VLDL Cholesterol Cal 25 5 - 40 mg/dL   LDL Chol Calc (NIH) 865 (H) 0 - 99 mg/dL      Assessment & Plan:   Problem List Items Addressed This Visit       Cardiovascular and Mediastinum   Benign hypertension (Chronic)   Chronic, ongoing.  Initial BP elevated, but repeat improved and well below goal  + home BP remains stable. Known white coat due to PTSD from past involuntary commitments and provider settings are stressful for him.  Home BP's average at goal for age, he checks regularly.  Have recommended adding on a low dose of ARB with his CKD, would avoid ACE as suspect some underlying COPD.  He refuses to start new medication.  Will continue Propranolol  for BP and tremor.  Recommend he monitor BP at home daily and document for provider visits + focus on DASH diet.  LABS: CBC, CMP.  Refills up to date.  Urine ALB 150 January 2026. Return in 6 months.      Relevant Orders   CBC with Differential/Platelet   Comprehensive metabolic panel with GFR   TSH     Digestive   GERD (gastroesophageal reflux disease)   Chronic, stable with Protonix .  Continue current regimen and consider trial reduction in future, if tolerated would consider discontinuation.  Risks of PPI use were discussed with patient including bone loss, C. Diff diarrhea, pneumonia, infections, CKD, electrolyte abnormalities.  Verbalizes understanding and chooses to continue the medication. Mag level today.      Relevant Orders   Magnesium      Nervous and Auditory   Drug-induced tremor   Chronic, stable.  Will continue Propranolol , refills up to date, and continue collaboration with neurology as needed.  Recent note reviewed.        Genitourinary   CKD (chronic kidney disease) stage 3, GFR 30-59 ml/min (HCC)   Chronic, ongoing. Remaining stable on recent labs. Recommend Losartan or alternate ARB addition for kidney protection.  He refuses to add or change medications, he is very adamant about this.  Recheck CMP. Urine ALB 150 January 2026.      Relevant Orders   Microalbumin, Urine Waived   CBC with Differential/Platelet   Comprehensive metabolic panel with GFR     Other   Mixed hyperlipidemia (Chronic)   Chronic, ongoing.  Continue current medication regimen and adjust as needed.  CMP and lipid panel today.  Monitor  closely as Tegretol  can reduce effect of Rosuvastatin . Return in 6 months.      Relevant Orders   Comprehensive metabolic panel with GFR   Lipid Panel w/o Chol/HDL Ratio  History of smoking 10-25 pack years   Quit in March 2020 after PNA.  Recommend continued cessation.  Discussed at length CT lung CA screening with him and provided pamphlet on this for review, recommend he obtain this screening.  He is not interested.  Suspect some underlying COPD present.      Elevated hemoglobin A1c measurement   History of elevation in A1c -- will recheck today and monitor, 5.7% today.  Has been stable for years in 5 range. Urine ALB 150 January 2026.      Relevant Orders   Bayer DCA Hb A1c Waived   Microalbumin, Urine Waived   Bipolar disorder, current episode manic without psychotic features, moderate (HCC) - Primary   Chronic, stable.  Denies SI/HI.  Continue collaboration with psychiatry and regimen as prescribed by them.   Recent note reviewed, will obtain labs requested by psychiatry and alert them to results. He reports he will get 3 month LFT and CBC at [facility] in between PCP visits.      Relevant Orders   CBC with Differential/Platelet     Follow up plan: Return in about 6 months (around 10/25/2024) for HTN/HLD, BIPOLAR, CKD, IFG. "

## 2024-04-27 NOTE — Assessment & Plan Note (Signed)
 Chronic, ongoing. Remaining stable on recent labs. Recommend Losartan or alternate ARB addition for kidney protection.  He refuses to add or change medications, he is very adamant about this.  Recheck CMP. Urine ALB 150 January 2026.

## 2024-04-27 NOTE — Assessment & Plan Note (Signed)
Chronic, stable.  Will continue Propranolol, refills up to date, and continue collaboration with neurology as needed.  Recent note reviewed.

## 2024-04-27 NOTE — Assessment & Plan Note (Signed)
 History of elevation in A1c -- will recheck today and monitor, 5.7% today.  Has been stable for years in 5 range. Urine ALB 150 January 2026.

## 2024-04-27 NOTE — Assessment & Plan Note (Signed)
Quit in March 2020 after PNA.  Recommend continued cessation.  Discussed at length CT lung CA screening with him and provided pamphlet on this for review, recommend he obtain this screening.  He is not interested.  Suspect some underlying COPD present. 

## 2024-04-27 NOTE — Assessment & Plan Note (Signed)
 Chronic, stable with Protonix .  Continue current regimen and consider trial reduction in future, if tolerated would consider discontinuation.  Risks of PPI use were discussed with patient including bone loss, C. Diff diarrhea, pneumonia, infections, CKD, electrolyte abnormalities.  Verbalizes understanding and chooses to continue the medication. Mag level today.

## 2024-04-27 NOTE — Assessment & Plan Note (Signed)
 Chronic, stable.  Denies SI/HI.  Continue collaboration with psychiatry and regimen as prescribed by them.   Recent note reviewed, will obtain labs requested by psychiatry and alert them to results. He reports he will get 3 month LFT and CBC at [facility] in between PCP visits.

## 2024-04-27 NOTE — Assessment & Plan Note (Signed)
 Chronic, ongoing.  Initial BP elevated, but repeat improved and well below goal + home BP remains stable. Known white coat due to PTSD from past involuntary commitments and provider settings are stressful for him.  Home BP's average at goal for age, he checks regularly.  Have recommended adding on a low dose of ARB with his CKD, would avoid ACE as suspect some underlying COPD.  He refuses to start new medication.  Will continue Propranolol  for BP and tremor.  Recommend he monitor BP at home daily and document for provider visits + focus on DASH diet.  LABS: CBC, CMP.  Refills up to date.  Urine ALB 150 January 2026. Return in 6 months.

## 2024-04-27 NOTE — Assessment & Plan Note (Signed)
 Chronic, ongoing.  Continue current medication regimen and adjust as needed.  CMP and lipid panel today.  Monitor closely as Tegretol  can reduce effect of Rosuvastatin . Return in 6 months.

## 2024-04-28 ENCOUNTER — Ambulatory Visit: Payer: Self-pay | Admitting: Nurse Practitioner

## 2024-04-28 LAB — CBC WITH DIFFERENTIAL/PLATELET
Basophils Absolute: 0 x10E3/uL (ref 0.0–0.2)
Basos: 1 %
EOS (ABSOLUTE): 0.1 x10E3/uL (ref 0.0–0.4)
Eos: 2 %
Hematocrit: 46.5 % (ref 37.5–51.0)
Hemoglobin: 15.5 g/dL (ref 13.0–17.7)
Immature Grans (Abs): 0 x10E3/uL (ref 0.0–0.1)
Immature Granulocytes: 0 %
Lymphocytes Absolute: 1.6 x10E3/uL (ref 0.7–3.1)
Lymphs: 19 %
MCH: 32 pg (ref 26.6–33.0)
MCHC: 33.3 g/dL (ref 31.5–35.7)
MCV: 96 fL (ref 79–97)
Monocytes Absolute: 0.6 x10E3/uL (ref 0.1–0.9)
Monocytes: 7 %
Neutrophils Absolute: 6 x10E3/uL (ref 1.4–7.0)
Neutrophils: 71 %
Platelets: 242 x10E3/uL (ref 150–450)
RBC: 4.84 x10E6/uL (ref 4.14–5.80)
RDW: 12.9 % (ref 11.6–15.4)
WBC: 8.4 x10E3/uL (ref 3.4–10.8)

## 2024-04-28 LAB — COMPREHENSIVE METABOLIC PANEL WITH GFR
ALT: 23 IU/L (ref 0–44)
AST: 30 IU/L (ref 0–40)
Albumin: 4.5 g/dL (ref 3.8–4.8)
Alkaline Phosphatase: 111 IU/L (ref 47–123)
BUN/Creatinine Ratio: 14 (ref 10–24)
BUN: 18 mg/dL (ref 8–27)
Bilirubin Total: 0.4 mg/dL (ref 0.0–1.2)
CO2: 23 mmol/L (ref 20–29)
Calcium: 9.8 mg/dL (ref 8.6–10.2)
Chloride: 103 mmol/L (ref 96–106)
Creatinine, Ser: 1.32 mg/dL — ABNORMAL HIGH (ref 0.76–1.27)
Globulin, Total: 2.3 g/dL (ref 1.5–4.5)
Glucose: 109 mg/dL — ABNORMAL HIGH (ref 70–99)
Potassium: 4.4 mmol/L (ref 3.5–5.2)
Sodium: 141 mmol/L (ref 134–144)
Total Protein: 6.8 g/dL (ref 6.0–8.5)
eGFR: 56 mL/min/1.73 — ABNORMAL LOW

## 2024-04-28 LAB — TSH: TSH: 1.01 u[IU]/mL (ref 0.450–4.500)

## 2024-04-28 LAB — LIPID PANEL W/O CHOL/HDL RATIO
Cholesterol, Total: 208 mg/dL — ABNORMAL HIGH (ref 100–199)
HDL: 61 mg/dL
LDL Chol Calc (NIH): 122 mg/dL — ABNORMAL HIGH (ref 0–99)
Triglycerides: 141 mg/dL (ref 0–149)
VLDL Cholesterol Cal: 25 mg/dL (ref 5–40)

## 2024-04-28 LAB — MAGNESIUM: Magnesium: 2 mg/dL (ref 1.6–2.3)

## 2024-04-28 MED ORDER — ROSUVASTATIN CALCIUM 40 MG PO TABS: 40.0000 mg | ORAL_TABLET | Freq: Every day | ORAL | 3 refills | Status: AC

## 2024-04-28 NOTE — Progress Notes (Signed)
 Contacted via MyChart  Good morning Tony Fowler, your labs have returned: - Kidney function, creatinine and eGFR, continues to show Stage 3a kidney disease with no worsening. We will continue to monitor. Remainder of CMP is normal. - Lipid panel continues to show elevations, I do recommend going up to 40 MG on your Rosuvastatin . Thoughts on this? If okay with it I can send it in. - Remainder of labs are stable. Any questions? Keep being stellar!!  Thank you for allowing me to participate in your care.  I appreciate you. Kindest regards, Mildred Tuccillo

## 2024-05-27 ENCOUNTER — Ambulatory Visit: Admitting: Psychiatry

## 2024-11-02 ENCOUNTER — Ambulatory Visit: Admitting: Nurse Practitioner
